# Patient Record
Sex: Male | Born: 1980 | Race: Black or African American | Hispanic: No | Marital: Single
Health system: Southern US, Academic
[De-identification: ages and names within clinical notes are randomized; demographics above are authoritative.]

## PROBLEM LIST (undated history)

## (undated) ENCOUNTER — Ambulatory Visit

## (undated) ENCOUNTER — Encounter

## (undated) DIAGNOSIS — R079 Chest pain, unspecified: Secondary | ICD-10-CM

## (undated) DIAGNOSIS — B20 Human immunodeficiency virus [HIV] disease: Secondary | ICD-10-CM

## (undated) DIAGNOSIS — T7840XA Allergy, unspecified, initial encounter: Secondary | ICD-10-CM

## (undated) DIAGNOSIS — Z8249 Family history of ischemic heart disease and other diseases of the circulatory system: Secondary | ICD-10-CM

## (undated) DIAGNOSIS — F419 Anxiety disorder, unspecified: Secondary | ICD-10-CM

## (undated) DIAGNOSIS — K5900 Constipation, unspecified: Secondary | ICD-10-CM

## (undated) DIAGNOSIS — G4730 Sleep apnea, unspecified: Secondary | ICD-10-CM

## (undated) DIAGNOSIS — Z91018 Allergy to other foods: Secondary | ICD-10-CM

## (undated) DIAGNOSIS — E739 Lactose intolerance, unspecified: Secondary | ICD-10-CM

## (undated) DIAGNOSIS — M549 Dorsalgia, unspecified: Secondary | ICD-10-CM

## (undated) DIAGNOSIS — E786 Lipoprotein deficiency: Secondary | ICD-10-CM

## (undated) DIAGNOSIS — R7303 Prediabetes: Secondary | ICD-10-CM

## (undated) DIAGNOSIS — D573 Sickle-cell trait: Secondary | ICD-10-CM

## (undated) DIAGNOSIS — I10 Essential (primary) hypertension: Secondary | ICD-10-CM

## (undated) DIAGNOSIS — F1010 Alcohol abuse, uncomplicated: Secondary | ICD-10-CM

## (undated) DIAGNOSIS — J302 Other seasonal allergic rhinitis: Secondary | ICD-10-CM

## (undated) DIAGNOSIS — E7800 Pure hypercholesterolemia, unspecified: Secondary | ICD-10-CM

## (undated) HISTORY — DX: Anxiety disorder, unspecified: F41.9

## (undated) HISTORY — DX: Prediabetes: R73.03

## (undated) HISTORY — DX: Constipation, unspecified: K59.00

## (undated) HISTORY — DX: Dorsalgia, unspecified: M54.9

## (undated) HISTORY — DX: Alcohol abuse, uncomplicated: F10.10

## (undated) HISTORY — DX: Pure hypercholesterolemia, unspecified: E78.00

## (undated) HISTORY — DX: Human immunodeficiency virus (HIV) disease: B20

## (undated) HISTORY — DX: Other seasonal allergic rhinitis: J30.2

## (undated) HISTORY — DX: Sleep apnea, unspecified: G47.30

## (undated) HISTORY — DX: Lactose intolerance, unspecified: E73.9

## (undated) HISTORY — DX: Chest pain, unspecified: R07.9

## (undated) HISTORY — DX: Lipoprotein deficiency: E78.6

## (undated) HISTORY — DX: Family history of ischemic heart disease and other diseases of the circulatory system: Z82.49

## (undated) HISTORY — DX: Sickle-cell trait: D57.3

## (undated) HISTORY — DX: Allergy, unspecified, initial encounter: T78.40XA

## (undated) HISTORY — DX: Allergy to other foods: Z91.018

---
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
## 2002-08-25 ENCOUNTER — Emergency Department (HOSPITAL_COMMUNITY): Admission: EM | Admit: 2002-08-25 | Discharge: 2002-08-25 | Payer: Self-pay | Admitting: Emergency Medicine

## 2002-08-25 ENCOUNTER — Encounter: Payer: Self-pay | Admitting: Emergency Medicine

## 2002-11-25 DIAGNOSIS — I10 Essential (primary) hypertension: Secondary | ICD-10-CM

## 2002-11-25 HISTORY — DX: Essential (primary) hypertension: I10

## 2004-01-02 ENCOUNTER — Emergency Department (HOSPITAL_COMMUNITY): Admission: EM | Admit: 2004-01-02 | Discharge: 2004-01-03 | Payer: Self-pay | Admitting: Emergency Medicine

## 2004-01-21 ENCOUNTER — Emergency Department (HOSPITAL_COMMUNITY): Admission: AD | Admit: 2004-01-21 | Discharge: 2004-01-21 | Payer: Self-pay | Admitting: Family Medicine

## 2004-04-24 ENCOUNTER — Emergency Department (HOSPITAL_COMMUNITY): Admission: EM | Admit: 2004-04-24 | Discharge: 2004-04-24 | Payer: Self-pay | Admitting: Family Medicine

## 2005-01-28 ENCOUNTER — Emergency Department (HOSPITAL_COMMUNITY): Admission: EM | Admit: 2005-01-28 | Discharge: 2005-01-28 | Payer: Self-pay | Admitting: Family Medicine

## 2005-11-19 ENCOUNTER — Emergency Department (HOSPITAL_COMMUNITY): Admission: EM | Admit: 2005-11-19 | Discharge: 2005-11-19 | Payer: Self-pay | Admitting: Family Medicine

## 2005-11-20 ENCOUNTER — Emergency Department (HOSPITAL_COMMUNITY): Admission: EM | Admit: 2005-11-20 | Discharge: 2005-11-20 | Payer: Self-pay | Admitting: Family Medicine

## 2006-03-07 ENCOUNTER — Emergency Department (HOSPITAL_COMMUNITY): Admission: EM | Admit: 2006-03-07 | Discharge: 2006-03-07 | Payer: Self-pay | Admitting: Emergency Medicine

## 2006-11-08 ENCOUNTER — Emergency Department (HOSPITAL_COMMUNITY): Admission: EM | Admit: 2006-11-08 | Discharge: 2006-11-08 | Payer: Self-pay | Admitting: Family Medicine

## 2006-12-31 ENCOUNTER — Emergency Department (HOSPITAL_COMMUNITY): Admission: EM | Admit: 2006-12-31 | Discharge: 2006-12-31 | Payer: Self-pay | Admitting: Family Medicine

## 2007-06-15 ENCOUNTER — Emergency Department (HOSPITAL_COMMUNITY): Admission: EM | Admit: 2007-06-15 | Discharge: 2007-06-15 | Payer: Self-pay | Admitting: Emergency Medicine

## 2007-08-19 ENCOUNTER — Emergency Department (HOSPITAL_COMMUNITY): Admission: AC | Admit: 2007-08-19 | Discharge: 2007-08-20 | Payer: Self-pay

## 2007-10-09 ENCOUNTER — Emergency Department (HOSPITAL_COMMUNITY): Admission: EM | Admit: 2007-10-09 | Discharge: 2007-10-09 | Payer: Self-pay | Admitting: Emergency Medicine

## 2007-12-19 ENCOUNTER — Emergency Department (HOSPITAL_COMMUNITY): Admission: EM | Admit: 2007-12-19 | Discharge: 2007-12-19 | Payer: Self-pay | Admitting: Emergency Medicine

## 2008-10-25 ENCOUNTER — Emergency Department (HOSPITAL_COMMUNITY): Admission: EM | Admit: 2008-10-25 | Discharge: 2008-10-25 | Payer: Self-pay | Admitting: Emergency Medicine

## 2009-02-24 ENCOUNTER — Emergency Department (HOSPITAL_COMMUNITY): Admission: EM | Admit: 2009-02-24 | Discharge: 2009-02-24 | Payer: Self-pay | Admitting: Family Medicine

## 2010-07-17 ENCOUNTER — Ambulatory Visit: Payer: Self-pay | Admitting: Family Medicine

## 2011-01-24 ENCOUNTER — Inpatient Hospital Stay (INDEPENDENT_AMBULATORY_CARE_PROVIDER_SITE_OTHER)
Admission: RE | Admit: 2011-01-24 | Discharge: 2011-01-24 | Disposition: A | Discharge status: Home / Self Care | Payer: BC Managed Care – PPO | Source: Ambulatory Visit | Attending: Family Medicine | Admitting: Family Medicine

## 2011-01-24 DIAGNOSIS — S335XXA Sprain of ligaments of lumbar spine, initial encounter: Secondary | ICD-10-CM
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
## 2011-09-03 LAB — POCT URINALYSIS DIP (DEVICE)
Glucose, UA: NEGATIVE
Ketones, ur: NEGATIVE
Operator id: 239701
Protein, ur: NEGATIVE
Specific Gravity, Urine: 1.015
Urobilinogen, UA: 1

## 2011-09-03 LAB — HIV ANTIBODY (ROUTINE TESTING W REFLEX): HIV: NONREACTIVE

## 2011-09-03 LAB — RPR: RPR Ser Ql: NONREACTIVE

## 2011-09-05 LAB — TYPE AND SCREEN

## 2011-09-05 LAB — CBC
HCT: 43.5
Hemoglobin: 14.5
MCHC: 33.3
MCV: 81.7
RBC: 5.33
RDW: 14.1 — ABNORMAL HIGH

## 2011-09-05 LAB — I-STAT 8, (EC8 V) (CONVERTED LAB)
Acid-base deficit: 4 — ABNORMAL HIGH
BUN: 10
Chloride: 106
HCT: 49
Hemoglobin: 16.7
Operator id: 277751
Potassium: 3.2 — ABNORMAL LOW
Sodium: 142
pCO2, Ven: 39.8 — ABNORMAL LOW

## 2011-09-05 LAB — POCT I-STAT CREATININE: Creatinine, Ser: 1.2

## 2012-11-25 DIAGNOSIS — Z21 Asymptomatic human immunodeficiency virus [HIV] infection status: Secondary | ICD-10-CM

## 2012-11-25 DIAGNOSIS — B20 Human immunodeficiency virus [HIV] disease: Secondary | ICD-10-CM

## 2012-11-25 HISTORY — DX: Asymptomatic human immunodeficiency virus (hiv) infection status: Z21

## 2012-11-25 HISTORY — DX: Human immunodeficiency virus (HIV) disease: B20

## 2012-12-09 ENCOUNTER — Encounter: Payer: BC Managed Care – PPO | Admitting: Medical

## 2013-02-08 ENCOUNTER — Encounter (HOSPITAL_COMMUNITY): Payer: Self-pay | Admitting: *Deleted

## 2013-02-08 ENCOUNTER — Emergency Department (INDEPENDENT_AMBULATORY_CARE_PROVIDER_SITE_OTHER)
Admission: EM | Admit: 2013-02-08 | Discharge: 2013-02-08 | Disposition: A | Discharge status: Home / Self Care | Payer: Commercial Managed Care - PPO | Source: Home / Self Care | Attending: Emergency Medicine | Admitting: Emergency Medicine

## 2013-02-08 DIAGNOSIS — N390 Urinary tract infection, site not specified: Secondary | ICD-10-CM

## 2013-02-08 HISTORY — DX: Essential (primary) hypertension: I10

## 2013-02-08 LAB — POCT I-STAT, CHEM 8
BUN: 12 mg/dL (ref 6–23)
Calcium, Ion: 1.2 mmol/L (ref 1.12–1.23)
Chloride: 102 mEq/L (ref 96–112)
Creatinine, Ser: 1.1 mg/dL (ref 0.50–1.35)

## 2013-02-08 LAB — POCT URINALYSIS DIP (DEVICE)
Ketones, ur: 40 mg/dL — AB
Leukocytes, UA: NEGATIVE
Specific Gravity, Urine: 1.02 (ref 1.005–1.030)
pH: 6.5 (ref 5.0–8.0)

## 2013-02-08 MED ORDER — CEPHALEXIN 500 MG PO CAPS: 500.0000 mg | ORAL_CAPSULE | Freq: Three times a day (TID) | ORAL | Status: DC

## 2013-02-08 NOTE — ED Notes (Signed)
Pt  Has  Symptoms  Of   Dark  Urine       X  4  Days  Possibly  Blood   -  Pt  Has  A  History of  htn and  May  Be  Non  Compliant   With  His  meds   He  Is  In no acute  Distress       He  denys  Any  Pain

## 2013-02-08 NOTE — ED Provider Notes (Signed)
Chief Complaint  Patient presents with  . 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 He states his urine is amber in color. He denies any clots. He's had no dysuria, frequency, urgency, decrease in his stream, or urinary hesitancy. He denies any fever, chills, nausea, vomiting, back pain, or abdominal pain. No prior history of urinary tract infection or stone.  Review of Systems:  Other than noted above, the patient denies any of the following symptoms: General:  No fevers, chills, sweats, aches, or fatigue. GI:  No abdominal pain, back pain, nausea, vomiting, diarrhea, or constipation. GU:  No dysuria, frequency, urgency, hematuria, urethral discharge, penile lesions, penile pain, testicular pain, swelling, or mass, inguinal lymphadenopathy or incontinence.  PMFSH:  Past medical history, family history, social history, meds, and allergies were reviewed.  Physical Exam:   Vital signs:  BP 158/106  Pulse 72  Temp(Src) 98.6 F (37 C) (Oral)  Resp 16  SpO2 100% Gen:  Alert, oriented, in no distress. Lungs:  Clear to auscultation, no wheezes, rales or rhonchi. Heart:  Regular rhythm, no gallop or murmer. Abdomen:  Flat and soft.  No tenderness to palpation, guarding, or rebound.  No hepato-splenomegaly or mass.  Bowel sounds were normally active.  No hernia. Genital exam:  Was completely normal, no urethral discharge or bleeding, no lesions on the penis, no testicular pain or swelling, no inguinal lymphadenopathy. Back:  No CVA tenderness.  Skin:  Clear, warm and dry.  Labs:   Results for orders placed during the hospital encounter of 02/08/13  POCT URINALYSIS DIP (DEVICE)      Result Value Range   Glucose, UA NEGATIVE  NEGATIVE mg/dL   Bilirubin Urine MODERATE (*) NEGATIVE   Ketones, ur 40 (*) NEGATIVE mg/dL   Specific Gravity, Urine 1.020  1.005 - 1.030   Hgb urine dipstick  LARGE (*) NEGATIVE   pH 6.5  5.0 - 8.0   Protein, ur 100 (*) NEGATIVE mg/dL   Urobilinogen, UA 2.0 (*) 0.0 - 1.0 mg/dL   Nitrite POSITIVE (*) NEGATIVE   Leukocytes, UA NEGATIVE  NEGATIVE  POCT I-STAT, CHEM 8      Result Value Range   Sodium 143  135 - 145 mEq/L   Potassium 3.6  3.5 - 5.1 mEq/L   Chloride 102  96 - 112 mEq/L   BUN 12  6 - 23 mg/dL   Creatinine, Ser 1.61  0.50 - 1.35 mg/dL   Glucose, Bld 92  70 - 99 mg/dL   Calcium, Ion 0.96  1.12 - 1.23 mmol/L   TCO2 31  0 - 100 mmol/L   Hemoglobin 18.0 (*) 13.0 - 17.0 g/dL   HCT 04.5 (*) 40.9 - 81.1 %    Other Labs Obtained at Urgent Care Center:  Urine culture was obtained.  Results are pending at this time and we will call about any positive results.  Assessment: The encounter diagnosis was UTI (lower urinary tract infection).   Plan:   1.  The following meds were prescribed:   Discharge Medication List as of 02/08/2013  6:02 PM    START taking these medications   Details  cephALEXin (KEFLEX) 500 MG capsule Take 1 capsule (500 mg total) by mouth 3 (three) times daily., Starting 02/08/2013, Until Discontinued, Normal       2.  The patient was instructed in symptomatic care and handouts  were given. I told the patient to see a primary care physician in 2 or 3 weeks for a followup urinalysis and culture. He will need to get 2 negative urinalyses without any blood before we can say this is completely cleared up. If you're of the 2 urines show any blood he will need to see a urologist.  The patient was told that the differential diagnosis includes heart disease, and that it was of the utmost importance to followup with the specialist to whom he was referred. He was also told to followup with her primary care physician with regard to his blood pressure. His blood pressure today was high, but he has not been taking his lisinopril if he is supposed to be. 3.  The patient was told to return if becoming worse in any way, if no better in 3 or 4  days, and given some red flag symptoms that would indicate earlier return.     Reuben Likes, MD 02/08/13 [phone]

## 2013-02-09 LAB — URINE CULTURE
Colony Count: NO GROWTH
Culture: NO GROWTH
Special Requests: NORMAL

## 2013-04-13 ENCOUNTER — Encounter: Payer: Self-pay | Admitting: Medical

## 2013-04-13 ENCOUNTER — Ambulatory Visit (INDEPENDENT_AMBULATORY_CARE_PROVIDER_SITE_OTHER): Payer: 59 | Admitting: Medical

## 2013-04-13 VITALS — BP 122/80 | HR 68 | Temp 98.3°F | Resp 16 | Wt 191.0 lb

## 2013-04-13 DIAGNOSIS — F17200 Nicotine dependence, unspecified, uncomplicated: Secondary | ICD-10-CM

## 2013-04-13 DIAGNOSIS — Z8249 Family history of ischemic heart disease and other diseases of the circulatory system: Secondary | ICD-10-CM

## 2013-04-13 DIAGNOSIS — I10 Essential (primary) hypertension: Secondary | ICD-10-CM

## 2013-04-13 LAB — BASIC METABOLIC PANEL
BUN: 17 mg/dL (ref 6–23)
CO2: 27 mEq/L (ref 19–32)
Glucose, Bld: 97 mg/dL (ref 70–99)
Potassium: 4 mEq/L (ref 3.5–5.3)
Sodium: 135 mEq/L (ref 135–145)

## 2013-04-13 MED ORDER — LISINOPRIL 10 MG PO TABS: 10.0000 mg | ORAL_TABLET | Freq: Every day | ORAL | Status: DC

## 2013-04-13 MED ORDER — HYDROCHLOROTHIAZIDE 25 MG PO TABS: 25.0000 mg | ORAL_TABLET | Freq: Every day | ORAL | Status: DC

## 2013-04-13 NOTE — Patient Instructions (Signed)
Smoking Hazards Smoking cigarettes is extremely bad for your health. Tobacco smoke has over 200 known poisons in it. There are over 60 chemicals in tobacco smoke that cause cancer. Some of the chemicals found in cigarette smoke include:   Cyanide.  Benzene.  Formaldehyde.  Methanol (wood alcohol).  Acetylene (fuel used in welding torches).  Ammonia. Cigarette smoke also contains the poisonous gases nitrogen oxide and carbon monoxide.  Cigarette smokers have an increased risk of many serious medical problems, including:  Lung cancer.  Lung disease (such as pneumonia, bronchitis, and emphysema).  Heart attack and chest pain due to the heart not getting enough oxygen (angina).  Heart disease and peripheral blood vessel disease.  Hypertension.  Stroke.  Oral cancer (cancer of the lip, mouth, or voice box).  Bladder cancer.  Pancreatic cancer.  Cervical cancer.  Pregnancy complications, including premature birth.  Low birthweight babies.  Early menopause.  Lower estrogen level for women.  Infertility.  Facial wrinkles.  Blindness.  Increased risk of broken bones (fractures).  Senile dementia.  Stillbirths and smaller newborn babies, birth defects, and genetic damage to sperm.  Stomach ulcers and internal bleeding. Children of smokers have an increased risk of the following, because of secondhand smoke exposure:   Sudden infant death syndrome (SIDS).  Respiratory infections.  Lung cancer.  Heart disease.  Ear infections. Smoking causes approximately:  90% of all lung cancer deaths in men.  80% of all lung cancer deaths in women.  90% of deaths from chronic obstructive lung disease. Compared with nonsmokers, smoking increases the risk of:  Coronary heart disease by 2 to 4 times.  Stroke by 2 to 4 times.  Men developing lung cancer by 23 times.  Women developing lung cancer by 13 times.  Dying from chronic obstructive lung diseases by 12  times. Someone who smokes 2 packs a day loses about 8 years of his or her life. Even smoking lightly shortens your life expectancy by several years. You can greatly reduce the risk of medical problems for you and your family by stopping now. Smoking is the most preventable cause of death and disease in our society. Within days of quitting smoking, your circulation returns to normal, you decrease the risk of having a heart attack, and your lung capacity improves. There may be some increased phlegm in the first few days after quitting, and it may take months for your lungs to clear up completely. Quitting for 10 years cuts your lung cancer risk to almost that of a nonsmoker. WHY IS SMOKING ADDICTIVE?  Nicotine is the chemical agent in tobacco that is capable of causing addiction or dependence.  When you smoke and inhale, nicotine is absorbed rapidly into the bloodstream through your lungs. Nicotine absorbed through the lungs is capable of creating a powerful addiction. Both inhaled and non-inhaled nicotine may be addictive.  Addiction studies of cigarettes and spit tobacco show that addiction to nicotine occurs mainly during the teen years, when young people begin using tobacco products. WHAT ARE THE BENEFITS OF QUITTING?  There are many health benefits to quitting smoking.   Likelihood of developing cancer and heart disease decreases. Health improvements are seen almost immediately.  Blood pressure, pulse rate, and breathing patterns start returning to normal soon after quitting.  People who quit may see an improvement in their overall quality of life. Some people choose to quit all at once. Other options include nicotine replacement products, such as patches, gum, and nasal sprays. Do not use these  products without first checking with your caregiver. QUITTING SMOKING It is not easy to quit smoking. Nicotine is addicting, and longtime habits are hard to change. To start, you can write down all your  reasons for quitting, tell your family and friends you want to quit, and ask for their help. Throw your cigarettes away, chew gum or cinnamon sticks, keep your hands busy, and drink extra water or juice. Go for walks and practice deep breathing to relax. Think of all the money you are saving: around $1,000 a year, for the average pack-a-day smoker. Nicotine patches and gum have been shown to improve success at efforts to stop smoking. Zyban (bupropion) is an anti-depressant drug that can be prescribed to reduce nicotine withdrawal symptoms and to suppress the urge to smoke. Smoking is an addiction with both physical and psychological effects. Joining a stop-smoking support group can help you cope with the emotional issues. For more information and advice on programs to stop smoking, call your doctor, your local hospital, or these organizations:  American Lung Association - 1-800-LUNGUSA  American Cancer Society - 1-800-ACS-2345 Document Released: 12/19/2004 Document Revised: 02/03/2012 Document Reviewed: 08/23/2009 [facility] Patient Information 2013 [place], [place].   Hypertension As your heart beats, it forces blood through your arteries. This force is your blood pressure. If the pressure is too high, it is called hypertension (HTN) or high blood pressure. HTN is dangerous because you may have it and not know it. High blood pressure may mean that your heart has to work harder to pump blood. Your arteries may be narrow or stiff. The extra work puts you at risk for heart disease, stroke, and other problems.  Blood pressure consists of two numbers, a higher number over a lower, 110/72, for example. It is stated as "110 over 72." The ideal is below 120 for the top number (systolic) and under 80 for the bottom (diastolic). Write down your blood pressure today. You should pay close attention to your blood pressure if you have certain conditions such as:  Heart failure.  Prior heart  attack.  Diabetes  Chronic kidney disease.  Prior stroke.  Multiple risk factors for heart disease. To see if you have HTN, your blood pressure should be measured while you are seated with your arm held at the level of the heart. It should be measured at least twice. A one-time elevated blood pressure reading (especially in the Emergency Department) does not mean that you need treatment. There may be conditions in which the blood pressure is different between your right and left arms. It is important to see your caregiver soon for a recheck. Most people have essential hypertension which means that there is not a specific cause. This type of high blood pressure may be lowered by changing lifestyle factors such as:  Stress.  Smoking.  Lack of exercise.  Excessive weight.  Drug/tobacco/alcohol use.  Eating less salt. Most people do not have symptoms from high blood pressure until it has caused damage to the body. Effective treatment can often prevent, delay or reduce that damage. TREATMENT  When a cause has been identified, treatment for high blood pressure is directed at the cause. There are a large number of medications to treat HTN. These fall into several categories, and your caregiver will help you select the medicines that are best for you. Medications may have side effects. You should review side effects with your caregiver. If your blood pressure stays high after you have made lifestyle changes or started on  medicines,   Your medication(s) may need to be changed.  Other problems may need to be addressed.  Be certain you understand your prescriptions, and know how and when to take your medicine.  Be sure to follow up with your caregiver within the time frame advised (usually within two weeks) to have your blood pressure rechecked and to review your medications.  If you are taking more than one medicine to lower your blood pressure, make sure you know how and at what times they  should be taken. Taking two medicines at the same time can result in blood pressure that is too low. SEEK IMMEDIATE MEDICAL CARE IF:  You develop a severe headache, blurred or changing vision, or confusion.  You have unusual weakness or numbness, or a faint feeling.  You have severe chest or abdominal pain, vomiting, or breathing problems. MAKE SURE YOU:   Understand these instructions.  Will watch your condition.  Will get help right away if you are not doing well or get worse. Document Released: 11/11/2005 Document Revised: 02/03/2012 Document Reviewed: 07/01/2008 [facility] Patient Information 2013 [place], [place].

## 2013-04-13 NOTE — Progress Notes (Signed)
Subjective:  Douglas [name] is a 32 y.o. male who presents to re-establish.   Use to see [place], but when he ran out of his insurance, ended up going to [facility] for a while.   He needs refills on his BP medication Lisinopril and HCTZ.   Been on this combination 245mo.  He had diagnosis back in 2011 but never started BP medication until 245mo when his mom freaked out about the potential for stroke.  He is a smoker.  Drinks 6 x 40oz beers on the weekends.  Checks BPs twice daily.  Getting 130/72 or less.  No other aggravating or relieving factors.   Donate plasma 2 x week.  Got a letter from them stating that he has established with a provider to monitor his BP and that he is ok to resume plasma donations.  His letter shows recent BPs prior to 245mo with DBP above 90 regularly.   He has hx/o 2 UTIs.  One episode was visible blood.  Most recent 42mo ago.   No hx/o STD.  Last testing for STD a month ago. Had full panel, and he reports normal results.     No other c/o.  The following portions of the patient's history were reviewed and updated as appropriate: allergies, current medications, past family history, past medical history, past social history, past surgical history and problem list.  ROS Otherwise as in subjective above  Objective: Physical Exam  Vital signs reviewed  General appearance: alert, no distress, WD/WN, AA male Neck: supple, no lymphadenopathy, no thyromegaly, no masses Heart: RRR, normal S1, S2, no murmurs Lungs: CTA bilaterally, no wheezes, rhonchi, or rales Abdomen: +bs, soft, non tender, non distended, no masses, no hepatomegaly, no splenomegaly Pulses: 2+ radial pulses, 2+ pedal pulses, normal cap refill Ext: no edema   Adult ECG Report  Indication: HTN diagnosed under 30yo, baseline EKG  Rate: 66 bpm  Rhythm: normal sinus rhythm  QRS Axis: 68 degrees  PR Interval:  QRS Duration: 76ms  QTc:  Conduction Disturbances: none  Other  Abnormalities: right atrial enlargement  Patient's cardiac risk factors are: family history of premature cardiovascular disease, hypertension, male gender and smoking/ tobacco exposure.  EKG comparison: none  Narrative Interpretation: right atrial enlargement, otherwise normal EKG    Assessment: Encounter Diagnoses  Name Primary?  . Essential hypertension, benign Yes  . Tobacco use disorder   . Family history of premature CAD     Plan: Looking back, he was diagnosed here with HTN 2011, has prior normal thyroid, cholesterol and chemistry labs.   He never started medication at that time.  BMET labs today, baseline EKG, and c/t current medications.  discussed importance of compliance, good BP control, smoking cessation, limit alcohol and salt, exercising daily.  Wrote letter on his behalf for plasma center.  Follow up: pending labs.

## 2013-04-21 ENCOUNTER — Encounter: Payer: Self-pay | Admitting: Family Medicine

## 2013-06-16 ENCOUNTER — Encounter: Payer: Self-pay | Admitting: Medical

## 2013-06-16 ENCOUNTER — Ambulatory Visit (INDEPENDENT_AMBULATORY_CARE_PROVIDER_SITE_OTHER): Payer: 59 | Admitting: Medical

## 2013-06-16 VITALS — BP 142/90 | HR 92 | Temp 98.5°F | Resp 16 | Wt 191.0 lb

## 2013-06-16 DIAGNOSIS — K629 Disease of anus and rectum, unspecified: Secondary | ICD-10-CM

## 2013-06-16 DIAGNOSIS — K602 Anal fissure, unspecified: Secondary | ICD-10-CM

## 2013-06-16 DIAGNOSIS — K6289 Other specified diseases of anus and rectum: Secondary | ICD-10-CM

## 2013-06-16 MED ORDER — DOCUSATE SODIUM 100 MG PO CAPS: 100.0000 mg | ORAL_CAPSULE | Freq: Two times a day (BID) | ORAL | Status: DC

## 2013-06-16 MED ORDER — HYDROCORTISONE 2.5 % RE CREA: TOPICAL_CREAM | Freq: Two times a day (BID) | RECTAL | Status: DC

## 2013-06-16 NOTE — Patient Instructions (Signed)
Anal Fissure, Adult  An anal fissure is a small tear or crack in the skin around the anus. Bleeding from a fissure usually stops on its own within a few minutes. However, bleeding will often reoccur with each bowel movement until the crack heals.   CAUSES    Passing large, hard stools.   Frequent diarrheal stools.   Constipation.   Inflammatory bowel disease (Crohn's disease or ulcerative colitis).   Infections.   Anal sex.  SYMPTOMS    Small amounts of blood seen on your stools, on toilet paper, or in the toilet after a bowel movement.   Rectal bleeding.   Painful bowel movements.   Itching or irritation around the anus.  DIAGNOSIS  Your caregiver will examine the anal area. An anal fissure can usually be seen with careful inspection. A rectal exam may be performed and a short tube (anoscope) may be used to examine the anal canal.  TREATMENT    You may be instructed to take fiber supplements. These supplements can soften your stool to help make bowel movements easier.   Sitz baths may be recommended to help heal the tear. Do not use soap in the sitz baths.   A medicated cream or ointment may be prescribed to lessen discomfort.  HOME CARE INSTRUCTIONS    Maintain a diet high in fruits, whole grains, and vegetables. Avoid constipating foods like bananas and dairy products.   Take sitz baths as directed by your caregiver.   Drink enough fluids to keep your urine clear or pale yellow.   Only take over-the-counter or prescription medicines for pain, discomfort, or fever as directed by your caregiver. Do not take aspirin as this may increase bleeding.   Do not use ointments containing numbing medications (anesthetics) or hydrocortisone. They could slow healing.  SEEK MEDICAL CARE IF:    Your fissure is not completely healed within 3 days.   You have further bleeding.   You have a fever.   You have diarrhea mixed with blood.   You have pain.   Your problem is getting worse rather than  better.  MAKE SURE YOU:    Understand these instructions.   Will watch your condition.   Will get help right away if you are not doing well or get worse.  Document Released: 11/11/2005 Document Revised: 02/03/2012 Document Reviewed: 04/28/2011  [employer] Patient Information 2014 [employer].

## 2013-06-16 NOTE — Progress Notes (Signed)
Subjective: Here for rectal c/o.  Few weeks ago had severe pain with BMs, to the point of making him cry.  He had seen lumps at the anus.  Used OTC cream, preparation H with some relief.  Denies hx/o hemorrhoids.   Is having some leakage at the rectum.   Has seen some bright red blood on stool and on toilet paper.  Been having this problem x 2 wk.  He denies a lot of straining in general, but had 1 hard to pass BM few weeks ago.  Participates in anal sex.  Has BM once daily typically.   Has only been constipated 2 x ever.    Past Medical History  Diagnosis Date  . Hypertension    ROS as in subjective  Objective: Gen: wd, wn, nad Abdomen: +bs, soft, nontender, no mass, no organomegaly Rectum: right lateral anus with ulcerated/fissured area, raw appearing, other smaller fissures noted, no obvious hemorrhoids, and there is some slight mucoid seepage.    Assessment: Encounter Diagnoses  Name Primary?  Marland Kitchen Anal fissure Yes  . Anal lesion    Plan: Advised of findings.  Begin SITZ baths, proctosol cream, colace for stool softener.  Avoid anal intercourse for now.   Give this some time to heal.  F/u in 2wk, sooner prn.  Handout given

## 2013-06-17 ENCOUNTER — Ambulatory Visit: Payer: Commercial Managed Care - PPO

## 2013-06-17 DIAGNOSIS — B20 Human immunodeficiency virus [HIV] disease: Secondary | ICD-10-CM

## 2013-06-17 DIAGNOSIS — I10 Essential (primary) hypertension: Secondary | ICD-10-CM

## 2013-06-17 LAB — CBC WITH DIFFERENTIAL/PLATELET
Basophils Absolute: 0.1 10*3/uL (ref 0.0–0.1)
Basophils Relative: 2 % — ABNORMAL HIGH (ref 0–1)
Eosinophils Absolute: 0.1 10*3/uL (ref 0.0–0.7)
Eosinophils Relative: 2 % (ref 0–5)
HCT: 43.6 % (ref 39.0–52.0)
MCHC: 35.3 g/dL (ref 30.0–36.0)
MCV: 76.2 fL — ABNORMAL LOW (ref 78.0–100.0)
Monocytes Absolute: 1.2 10*3/uL — ABNORMAL HIGH (ref 0.1–1.0)
RDW: 15.6 % — ABNORMAL HIGH (ref 11.5–15.5)

## 2013-06-17 LAB — COMPLETE METABOLIC PANEL WITH GFR
AST: 15 U/L (ref 0–37)
Alkaline Phosphatase: 67 U/L (ref 39–117)
BUN: 11 mg/dL (ref 6–23)
Calcium: 9.6 mg/dL (ref 8.4–10.5)
Creat: 1.05 mg/dL (ref 0.50–1.35)
Total Bilirubin: 0.5 mg/dL (ref 0.3–1.2)

## 2013-06-17 LAB — LIPID PANEL
Cholesterol: 128 mg/dL (ref 0–200)
HDL: 29 mg/dL — ABNORMAL LOW (ref >39)
Total CHOL/HDL Ratio: 4.4 Ratio
Triglycerides: 76 mg/dL (ref <150)
VLDL: 15 mg/dL (ref 0–40)

## 2013-06-17 LAB — HEPATITIS B SURFACE ANTIGEN: Hepatitis B Surface Ag: NEGATIVE

## 2013-06-17 LAB — RPR TITER: RPR Titer: 1:1 {titer}

## 2013-06-17 LAB — RPR: RPR Ser Ql: REACTIVE — AB

## 2013-06-17 LAB — HEPATITIS C ANTIBODY: HCV Ab: NEGATIVE

## 2013-06-18 LAB — HIV-1 RNA ULTRAQUANT REFLEX TO GENTYP+: HIV-1 RNA Quant, Log: 5.7 {Log} — ABNORMAL HIGH (ref <1.30)

## 2013-06-18 LAB — URINALYSIS
Bilirubin Urine: NEGATIVE
Glucose, UA: NEGATIVE mg/dL
Leukocytes, UA: NEGATIVE
Protein, ur: NEGATIVE mg/dL
Specific Gravity, Urine: 1.015 (ref 1.005–1.030)
pH: 6 (ref 5.0–8.0)

## 2013-06-18 LAB — T.PALLIDUM AB, TOTAL: T pallidum Antibodies (TP-PA): 1.63 S/CO — ABNORMAL HIGH (ref <0.90)

## 2013-06-18 LAB — HEPATITIS B CORE ANTIBODY, TOTAL: Hep B Core Total Ab: POSITIVE — AB

## 2013-06-18 LAB — HEPATITIS B SURFACE ANTIBODY,QUALITATIVE: Hep B S Ab: POSITIVE — AB

## 2013-06-22 ENCOUNTER — Encounter: Payer: Self-pay | Admitting: Internal Medicine

## 2013-06-25 DIAGNOSIS — B20 Human immunodeficiency virus [HIV] disease: Secondary | ICD-10-CM | POA: Insufficient documentation

## 2013-06-25 DIAGNOSIS — I10 Essential (primary) hypertension: Secondary | ICD-10-CM | POA: Insufficient documentation

## 2013-06-25 NOTE — Progress Notes (Signed)
Pt tested negative for HIV in April, 2014. He donates plasma on a regular basis.  AT last plasma donation attempt testing was noted as positive. He then went to [facility] Department to repeat test which was positve in July, 2014. He is a [employer] Employee who wishes to have his medical records not accessible to co-workers.  I will check with IT to add protective measures.  Douglas [name] states he is over the initial shock of this diagnosis and ready to start treatment.   Vaccines given at [facility].   Laurell Josephs, RN

## 2013-06-28 LAB — HIV-1 GENOTYPR PLUS

## 2013-06-30 ENCOUNTER — Ambulatory Visit: Payer: 59 | Admitting: Medical

## 2013-06-30 ENCOUNTER — Ambulatory Visit (INDEPENDENT_AMBULATORY_CARE_PROVIDER_SITE_OTHER): Payer: 59 | Admitting: Medical

## 2013-06-30 ENCOUNTER — Encounter: Payer: Self-pay | Admitting: Medical

## 2013-06-30 VITALS — BP 112/80 | HR 100 | Temp 98.2°F | Resp 18 | Wt 186.0 lb

## 2013-06-30 DIAGNOSIS — S36899A Unspecified injury of other intra-abdominal organs, initial encounter: Secondary | ICD-10-CM

## 2013-06-30 DIAGNOSIS — B20 Human immunodeficiency virus [HIV] disease: Secondary | ICD-10-CM

## 2013-06-30 DIAGNOSIS — K602 Anal fissure, unspecified: Secondary | ICD-10-CM

## 2013-06-30 DIAGNOSIS — A539 Syphilis, unspecified: Secondary | ICD-10-CM

## 2013-06-30 DIAGNOSIS — S31839A Unspecified open wound of anus, initial encounter: Secondary | ICD-10-CM

## 2013-06-30 NOTE — Progress Notes (Signed)
Subjective: Here for recheck on wound at anus.   I saw him 06/17/13 for few week hx/o severe pain with BMs, to the point of making him cry.  He had seen lumps at the anus.  Used OTC cream, preparation H with some relief.  Denies hx/o hemorrhoids.   Continues to have some leakage at the rectum.   Has seen some bright red blood on stool and on toilet paper.  He denies a lot of straining in general, but had 1 hard to pass BM few weeks ago.  Participates in anal sex.  Has BM once daily typically.   Has only been constipated 2 x ever.  Since last visit has used some SITZ baths, proctosol cream, colace, but not much improvement.   He is HIV positive and was just notified recently of +syphilis test, has been given round of Penicillin.    Past Medical History  Diagnosis Date  . Hypertension   HIV+, followed by infectious disease clinic Syphilis + as of 7/14, treated with Penicillin  ROS as in subjective  Objective: Gen: wd, wn, nad Rectum: right lateral anus with ulcerated/fissured area, approximately dime size area, raw appearing, inferior small fissure is healed, no obvious hemorrhoids, and there is some slight mucoid seepage.    Assessment: Encounter Diagnoses  Name Primary?  . Open wound of anus, initial encounter Yes  . Anal fissure   . HIV disease   . Syphilis    Plan: Dr. Susann Givens supervising physician also examined patient.  Fissure healed, but the larger wound is not any better.  Referral to general surgery for evaluation.   For now, c/t SITZ baths, proctosol cream, colace for stool softener.  Avoid anal intercourse. F/u with general surgery.   Of note, he is followed by Infectious disease, has been treated for syphilis recently.

## 2013-07-05 ENCOUNTER — Encounter: Payer: Self-pay | Admitting: Medical

## 2013-07-06 ENCOUNTER — Encounter: Payer: Self-pay | Admitting: Internal Medicine

## 2013-07-07 ENCOUNTER — Ambulatory Visit (INDEPENDENT_AMBULATORY_CARE_PROVIDER_SITE_OTHER): Payer: Commercial Managed Care - PPO | Admitting: General Surgery

## 2013-07-07 ENCOUNTER — Encounter (INDEPENDENT_AMBULATORY_CARE_PROVIDER_SITE_OTHER): Payer: Self-pay | Admitting: General Surgery

## 2013-07-07 VITALS — BP 112/68 | HR 88 | Resp 16 | Ht 72.0 in | Wt 188.0 lb

## 2013-07-07 DIAGNOSIS — K602 Anal fissure, unspecified: Secondary | ICD-10-CM

## 2013-07-07 NOTE — Progress Notes (Signed)
Subjective:     Patient ID: Douglas [name], male   DOB: August 05, 1981, 32 y.o.   MRN: [id number]  HPI The patient is a 32 year old male who is referred by Dr. Aleen Campi secondary to anal fissure. Patient states that this started approximately 4 weeks ago. He states he has very painful bowel movements but has slowly gotten better. The patient was started on stool softeners as well as Proctosol cream. He states that this really worked to help the pain.  This also notes some passage of mucus  The patient is also HIV positive who she sees infectious disease. Patient also participates in anal intercourse. He states that this episode began after the last anal intercourse. Review of Systems  Constitutional: Negative.   HENT: Negative.   Respiratory: Negative.   Cardiovascular: Negative.   Gastrointestinal: Positive for anal bleeding.  All other systems reviewed and are negative.       Objective:   Physical Exam  Constitutional: He is oriented to person, place, and time. He appears well-developed and well-nourished.  HENT:  Head: Normocephalic and atraumatic.  Eyes: Conjunctivae and EOM are normal. Pupils are equal, round, and reactive to light.  Neck: Normal range of motion. Neck supple.  Cardiovascular: Normal rate, regular rhythm and normal heart sounds.   Pulmonary/Chest: Effort normal and breath sounds normal.  Abdominal: Soft. Bowel sounds are normal.  Genitourinary: Rectum normal.     Musculoskeletal: Normal range of motion.  Neurological: He is alert and oriented to person, place, and time.       Assessment:     32 year old male with anal fissure     Plan:     1. Patient prescription for diltiazem cream. He should continue this for 2 weeks. I discussed with him to continue with sitz baths, and avoid anal intercourse at this time.  2. We'll have patient follow back up in 2 weeks

## 2013-07-08 ENCOUNTER — Telehealth: Payer: Self-pay | Admitting: *Deleted

## 2013-07-08 ENCOUNTER — Encounter: Payer: Self-pay | Admitting: Internal Medicine

## 2013-07-08 ENCOUNTER — Ambulatory Visit (INDEPENDENT_AMBULATORY_CARE_PROVIDER_SITE_OTHER): Payer: Commercial Managed Care - PPO | Admitting: Internal Medicine

## 2013-07-08 ENCOUNTER — Telehealth (INDEPENDENT_AMBULATORY_CARE_PROVIDER_SITE_OTHER): Payer: Self-pay

## 2013-07-08 ENCOUNTER — Other Ambulatory Visit: Payer: Self-pay | Admitting: *Deleted

## 2013-07-08 VITALS — BP 138/83 | HR 108 | Temp 97.8°F | Ht 72.0 in | Wt 187.0 lb

## 2013-07-08 DIAGNOSIS — B20 Human immunodeficiency virus [HIV] disease: Secondary | ICD-10-CM

## 2013-07-08 DIAGNOSIS — Z23 Encounter for immunization: Secondary | ICD-10-CM

## 2013-07-08 MED ORDER — ELVITEG-COBIC-EMTRICIT-TENOFDF 150-150-200-300 MG PO TABS: 1.0000 | ORAL_TABLET | Freq: Every day | ORAL | Status: DC

## 2013-07-08 NOTE — Telephone Encounter (Signed)
Received a call from [name] about his prescription for Stribild.  He said he could not pay the Co-pay and out-of-pocket.  It would be $1750.00.  I called [employer] to see what it would cost.  They would not tell me anything because the prior approval had not been done yet.  I called [facility] to see what it would cost him there.  The co-pay will be between $25 and $50 per month.  Vestal will be able to pick up his medication tomorrow at the Outpatient pharmacy.

## 2013-07-08 NOTE — Telephone Encounter (Signed)
Called and notified patient that his FMLA papers were complete. He will pick up today, copies made and put up front for scanning. Douglas [name]

## 2013-07-08 NOTE — Assessment & Plan Note (Signed)
I discussed different options, side effects and he has opted for Stribild.  He will start now and return in 4 weeks for labs and appt 2 weeks after labs.  I discussed side effects, monitoring, importance of compliance. All questions answered.   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 65 minutes spent with 35 minutes of patient contact including exam, counseling.

## 2013-07-08 NOTE — Progress Notes (Signed)
  Subjective:    Patient ID: Douglas [name], male    DOB: 04-08-1981, 32 y.o.   MRN: [id number]  HPI Here as a new patient with HIV.  Had intake.  NEgative in April of 2014 and then found positive with plasma donation later and confirmed with health department.  CD4 270, viral load 500,000.  MSM.  Has a history of syphilis, no herpes.  Is followed by surgery for anal fistula.  Has no concerns regarding HIV and is accepting of diagnosis.  Is ready to start meds and feels he will be able to take daily.  On HTN meds.  No weight loss, no diarrhea.     Review of Systems  Constitutional: Negative for fever, chills, fatigue and unexpected weight change.  HENT: Negative for sore throat and trouble swallowing.   Eyes: Negative for visual disturbance.  Respiratory: Negative for cough and shortness of breath.   Cardiovascular: Negative for chest pain and leg swelling.  Gastrointestinal: Negative for nausea, abdominal pain and diarrhea.  Genitourinary: Negative for discharge and genital sores.  Musculoskeletal: Negative for myalgias and arthralgias.  Skin: Negative for rash.  Neurological: Negative for dizziness, light-headedness and headaches.  Hematological: Negative for adenopathy.  Psychiatric/Behavioral: Negative for dysphoric mood.       Objective:   Physical Exam  Constitutional: He is oriented to person, place, and time. He appears well-developed and well-nourished. No distress.  HENT:  Mouth/Throat: Oropharynx is clear and moist. No oropharyngeal exudate.  Eyes: Right eye exhibits no discharge. Left eye exhibits no discharge. No scleral icterus.  Cardiovascular: Normal rate, regular rhythm and normal heart sounds.   No murmur heard. Pulmonary/Chest: Effort normal and breath sounds normal. No respiratory distress. He has no wheezes. He has no rales.  Abdominal: Soft. Bowel sounds are normal. He exhibits no distension. There is no tenderness. There is no rebound.  Genitourinary: Penis  normal.  Lymphadenopathy:    He has no cervical adenopathy.  Neurological: He is alert and oriented to person, place, and time.  Skin: Skin is warm and dry.  Psychiatric: He has a normal mood and affect.          Assessment & Plan:

## 2013-07-08 NOTE — Telephone Encounter (Signed)
Pt calling to inform us that Rx amount called into [facility] pharmacy per Dr. Derrell Lolling exceeds the amount they will cover.  This CMA called [facility] pharmacy and they confirmed.  I called the patient back and he is going to research further with his insurance company.  He will call us back if necessary.

## 2013-07-30 ENCOUNTER — Encounter (INDEPENDENT_AMBULATORY_CARE_PROVIDER_SITE_OTHER): Payer: Commercial Managed Care - PPO | Admitting: General Surgery

## 2013-08-05 ENCOUNTER — Other Ambulatory Visit: Payer: Commercial Managed Care - PPO

## 2013-08-05 DIAGNOSIS — B20 Human immunodeficiency virus [HIV] disease: Secondary | ICD-10-CM

## 2013-08-05 LAB — COMPLETE METABOLIC PANEL WITH GFR
ALT: 22 U/L (ref 0–53)
AST: 17 U/L (ref 0–37)
Alkaline Phosphatase: 54 U/L (ref 39–117)
Creat: 1.06 mg/dL (ref 0.50–1.35)
Sodium: 138 mEq/L (ref 135–145)
Total Bilirubin: 0.5 mg/dL (ref 0.3–1.2)
Total Protein: 7.2 g/dL (ref 6.0–8.3)

## 2013-08-06 LAB — CBC WITH DIFFERENTIAL/PLATELET
Basophils Absolute: 0.1 10*3/uL (ref 0.0–0.1)
Eosinophils Absolute: 0.2 10*3/uL (ref 0.0–0.7)
Eosinophils Relative: 4 % (ref 0–5)
Lymphs Abs: 1.5 10*3/uL (ref 0.7–4.0)
MCH: 27.1 pg (ref 26.0–34.0)
MCV: 79.4 fL (ref 78.0–100.0)
Neutrophils Relative %: 56 % (ref 43–77)
Platelets: 306 10*3/uL (ref 150–400)
RBC: 5.1 MIL/uL (ref 4.22–5.81)
RDW: 15.9 % — ABNORMAL HIGH (ref 11.5–15.5)
WBC: 5.8 10*3/uL (ref 4.0–10.5)

## 2013-08-06 LAB — T-HELPER CELL (CD4) - (RCID CLINIC ONLY)
CD4 % Helper T Cell: 33 % (ref 33–55)
CD4 T Cell Abs: 470 /uL (ref 400–2700)

## 2013-08-09 LAB — HIV-1 RNA QUANT-NO REFLEX-BLD: HIV-1 RNA Quant, Log: 2.48 {Log} — ABNORMAL HIGH (ref <1.30)

## 2013-08-19 ENCOUNTER — Ambulatory Visit (INDEPENDENT_AMBULATORY_CARE_PROVIDER_SITE_OTHER): Payer: Commercial Managed Care - PPO | Admitting: Internal Medicine

## 2013-08-19 ENCOUNTER — Encounter: Payer: Self-pay | Admitting: Internal Medicine

## 2013-08-19 VITALS — BP 134/81 | HR 74 | Temp 98.1°F | Ht 72.0 in | Wt 189.0 lb

## 2013-08-19 DIAGNOSIS — B20 Human immunodeficiency virus [HIV] disease: Secondary | ICD-10-CM

## 2013-08-19 DIAGNOSIS — L219 Seborrheic dermatitis, unspecified: Secondary | ICD-10-CM

## 2013-08-19 NOTE — Progress Notes (Signed)
  Subjective:    Patient ID: Douglas [name], male    DOB: [birth year], 32 y.o.   MRN: [id number]  HPI He is here for routine followup. He was seen as a new patient last visit newly diagnosed and started on Stribild. He has been taken Stribild for over a month and denies any missed doses. No problems with the medication including no diarrhea, weight loss. He does continue to have a facial rash which has notably increased and is more pruritic. Also some discoloration under his nail beds of his thumbs.   Review of Systems  Constitutional: Negative for fever and fatigue.  HENT: Negative for sore throat and trouble swallowing.   Eyes: Negative for visual disturbance.  Respiratory: Negative for cough and shortness of breath.   Cardiovascular: Negative for leg swelling.  Gastrointestinal: Negative for nausea, abdominal pain and diarrhea.  Musculoskeletal: Negative for myalgias and arthralgias.  Skin: Positive for rash.  Neurological: Negative for dizziness, light-headedness and headaches.  Hematological: Negative for adenopathy.  Psychiatric/Behavioral: Negative for dysphoric mood.       Objective:   Physical Exam  Constitutional: He is oriented to person, place, and time. He appears well-developed and well-nourished. No distress.  HENT:  Mouth/Throat: No oropharyngeal exudate.  Eyes: No scleral icterus.  Cardiovascular: Normal rate, regular rhythm and normal heart sounds.   No murmur heard. Pulmonary/Chest: Effort normal and breath sounds normal. No respiratory distress.  Lymphadenopathy:    He has no cervical adenopathy.  Neurological: He is alert and oriented to person, place, and time.  Skin: Rash noted.  Facial rash  Psychiatric: He has a normal mood and affect. His behavior is normal.          Assessment & Plan:

## 2013-08-19 NOTE — Assessment & Plan Note (Signed)
His rash seems consistent with seborrheic dermatitis. It has worsened with his immune her constitution but suspect it will improve once his levels have stabilized. He is going to try selenium sulfide shampoo

## 2013-08-19 NOTE — Assessment & Plan Note (Signed)
He is doing very well and has had good response with his medication. Good tolerance. He will return in 3 months for routine followup.

## 2013-08-20 ENCOUNTER — Encounter: Payer: 59 | Admitting: Medical

## 2013-09-06 ENCOUNTER — Ambulatory Visit (INDEPENDENT_AMBULATORY_CARE_PROVIDER_SITE_OTHER): Payer: 59 | Admitting: Medical

## 2013-09-06 ENCOUNTER — Encounter: Payer: Self-pay | Admitting: Medical

## 2013-09-06 VITALS — BP 128/90 | HR 68 | Temp 98.3°F | Resp 16 | Ht 73.5 in | Wt 193.0 lb

## 2013-09-06 DIAGNOSIS — Z21 Asymptomatic human immunodeficiency virus [HIV] infection status: Secondary | ICD-10-CM

## 2013-09-06 DIAGNOSIS — Z0000 Encounter for general adult medical examination without abnormal findings: Secondary | ICD-10-CM

## 2013-09-06 DIAGNOSIS — Z91013 Allergy to seafood: Secondary | ICD-10-CM

## 2013-09-06 DIAGNOSIS — E786 Lipoprotein deficiency: Secondary | ICD-10-CM

## 2013-09-06 DIAGNOSIS — I10 Essential (primary) hypertension: Secondary | ICD-10-CM

## 2013-09-06 DIAGNOSIS — B20 Human immunodeficiency virus [HIV] disease: Secondary | ICD-10-CM

## 2013-09-06 LAB — POCT URINALYSIS DIPSTICK
Bilirubin, UA: NEGATIVE
Glucose, UA: NEGATIVE
Leukocytes, UA: NEGATIVE
Nitrite, UA: NEGATIVE

## 2013-09-06 MED ORDER — HYDROCHLOROTHIAZIDE 25 MG PO TABS: 25.0000 mg | ORAL_TABLET | Freq: Every day | ORAL | Status: DC

## 2013-09-06 MED ORDER — EPINEPHRINE 0.3 MG/0.3ML IJ SOAJ: 0.3000 mg | Freq: Once | INTRAMUSCULAR | Status: DC

## 2013-09-06 MED ORDER — LISINOPRIL 10 MG PO TABS: 10.0000 mg | ORAL_TABLET | Freq: Every day | ORAL | Status: DC

## 2013-09-06 NOTE — Progress Notes (Signed)
Subjective:   HPI  Douglas [name] is a 32 y.o. male who presents for a complete physical.  Medical care team includes:  Infectious disease - HIV infection  Primary care - Kristian Covey PA-C with Dr. Susann Givens   Preventative care: Last ophthalmology visit:N/A Last dental visit:N/A Last colonoscopy:N/A Last prostate exam: N/A Last EKGN/A: Last labs:2014  Prior vaccinations: TD or Tdap:2014 Influenza:WILL GET AT WORK Pneumococcal:N/A Shingles/Zostavax:N/A  Advanced directive:N/A Health care power of attorney:N/A Living will:N/A  Concerns: None. Ran out of BP medication few weeks ago.  Reviewed their medical, surgical, family, social, medication, and allergy history and updated chart as appropriate.  Past Medical History  Diagnosis Date  . Hypertension   . HIV infection   . Seasonal allergic rhinitis     weather changes, spring/fall  . Low HDL (under 40)   . Family history of heart disease in male family member before age 76     History reviewed. No pertinent past surgical history.  History   Social History  . Marital Status: Single    Spouse Name: N/A    Number of Children: N/A  . Years of Education: N/A   Occupational History  . Not on file.   Social History Main Topics  . Smoking status: Former Smoker -- 0.50 packs/day    Start date: 09/03/2013  . Smokeless tobacco: Never Used     Comment: trying cold [place] - maybe patch  . Alcohol Use: 6.0 oz/week    5 Cans of beer, 5 Shots of liquor per week     Comment: 240 oz on weekends  . Drug Use: No  . Sexual Activity: Yes    Partners: Male    Birth Control/ Protection: Condom   Other Topics Concern  . Not on file   Social History Narrative   CNA, exercise - cardio.      Family History  Problem Relation Age of Onset  . Hypertension Mother   . Stroke Mother   . Heart disease Father 49    died of MI  . Diabetes Father   . Hypertension Sister   . Diabetes Sister   . Heart disease Paternal  Uncle     several uncles died in 33s with MI  . Hypertension Paternal Uncle   . Hypertension Maternal Grandmother   . Stroke Maternal Grandmother   . Hypertension Maternal Grandfather   . Cancer Paternal Grandmother     lung    Current outpatient prescriptions:elvitegravir-cobicistat-emtricitabine-tenofovir (STRIBILD) 150-150-200-300 MG TABS tablet, Take 1 tablet by mouth daily., Disp: 30 tablet, Rfl: 5;  hydrochlorothiazide (HYDRODIURIL) 25 MG tablet, Take 1 tablet (25 mg total) by mouth daily., Disp: 30 tablet, Rfl: 11;  lisinopril (PRINIVIL,ZESTRIL) 10 MG tablet, Take 1 tablet (10 mg total) by mouth daily., Disp: 30 tablet, Rfl: 11 EPINEPHrine (EPIPEN) 0.3 mg/0.3 mL SOAJ injection, Inject 0.3 mLs (0.3 mg total) into the muscle once., Disp: 1 Device, Rfl: 0;  hydrocortisone (PROCTOSOL HC) 2.5 % rectal cream, Place rectally 2 (two) times daily., Disp: 30 g, Rfl: 0  Allergies  Allergen Reactions  . Shellfish Allergy     Review of Systems Constitutional: -fever, -chills, -sweats, -unexpected weight change, -decreased appetite, -fatigue Allergy: -sneezing, -itching, -congestion Dermatology: -changing moles, --rash, -lumps ENT: -runny nose, -ear pain, -sore throat, -hoarseness, -sinus pain, -teeth pain, - ringing in ears, -hearing loss, -nosebleeds Cardiology: -chest pain, -palpitations, -swelling, -difficulty breathing when lying flat, -waking up short of breath Respiratory: -cough, -shortness of breath, -difficulty breathing with exercise  or exertion, -wheezing, -coughing up blood Gastroenterology: -abdominal pain, -nausea, -vomiting, -diarrhea, -constipation, -blood in stool, -changes in bowel movement, -difficulty swallowing or eating Hematology: -bleeding, -bruising  Musculoskeletal: -joint aches, -muscle aches, -joint swelling, +back pain, -neck pain, -cramping, -changes in gait Ophthalmology: denies vision changes, eye redness, itching, discharge Urology: -burning with urination,  -difficulty urinating, -blood in urine, -urinary frequency, -urgency, -incontinence Neurology: -headache, -weakness, -tingling, -numbness, -memory loss, -falls, -dizziness Psychology: -depressed mood, -agitation, -sleep problems     Objective:   Physical Exam  BP 128/90  Pulse 68  Temp(Src) 98.3 F (36.8 C) (Oral)  Resp 16  Ht 6' 1.5" (1.867 m)  Wt 193 lb (87.544 kg)  BMI 25.12 kg/m2  General appearance: alert, no distress, WD/WN, lean AA male Skin: few scattered benign appearing lesions, no worrisome lesions HEENT: normocephalic, conjunctiva/corneas normal, sclerae anicteric, PERRLA, EOMi, nares patent, no discharge or erythema, pharynx normal Oral cavity: MMM, tongue normal, teeth in good repair Neck: supple, no lymphadenopathy, no thyromegaly, no masses, normal ROM, no bruits Chest: non tender, normal shape and expansion Heart: RRR, normal S1, S2, no murmurs Lungs: CTA bilaterally, no wheezes, rhonchi, or rales Abdomen: +bs, soft, non tender, non distended, no masses, no hepatomegaly, no splenomegaly, no bruits Back: non tender, normal ROM, no scoliosis Musculoskeletal: upper extremities non tender, no obvious deformity, normal ROM throughout, lower extremities non tender, no obvious deformity, normal ROM throughout Extremities: no edema, no cyanosis, no clubbing Pulses: 2+ symmetric, upper and lower extremities, normal cap refill Neurological: alert, oriented x 3, CN2-12 intact, strength normal upper extremities and lower extremities, sensation normal throughout, DTRs 2+ throughout, no cerebellar signs, gait normal Psychiatric: normal affect, behavior normal, pleasant  GU: normal male external genitalia, circumcised, nontender, no masses, no hernia, no lymphadenopathy Rectal: deferred   Assessment and Plan :      Encounter Diagnoses  Name Primary?  . Routine general medical examination at a health care facility Yes  . Essential hypertension, benign   . Low HDL (under  40)   . HIV infection   . Shellfish allergy     Physical exam - discussed healthy lifestyle, diet, exercise, preventative care, vaccinations, and addressed their concerns.   See eye doctor, dentist for yearly f/u. HTN - c/t same medications, come in for routine f/u, don't let scripts expire.   Low HDL - discussed diet, consider medications, but no medication begun at this time.  Discussed heart disease risks and risk factor reduction.   HIV infection - managed by infectious disease, f/u as planned Shellfish allergy - discussed anaphylaxis, precautions, prescribed Epi pen and discussed proper use of medication. Follow-up with ID in December as planned.

## 2013-09-30 ENCOUNTER — Other Ambulatory Visit: Payer: Self-pay

## 2013-10-05 ENCOUNTER — Ambulatory Visit (INDEPENDENT_AMBULATORY_CARE_PROVIDER_SITE_OTHER): Payer: 59 | Admitting: Medical

## 2013-10-05 ENCOUNTER — Encounter: Payer: Self-pay | Admitting: Medical

## 2013-10-05 VITALS — BP 136/88 | HR 78 | Temp 97.9°F | Wt 191.0 lb

## 2013-10-05 DIAGNOSIS — R05 Cough: Secondary | ICD-10-CM

## 2013-10-05 DIAGNOSIS — R058 Other specified cough: Secondary | ICD-10-CM

## 2013-10-05 DIAGNOSIS — R059 Cough, unspecified: Secondary | ICD-10-CM

## 2013-10-05 MED ORDER — BENZONATATE 200 MG PO CAPS: 200.0000 mg | ORAL_CAPSULE | Freq: Two times a day (BID) | ORAL | Status: DC | PRN

## 2013-10-05 NOTE — Progress Notes (Signed)
Subjective:  Douglas [name] is a 32 y.o. male who presents for cough.  Had a cold/URI that started 9 days ago with runny nose, sneezing, sore throat, nasal congestion, then 4 days ago started coughing, but all the other URI symptoms have resolved.   Currently just has intermittent cough.  Denies fever, SOB, wheezing, productive cough, sinus pressure, headache, NVD, ear pain, no sick contacts.  Denies fall allergies.  Using robitussin, ginger chews.  Denies sick contacts.  No other aggravating or relieving factors.  No other c/o.  ROS as in subjective.   Objective: Filed Vitals:   10/05/13 1408  BP: 136/88  Pulse: 78  Temp: 97.9 F (36.6 C)    General appearance: Alert, WD/WN, no distress                             Skin: warm, no rash                           Head: no sinus tenderness                            Eyes: conjunctiva normal, corneas clear, PERRLA                            Ears: pearly TMs, external ear canals normal                          Nose: septum midline, turbinates swollen, with erythema and clear discharge             Mouth/throat: MMM, tongue normal, mild pharyngeal erythema                           Neck: supple, no adenopathy, no thyromegaly, nontender                          Heart: RRR, normal S1, S2, no murmurs                         Lungs: CTA bilaterally, no wheezes, rales, or rhonchi     Assessment: Encounter Diagnosis  Name Primary?  . Post-viral cough syndrome Yes     Plan: Discussed diagnosis which appears to be post viral cough.   No current exam findings or symptoms suggesting lung infection or other cause of cough.  Script for [employer], rest, hydrate well.  If not improving or worse in the next week, then call or return.

## 2013-11-22 ENCOUNTER — Ambulatory Visit: Payer: Commercial Managed Care - PPO | Admitting: Internal Medicine

## 2013-11-30 ENCOUNTER — Ambulatory Visit: Payer: Commercial Managed Care - PPO | Admitting: Internal Medicine

## 2013-12-28 ENCOUNTER — Ambulatory Visit: Payer: Commercial Managed Care - PPO | Admitting: Internal Medicine

## 2014-01-03 ENCOUNTER — Other Ambulatory Visit: Payer: Self-pay | Admitting: *Deleted

## 2014-01-03 ENCOUNTER — Other Ambulatory Visit: Payer: Self-pay | Admitting: Internal Medicine

## 2014-01-03 DIAGNOSIS — B20 Human immunodeficiency virus [HIV] disease: Secondary | ICD-10-CM

## 2014-01-03 MED ORDER — STRIBILD 150-150-200-300 MG PO TABS: ORAL_TABLET | ORAL | Status: DC

## 2014-01-06 ENCOUNTER — Telehealth: Payer: Self-pay | Admitting: *Deleted

## 2014-01-06 NOTE — Telephone Encounter (Signed)
Faxed [place]'s application for Stribild to [employer] today.

## 2014-01-18 ENCOUNTER — Ambulatory Visit: Payer: Commercial Managed Care - PPO | Admitting: Internal Medicine

## 2014-04-11 ENCOUNTER — Telehealth: Payer: Self-pay | Admitting: *Deleted

## 2014-04-11 NOTE — Telephone Encounter (Signed)
Received a fax from [employer].  [place]'s enrollment ends 04-06-14.  I called and left a message for Ladene ArtistDerrick to call me back.  I need to know if he got insurance or not.

## 2014-04-14 ENCOUNTER — Other Ambulatory Visit: Payer: BC Managed Care – PPO

## 2014-04-26 ENCOUNTER — Other Ambulatory Visit: Payer: BC Managed Care – PPO

## 2014-04-27 ENCOUNTER — Ambulatory Visit: Payer: BC Managed Care – PPO | Admitting: Internal Medicine

## 2014-04-30 ENCOUNTER — Other Ambulatory Visit: Payer: Self-pay | Admitting: Internal Medicine

## 2014-04-30 DIAGNOSIS — B20 Human immunodeficiency virus [HIV] disease: Secondary | ICD-10-CM

## 2014-05-02 MED ORDER — STRIBILD 150-150-200-300 MG PO TABS: ORAL_TABLET | ORAL | Status: DC

## 2014-05-03 ENCOUNTER — Other Ambulatory Visit: Payer: BC Managed Care – PPO

## 2014-05-03 DIAGNOSIS — B20 Human immunodeficiency virus [HIV] disease: Secondary | ICD-10-CM

## 2014-05-04 LAB — T-HELPER CELL (CD4) - (RCID CLINIC ONLY)
CD4 % Helper T Cell: 38 % (ref 33–55)
CD4 T Cell Abs: 620 /uL (ref 400–2700)

## 2014-05-06 LAB — HIV-1 RNA QUANT-NO REFLEX-BLD
HIV 1 RNA QUANT: 39 {copies}/mL — AB (ref <20)
HIV-1 RNA QUANT, LOG: 1.59 {Log} — AB (ref <1.30)

## 2014-05-11 ENCOUNTER — Ambulatory Visit: Payer: BC Managed Care – PPO | Admitting: Internal Medicine

## 2014-05-17 ENCOUNTER — Ambulatory Visit: Payer: BC Managed Care – PPO | Admitting: Internal Medicine

## 2014-06-01 ENCOUNTER — Ambulatory Visit: Payer: BC Managed Care – PPO | Admitting: Internal Medicine

## 2014-06-04 ENCOUNTER — Other Ambulatory Visit: Payer: Self-pay | Admitting: Internal Medicine

## 2014-06-06 ENCOUNTER — Other Ambulatory Visit: Payer: Self-pay | Admitting: *Deleted

## 2014-06-06 DIAGNOSIS — B20 Human immunodeficiency virus [HIV] disease: Secondary | ICD-10-CM

## 2014-06-06 MED ORDER — STRIBILD 150-150-200-300 MG PO TABS: ORAL_TABLET | ORAL | Status: DC

## 2014-06-23 ENCOUNTER — Telehealth: Payer: Self-pay | Admitting: *Deleted

## 2014-06-23 DIAGNOSIS — B20 Human immunodeficiency virus [HIV] disease: Secondary | ICD-10-CM

## 2014-06-23 MED ORDER — STRIBILD 150-150-200-300 MG PO TABS: ORAL_TABLET | ORAL | Status: DC

## 2014-06-23 NOTE — Telephone Encounter (Signed)
Pt needing to use Mail Order for specialty rxes.  [facility]

## 2014-06-23 NOTE — Telephone Encounter (Signed)
[facility] [place].  Left pt a message w/ telephone number to contact to set up delivery and account.

## 2014-07-07 ENCOUNTER — Telehealth: Payer: Self-pay | Admitting: *Deleted

## 2014-07-07 NOTE — Telephone Encounter (Signed)
Medication [facility] 06/23/14.  Pt has not received his Stribild rx.  Pt is out of medication.  Call to [employer] insurance.  Insurance computer system shows rx being processed today.  [facility] had computer system conversion 7/30-06/30/14.  Had not processed prescription.  Notified [facility] that the pt is out of medication and needs rx sent URGENTLY.  [facility] to "overnight" rx to the pt.  Notified pt that rx should arrive tomorrow.  [facility].  Left message that the pt should call [facility] if he needs co-pay card for Stribild to be reimbursed for rx expense.

## 2014-07-11 ENCOUNTER — Other Ambulatory Visit: Payer: Self-pay | Admitting: *Deleted

## 2014-07-11 DIAGNOSIS — B20 Human immunodeficiency virus [HIV] disease: Secondary | ICD-10-CM

## 2014-07-11 MED ORDER — STRIBILD 150-150-200-300 MG PO TABS: ORAL_TABLET | ORAL | Status: DC

## 2014-07-27 ENCOUNTER — Encounter: Payer: Self-pay | Admitting: Internal Medicine

## 2014-07-27 ENCOUNTER — Ambulatory Visit (INDEPENDENT_AMBULATORY_CARE_PROVIDER_SITE_OTHER): Payer: BC Managed Care – PPO | Admitting: Internal Medicine

## 2014-07-27 VITALS — BP 152/95 | HR 91 | Temp 98.2°F | Wt 184.0 lb

## 2014-07-27 DIAGNOSIS — I10 Essential (primary) hypertension: Secondary | ICD-10-CM

## 2014-07-27 DIAGNOSIS — B20 Human immunodeficiency virus [HIV] disease: Secondary | ICD-10-CM | POA: Diagnosis not present

## 2014-07-27 DIAGNOSIS — Z139 Encounter for screening, unspecified: Secondary | ICD-10-CM | POA: Insufficient documentation

## 2014-07-27 DIAGNOSIS — Z113 Encounter for screening for infections with a predominantly sexual mode of transmission: Secondary | ICD-10-CM

## 2014-07-27 DIAGNOSIS — Z79899 Other long term (current) drug therapy: Secondary | ICD-10-CM

## 2014-07-27 NOTE — Assessment & Plan Note (Signed)
Encouraged to quit smoking first and go back to his PCP to consider therapy.

## 2014-07-27 NOTE — Progress Notes (Signed)
  Subjective:    Patient ID: Douglas [name], male    DOB: June 22, 1981, 33 y.o.   MRN: [id number]  HPI He is here for routine followup. He was seen as a new patient last visit newly diagnosed and started on Stribild. He has been taken Stribild for over a month and denies any missed doses. No problems with the medication including no diarrhea, weight loss. He does continue to have a facial rash which has notably increased and is more pruritic. Also some discoloration under his nail beds of his thumbs.   Review of Systems  Constitutional: Negative for fever and fatigue.  HENT: Negative for sore throat and trouble swallowing.   Eyes: Negative for visual disturbance.  Respiratory: Negative for cough and shortness of breath.   Cardiovascular: Negative for leg swelling.  Gastrointestinal: Negative for nausea, abdominal pain and diarrhea.  Skin: Negative for rash.  Neurological: Negative for dizziness, light-headedness and headaches.       Objective:   Physical Exam  Constitutional: He appears well-developed and well-nourished. No distress.  HENT:  Mouth/Throat: No oropharyngeal exudate.  Eyes: No scleral icterus.  Cardiovascular: Normal rate, regular rhythm and normal heart sounds.   No murmur heard. Pulmonary/Chest: Effort normal and breath sounds normal. No respiratory distress.  Lymphadenopathy:    He has no cervical adenopathy.  Skin: No rash noted.          Assessment & Plan:

## 2014-07-27 NOTE — Assessment & Plan Note (Signed)
Doing good.  Did miss about 2 weeks so will have him come back next month and recheck labs.  rTC 8 weeks after labs.

## 2014-09-06 ENCOUNTER — Other Ambulatory Visit: Payer: BC Managed Care – PPO

## 2014-09-06 DIAGNOSIS — Z113 Encounter for screening for infections with a predominantly sexual mode of transmission: Secondary | ICD-10-CM

## 2014-09-06 DIAGNOSIS — Z79899 Other long term (current) drug therapy: Secondary | ICD-10-CM

## 2014-09-06 DIAGNOSIS — B20 Human immunodeficiency virus [HIV] disease: Secondary | ICD-10-CM

## 2014-09-06 LAB — CBC WITH DIFFERENTIAL/PLATELET
Basophils Absolute: 0 10*3/uL (ref 0.0–0.1)
Basophils Relative: 0 % (ref 0–1)
EOS ABS: 0.2 10*3/uL (ref 0.0–0.7)
Eosinophils Relative: 4 % (ref 0–5)
HCT: 44.6 % (ref 39.0–52.0)
Hemoglobin: 15.3 g/dL (ref 13.0–17.0)
LYMPHS ABS: 2.1 10*3/uL (ref 0.7–4.0)
LYMPHS PCT: 41 % (ref 12–46)
MCH: 28.3 pg (ref 26.0–34.0)
MCHC: 34.3 g/dL (ref 30.0–36.0)
MCV: 82.6 fL (ref 78.0–100.0)
Monocytes Absolute: 0.8 10*3/uL (ref 0.1–1.0)
Monocytes Relative: 16 % — ABNORMAL HIGH (ref 3–12)
NEUTROS PCT: 39 % — AB (ref 43–77)
Neutro Abs: 2 10*3/uL (ref 1.7–7.7)
PLATELETS: 224 10*3/uL (ref 150–400)
RBC: 5.4 MIL/uL (ref 4.22–5.81)
RDW: 14.6 % (ref 11.5–15.5)
WBC: 5 10*3/uL (ref 4.0–10.5)

## 2014-09-07 ENCOUNTER — Other Ambulatory Visit: Payer: Self-pay | Admitting: Internal Medicine

## 2014-09-07 ENCOUNTER — Other Ambulatory Visit: Payer: BC Managed Care – PPO

## 2014-09-07 ENCOUNTER — Other Ambulatory Visit: Payer: Self-pay | Admitting: *Deleted

## 2014-09-07 ENCOUNTER — Encounter: Payer: Self-pay | Admitting: *Deleted

## 2014-09-07 DIAGNOSIS — B20 Human immunodeficiency virus [HIV] disease: Secondary | ICD-10-CM

## 2014-09-07 LAB — COMPLETE METABOLIC PANEL WITH GFR
ALT: 10 U/L (ref 0–53)
AST: 12 U/L (ref 0–37)
Albumin: 4.3 g/dL (ref 3.5–5.2)
Alkaline Phosphatase: 68 U/L (ref 39–117)
BUN: 11 mg/dL (ref 6–23)
CALCIUM: 9.4 mg/dL (ref 8.4–10.5)
CO2: 24 meq/L (ref 19–32)
CREATININE: 0.99 mg/dL (ref 0.50–1.35)
Chloride: 102 mEq/L (ref 96–112)
GFR, Est Non African American: >89 mL/min
Glucose, Bld: 83 mg/dL (ref 70–99)
Potassium: 4.2 mEq/L (ref 3.5–5.3)
Sodium: 138 mEq/L (ref 135–145)
Total Bilirubin: 0.4 mg/dL (ref 0.2–1.2)
Total Protein: 7.1 g/dL (ref 6.0–8.3)

## 2014-09-07 LAB — LIPID PANEL
CHOL/HDL RATIO: 2.4 ratio
CHOLESTEROL: 121 mg/dL (ref 0–200)
HDL: 50 mg/dL (ref >39)
LDL Cholesterol: 60 mg/dL (ref 0–99)
Triglycerides: 56 mg/dL (ref <150)
VLDL: 11 mg/dL (ref 0–40)

## 2014-09-07 LAB — HIV-1 RNA QUANT-NO REFLEX-BLD
HIV 1 RNA Quant: <20 copies/mL (ref <20)
HIV-1 RNA Quant, Log: <1.3 {Log} (ref <1.30)

## 2014-09-07 LAB — T-HELPER CELL (CD4) - (RCID CLINIC ONLY)
CD4 % Helper T Cell: 31 % — ABNORMAL LOW (ref 33–55)
CD4 T Cell Abs: 640 /uL (ref 400–2700)

## 2014-09-07 LAB — URINE CYTOLOGY ANCILLARY ONLY
Chlamydia: NEGATIVE
Neisseria Gonorrhea: NEGATIVE

## 2014-09-07 LAB — RPR

## 2014-09-07 MED ORDER — STRIBILD 150-150-200-300 MG PO TABS: ORAL_TABLET | ORAL | Status: DC

## 2014-09-07 NOTE — Telephone Encounter (Signed)
Error

## 2014-09-07 NOTE — Telephone Encounter (Signed)
Called in the pt's refill to [facility] in [place].

## 2014-09-10 LAB — HLA B*5701: HLA-B*5701 w/rflx HLA-B High: NEGATIVE

## 2014-09-21 ENCOUNTER — Ambulatory Visit: Payer: BC Managed Care – PPO | Admitting: Internal Medicine

## 2014-09-29 ENCOUNTER — Ambulatory Visit: Payer: BC Managed Care – PPO | Admitting: Internal Medicine

## 2014-12-15 ENCOUNTER — Encounter: Payer: 59 | Admitting: Medical

## 2014-12-27 ENCOUNTER — Encounter: Payer: Self-pay | Admitting: Medical

## 2015-01-30 ENCOUNTER — Encounter: Payer: Self-pay | Admitting: Internal Medicine

## 2015-01-31 ENCOUNTER — Other Ambulatory Visit: Payer: Self-pay | Admitting: *Deleted

## 2015-01-31 ENCOUNTER — Other Ambulatory Visit: Payer: BLUE CROSS/BLUE SHIELD

## 2015-01-31 DIAGNOSIS — B20 Human immunodeficiency virus [HIV] disease: Secondary | ICD-10-CM

## 2015-02-01 LAB — T-HELPER CELL (CD4) - (RCID CLINIC ONLY)
CD4 T CELL ABS: 470 /uL (ref 400–2700)
CD4 T CELL HELPER: 31 % — AB (ref 33–55)

## 2015-02-02 LAB — HIV-1 RNA QUANT-NO REFLEX-BLD: HIV 1 RNA Quant: <20 copies/mL (ref <20)

## 2015-02-08 ENCOUNTER — Encounter: Payer: Self-pay | Admitting: Internal Medicine

## 2015-02-28 ENCOUNTER — Ambulatory Visit (INDEPENDENT_AMBULATORY_CARE_PROVIDER_SITE_OTHER): Payer: BLUE CROSS/BLUE SHIELD | Admitting: Medical

## 2015-02-28 ENCOUNTER — Encounter: Payer: Self-pay | Admitting: Medical

## 2015-02-28 VITALS — BP 160/102 | HR 80 | Temp 98.3°F | Resp 16 | Wt 222.4 lb

## 2015-02-28 DIAGNOSIS — I10 Essential (primary) hypertension: Secondary | ICD-10-CM

## 2015-02-28 DIAGNOSIS — F17200 Nicotine dependence, unspecified, uncomplicated: Secondary | ICD-10-CM

## 2015-02-28 DIAGNOSIS — Z720 Tobacco use: Secondary | ICD-10-CM

## 2015-02-28 DIAGNOSIS — B20 Human immunodeficiency virus [HIV] disease: Secondary | ICD-10-CM | POA: Diagnosis not present

## 2015-02-28 MED ORDER — LISINOPRIL-HYDROCHLOROTHIAZIDE 20-12.5 MG PO TABS: 1.0000 | ORAL_TABLET | Freq: Every day | ORAL | Status: DC

## 2015-02-28 MED ORDER — BUPROPION HCL ER (XL) 150 MG PO TB24: 150.0000 mg | ORAL_TABLET | Freq: Every day | ORAL | Status: DC

## 2015-02-28 NOTE — Progress Notes (Signed)
Subjective: Here for f/u on HTN.  Last visit here 2014.  He had run out of BP medication and ended up seeing a PA at his job, was given 2months worth of Lisionpril 10mg  to get him by until he saw us in recheck.    Overall doing fine without c/o.  Started to walk again for exercise.   Diet - try to eat low carb, not much on sweets or soda.  Not good about salt intake.  No other new c/o.  Compliant with HIV therapy, sees infectious disease.  No other aggravating or relieving factors. No other complaint.   Objective: Filed Vitals:   02/28/15 1330  BP: 160/102  Pulse: 80  Temp: 98.3 F (36.8 C)  Resp: 16   General appearance: alert, no distress, WD/WN Neck: supple, no lymphadenopathy, no thyromegaly, no masses, no bruits Heart: RRR, normal S1, S2, no murmurs Lungs: CTA bilaterally, no wheezes, rhonchi, or rales Pulses: 2+ symmetric, upper and lower extremities, normal cap refill Ext - no edema  Assessment: Encounter Diagnoses  Name Primary?  . Essential hypertension Yes  . Tobacco use disorder   . HIV disease     Plan: HTN - change to Lisinopril HCT, f/u 80mo Tobacco use - begin Wellbutrin and Nicotine patch, discussed risks/benefits of medication HIV disease - f/u by infectious disease

## 2015-04-18 ENCOUNTER — Ambulatory Visit: Payer: BLUE CROSS/BLUE SHIELD | Admitting: Medical

## 2015-04-18 ENCOUNTER — Telehealth: Payer: Self-pay | Admitting: Family Medicine

## 2015-04-18 NOTE — Telephone Encounter (Signed)
Send no show letter.  Call him if you can find another number, as to what happened this morning - no show 8:15am.

## 2015-04-18 NOTE — Telephone Encounter (Signed)
I tried to call this patient and the number in the system is not a working number.

## 2015-04-21 NOTE — Telephone Encounter (Signed)
This pt has had several no shows. He has already received one no show letter. Pt is up for dismissal. Please advise.

## 2015-04-25 NOTE — Telephone Encounter (Signed)
I'm fine with dismissal

## 2015-04-27 ENCOUNTER — Encounter: Payer: Self-pay | Admitting: Family Medicine

## 2015-05-04 ENCOUNTER — Encounter: Payer: Self-pay | Admitting: Internal Medicine

## 2015-05-04 ENCOUNTER — Other Ambulatory Visit: Payer: Self-pay | Admitting: Internal Medicine

## 2015-05-04 DIAGNOSIS — B20 Human immunodeficiency virus [HIV] disease: Secondary | ICD-10-CM

## 2015-05-08 ENCOUNTER — Other Ambulatory Visit: Payer: BLUE CROSS/BLUE SHIELD

## 2015-05-08 DIAGNOSIS — B20 Human immunodeficiency virus [HIV] disease: Secondary | ICD-10-CM

## 2015-05-08 LAB — COMPREHENSIVE METABOLIC PANEL
ALBUMIN: 4.6 g/dL (ref 3.5–5.2)
ALK PHOS: 65 U/L (ref 39–117)
ALT: 12 U/L (ref 0–53)
AST: 13 U/L (ref 0–37)
BUN: 14 mg/dL (ref 6–23)
CALCIUM: 10 mg/dL (ref 8.4–10.5)
CHLORIDE: 104 meq/L (ref 96–112)
CO2: 25 mEq/L (ref 19–32)
Creat: 1.18 mg/dL (ref 0.50–1.35)
Glucose, Bld: 120 mg/dL — ABNORMAL HIGH (ref 70–99)
POTASSIUM: 3.6 meq/L (ref 3.5–5.3)
SODIUM: 139 meq/L (ref 135–145)
Total Bilirubin: 0.4 mg/dL (ref 0.2–1.2)
Total Protein: 7.8 g/dL (ref 6.0–8.3)

## 2015-05-09 LAB — CBC WITH DIFFERENTIAL/PLATELET
Basophils Absolute: 0 10*3/uL (ref 0.0–0.1)
Basophils Relative: 0 % (ref 0–1)
EOS ABS: 0.2 10*3/uL (ref 0.0–0.7)
Eosinophils Relative: 4 % (ref 0–5)
HEMATOCRIT: 44.3 % (ref 39.0–52.0)
HEMOGLOBIN: 15.1 g/dL (ref 13.0–17.0)
LYMPHS ABS: 1.3 10*3/uL (ref 0.7–4.0)
LYMPHS PCT: 22 % (ref 12–46)
MCH: 28.7 pg (ref 26.0–34.0)
MCHC: 34.1 g/dL (ref 30.0–36.0)
MCV: 84.1 fL (ref 78.0–100.0)
MONOS PCT: 11 % (ref 3–12)
MPV: 9.7 fL (ref 8.6–12.4)
Monocytes Absolute: 0.6 10*3/uL (ref 0.1–1.0)
Neutro Abs: 3.6 10*3/uL (ref 1.7–7.7)
Neutrophils Relative %: 63 % (ref 43–77)
Platelets: 248 10*3/uL (ref 150–400)
RBC: 5.27 MIL/uL (ref 4.22–5.81)
RDW: 14.4 % (ref 11.5–15.5)
WBC: 5.7 10*3/uL (ref 4.0–10.5)

## 2015-05-09 LAB — HIV-1 RNA QUANT-NO REFLEX-BLD
HIV 1 RNA QUANT: 2714 {copies}/mL — AB (ref <20)
HIV-1 RNA QUANT, LOG: 3.43 {Log} — AB (ref <1.30)

## 2015-05-10 LAB — T-HELPER CELL (CD4) - (RCID CLINIC ONLY)
CD4 T CELL HELPER: 38 % (ref 33–55)
CD4 T Cell Abs: 480 /uL (ref 400–2700)

## 2015-05-15 ENCOUNTER — Encounter: Payer: Self-pay | Admitting: Internal Medicine

## 2015-05-18 ENCOUNTER — Encounter: Payer: Self-pay | Admitting: Internal Medicine

## 2015-06-26 ENCOUNTER — Telehealth: Payer: Self-pay | Admitting: *Deleted

## 2015-06-26 NOTE — Telephone Encounter (Signed)
RN contacted the patient at the number listed. A voicemail states the name of Douglas [name]. VM was not left at this time. Purpose of communication is offer the assistance of a Community Based Health Care Nurse and to get the patient in care with Dr Luciana Axe

## 2015-08-15 ENCOUNTER — Encounter: Payer: Self-pay | Admitting: Internal Medicine

## 2015-09-06 ENCOUNTER — Encounter: Payer: Self-pay | Admitting: Internal Medicine

## 2015-09-13 ENCOUNTER — Other Ambulatory Visit (INDEPENDENT_AMBULATORY_CARE_PROVIDER_SITE_OTHER): Payer: Self-pay

## 2015-09-13 ENCOUNTER — Ambulatory Visit: Payer: BLUE CROSS/BLUE SHIELD

## 2015-09-13 DIAGNOSIS — Z113 Encounter for screening for infections with a predominantly sexual mode of transmission: Secondary | ICD-10-CM

## 2015-09-13 DIAGNOSIS — B20 Human immunodeficiency virus [HIV] disease: Secondary | ICD-10-CM

## 2015-09-13 DIAGNOSIS — Z79899 Other long term (current) drug therapy: Secondary | ICD-10-CM

## 2015-09-13 LAB — CBC WITH DIFFERENTIAL/PLATELET
BASOS ABS: 0 10*3/uL (ref 0.0–0.1)
BASOS PCT: 0 % (ref 0–1)
EOS ABS: 0.3 10*3/uL (ref 0.0–0.7)
EOS PCT: 5 % (ref 0–5)
HCT: 42.8 % (ref 39.0–52.0)
Hemoglobin: 14.2 g/dL (ref 13.0–17.0)
LYMPHS ABS: 1.6 10*3/uL (ref 0.7–4.0)
Lymphocytes Relative: 30 % (ref 12–46)
MCH: 28.4 pg (ref 26.0–34.0)
MCHC: 33.2 g/dL (ref 30.0–36.0)
MCV: 85.6 fL (ref 78.0–100.0)
MONOS PCT: 11 % (ref 3–12)
MPV: 10.4 fL (ref 8.6–12.4)
Monocytes Absolute: 0.6 10*3/uL (ref 0.1–1.0)
NEUTROS PCT: 54 % (ref 43–77)
Neutro Abs: 2.9 10*3/uL (ref 1.7–7.7)
PLATELETS: 230 10*3/uL (ref 150–400)
RBC: 5 MIL/uL (ref 4.22–5.81)
RDW: 14.3 % (ref 11.5–15.5)
WBC: 5.3 10*3/uL (ref 4.0–10.5)

## 2015-09-13 LAB — COMPLETE METABOLIC PANEL WITH GFR
ALT: 25 U/L (ref 9–46)
AST: 20 U/L (ref 10–40)
Albumin: 4.3 g/dL (ref 3.6–5.1)
Alkaline Phosphatase: 59 U/L (ref 40–115)
BUN: 18 mg/dL (ref 7–25)
CHLORIDE: 105 mmol/L (ref 98–110)
CO2: 27 mmol/L (ref 20–31)
CREATININE: 1.04 mg/dL (ref 0.60–1.35)
Calcium: 9 mg/dL (ref 8.6–10.3)
GFR, Est African American: >89 mL/min (ref >=60)
GFR, Est Non African American: >89 mL/min (ref >=60)
GLUCOSE: 87 mg/dL (ref 65–99)
POTASSIUM: 4.1 mmol/L (ref 3.5–5.3)
SODIUM: 139 mmol/L (ref 135–146)
Total Bilirubin: 0.6 mg/dL (ref 0.2–1.2)
Total Protein: 7.3 g/dL (ref 6.1–8.1)

## 2015-09-13 LAB — LIPID PANEL
Cholesterol: 165 mg/dL (ref 125–200)
HDL: 59 mg/dL (ref >=40)
LDL CALC: 91 mg/dL (ref <130)
Total CHOL/HDL Ratio: 2.8 Ratio (ref <=5.0)
Triglycerides: 74 mg/dL (ref <150)
VLDL: 15 mg/dL (ref <30)

## 2015-09-14 LAB — RPR

## 2015-09-15 LAB — URINE CYTOLOGY ANCILLARY ONLY
CHLAMYDIA, DNA PROBE: NEGATIVE
NEISSERIA GONORRHEA: NEGATIVE

## 2015-09-15 LAB — T-HELPER CELL (CD4) - (RCID CLINIC ONLY)
CD4 % Helper T Cell: 41 % (ref 33–55)
CD4 T CELL ABS: 610 /uL (ref 400–2700)

## 2015-09-15 LAB — HIV-1 RNA QUANT-NO REFLEX-BLD: HIV 1 RNA Quant: <20 copies/mL (ref <20)

## 2015-11-22 ENCOUNTER — Other Ambulatory Visit: Payer: Self-pay | Admitting: *Deleted

## 2015-11-22 DIAGNOSIS — B20 Human immunodeficiency virus [HIV] disease: Secondary | ICD-10-CM

## 2015-11-22 MED ORDER — ELVITEG-COBIC-EMTRICIT-TENOFDF 150-150-200-300 MG PO TABS: ORAL_TABLET | ORAL | Status: DC

## 2015-11-28 ENCOUNTER — Ambulatory Visit: Payer: Self-pay

## 2015-12-15 ENCOUNTER — Other Ambulatory Visit: Payer: Self-pay | Admitting: Internal Medicine

## 2015-12-20 ENCOUNTER — Encounter: Payer: Self-pay | Admitting: Internal Medicine

## 2015-12-20 ENCOUNTER — Ambulatory Visit (INDEPENDENT_AMBULATORY_CARE_PROVIDER_SITE_OTHER): Payer: Self-pay | Admitting: Internal Medicine

## 2015-12-20 VITALS — BP 126/81 | HR 79 | Temp 98.0°F | Ht 73.0 in | Wt 240.0 lb

## 2015-12-20 DIAGNOSIS — Z113 Encounter for screening for infections with a predominantly sexual mode of transmission: Secondary | ICD-10-CM

## 2015-12-20 DIAGNOSIS — F17210 Nicotine dependence, cigarettes, uncomplicated: Secondary | ICD-10-CM | POA: Insufficient documentation

## 2015-12-20 DIAGNOSIS — Z87891 Personal history of nicotine dependence: Secondary | ICD-10-CM

## 2015-12-20 DIAGNOSIS — Z23 Encounter for immunization: Secondary | ICD-10-CM

## 2015-12-20 DIAGNOSIS — B20 Human immunodeficiency virus [HIV] disease: Secondary | ICD-10-CM

## 2015-12-20 DIAGNOSIS — Z79899 Other long term (current) drug therapy: Secondary | ICD-10-CM

## 2015-12-20 DIAGNOSIS — I10 Essential (primary) hypertension: Secondary | ICD-10-CM

## 2015-12-20 LAB — COMPREHENSIVE METABOLIC PANEL
ALK PHOS: 56 U/L (ref 40–115)
ALT: 23 U/L (ref 9–46)
AST: 17 U/L (ref 10–40)
Albumin: 4.6 g/dL (ref 3.6–5.1)
BILIRUBIN TOTAL: 0.4 mg/dL (ref 0.2–1.2)
BUN: 16 mg/dL (ref 7–25)
CALCIUM: 10.1 mg/dL (ref 8.6–10.3)
CO2: 26 mmol/L (ref 20–31)
CREATININE: 1.12 mg/dL (ref 0.60–1.35)
Chloride: 101 mmol/L (ref 98–110)
GLUCOSE: 88 mg/dL (ref 65–99)
Potassium: 3.9 mmol/L (ref 3.5–5.3)
SODIUM: 138 mmol/L (ref 135–146)
Total Protein: 7.9 g/dL (ref 6.1–8.1)

## 2015-12-20 LAB — CBC
HEMATOCRIT: 43.9 % (ref 39.0–52.0)
HEMOGLOBIN: 14.8 g/dL (ref 13.0–17.0)
MCH: 28.3 pg (ref 26.0–34.0)
MCHC: 33.7 g/dL (ref 30.0–36.0)
MCV: 83.9 fL (ref 78.0–100.0)
MPV: 10.1 fL (ref 8.6–12.4)
Platelets: 248 10*3/uL (ref 150–400)
RBC: 5.23 MIL/uL (ref 4.22–5.81)
RDW: 13.9 % (ref 11.5–15.5)
WBC: 6.9 10*3/uL (ref 4.0–10.5)

## 2015-12-20 LAB — LIPID PANEL
Cholesterol: 202 mg/dL — ABNORMAL HIGH (ref 125–200)
HDL: 45 mg/dL (ref >=40)
LDL CALC: 124 mg/dL (ref <130)
TRIGLYCERIDES: 166 mg/dL — AB (ref <150)
Total CHOL/HDL Ratio: 4.5 Ratio (ref <=5.0)
VLDL: 33 mg/dL — AB (ref <30)

## 2015-12-20 MED ORDER — ELVITEG-COBIC-EMTRICIT-TENOFAF 150-150-200-10 MG PO TABS: 1.0000 | ORAL_TABLET | Freq: Every day | ORAL | Status: DC

## 2015-12-20 NOTE — Assessment & Plan Note (Addendum)
He did have some reactivation of his viral load last summer when he was off of medication but he suppressed and was undetectable when last checked in October. I will repeat his lab work today and change Stribild to [employer]. I also asked him to take it with food. He stated that that would not be a problem and he would take it with breakfast each morning. I also encouraged him to keep regular visits. I filled out FMLA papers so that he would be able to get off of work for visits. I suggested a visit every 6 months and that he can have blood work drawn during the visit. He was also given a supply of condoms.

## 2015-12-20 NOTE — Progress Notes (Signed)
Patient Active Problem List   Diagnosis Date Noted  . Former smoker 12/20/2015  . Screening examination for venereal disease 07/27/2014  . Encounter for long-term (current) use of other medications 07/27/2014  . Seborrheic dermatitis 08/19/2013  . HTN (hypertension) 06/25/2013  . Human immunodeficiency virus (HIV) disease (HCC) 06/25/2013    Patient's Medications  New Prescriptions   ELVITEGRAVIR-COBICISTAT-EMTRICITABINE-TENOFOVIR (GENVOYA) 150-150-200-10 MG TABS TABLET    Take 1 tablet by mouth daily with breakfast.  Previous Medications   EPINEPHRINE (EPIPEN) 0.3 MG/0.3 ML SOAJ INJECTION    Inject 0.3 mLs (0.3 mg total) into the muscle once.   LISINOPRIL-HYDROCHLOROTHIAZIDE (ZESTORETIC) 20-12.5 MG PER TABLET    Take 1 tablet by mouth daily.  Modified Medications   No medications on file  Discontinued Medications   BUPROPION (WELLBUTRIN XL) 150 MG 24 HR TABLET    Take 1 tablet (150 mg total) by mouth daily.   ELVITEGRAVIR-COBICISTAT-EMTRICITABINE-TENOFOVIR (STRIBILD) 150-150-200-300 MG TABS TABLET    TAKE 1 TABLET BY MOUTH DAILY.    Subjective: Douglas [name] is in for his first visit since September 2015. He has been followed by my partner, Dr. Merceda Elks, for his HIV infection. He has had trouble with his car and also difficulty getting off of work which has made it difficult for him to keep regular visits. He has been on Stribild. He had problems with his mail order pharmacy and was out of it for 2-3 weeks last summer. Other than that he has not missed doses. He tolerates it well. He takes it each morning on an empty stomach. He was able to quit smoking cold [place] about 2 months ago. He states that he was motivated by the fact that several male relatives have had premature cardiovascular disease. He is in a stable monogamous relationship. His male partner is HIV positive, on therapy and his viral load is undetectable. They have been using condoms.  Review of Systems: Review of  Systems  Constitutional: Negative for fever, chills, weight loss, malaise/fatigue and diaphoresis.  HENT: Negative for sore throat.   Respiratory: Negative for cough, sputum production and shortness of breath.   Cardiovascular: Negative for chest pain.  Gastrointestinal: Negative for nausea, vomiting and diarrhea.  Genitourinary: Negative for dysuria.  Musculoskeletal: Negative for myalgias and joint pain.  Skin: Negative for rash.  Neurological: Negative for focal weakness.  Psychiatric/Behavioral: Negative for depression and substance abuse. The patient is not nervous/anxious.     Past Medical History  Diagnosis Date  . Hypertension   . HIV infection (HCC)   . Seasonal allergic rhinitis     weather changes, spring/fall  . Low HDL (under 40)   . Family history of heart disease in male family member before age 44     Social History  Substance Use Topics  . Smoking status: Former Smoker -- 0.50 packs/day    Start date: 09/03/2013    Quit date: 10/20/2015  . Smokeless tobacco: Never Used     Comment: congratulated!!  . Alcohol Use: 6.0 oz/week    5 Cans of beer, 5 Shots of liquor per week     Comment: 240 oz on weekends    Family History  Problem Relation Age of Onset  . Hypertension Mother   . Stroke Mother   . Heart disease Father 68    died of MI  . Diabetes Father   . Hypertension Sister   . Diabetes Sister   . Heart disease Paternal Uncle  several uncles died in 28s with MI  . Hypertension Paternal Uncle   . Hypertension Maternal Grandmother   . Stroke Maternal Grandmother   . Hypertension Maternal Grandfather   . Cancer Paternal Grandmother     lung    Allergies  Allergen Reactions  . Shellfish Allergy     Objective:  Filed Vitals:   12/20/15 1537  BP: 126/81  Pulse: 79  Temp: 98 F (36.7 C)  TempSrc: Oral  Height:  (1.854 m)  Weight: 240 lb (108.863 kg)   Body mass index is 31.67 kg/(m^2).  Physical Exam  Constitutional: He is  oriented to person, place, and time.  He is smiling and in good spirits.  HENT:  Mouth/Throat: No oropharyngeal exudate.  Eyes: Conjunctivae are normal.  Cardiovascular: Normal rate and regular rhythm.   No murmur heard. Pulmonary/Chest: Breath sounds normal.  Abdominal: Soft. There is no tenderness.  Musculoskeletal: Normal range of motion.  Neurological: He is alert and oriented to person, place, and time.  Skin: No rash noted.  Psychiatric: Mood and affect normal.    Lab Results Lab Results  Component Value Date   WBC 5.3 09/13/2015   HGB 14.2 09/13/2015   HCT 42.8 09/13/2015   MCV 85.6 09/13/2015   PLT 230 09/13/2015    Lab Results  Component Value Date   CREATININE 1.04 09/13/2015   BUN 18 09/13/2015   NA 139 09/13/2015   K 4.1 09/13/2015   CL 105 09/13/2015   CO2 27 09/13/2015    Lab Results  Component Value Date   ALT 25 09/13/2015   AST 20 09/13/2015   ALKPHOS 59 09/13/2015   BILITOT 0.6 09/13/2015    Lab Results  Component Value Date   CHOL 165 09/13/2015   HDL 59 09/13/2015   LDLCALC 91 09/13/2015   TRIG 74 09/13/2015   CHOLHDL 2.8 09/13/2015    Lab Results HIV 1 RNA QUANT (copies/mL)  Date Value  09/13/2015 <20  05/08/2015 2714*  01/31/2015 <20   CD4 T CELL ABS (/uL)  Date Value  09/13/2015 610  05/08/2015 480  01/31/2015 470      Problem List Items Addressed This Visit      Unprioritized   Former smoker   HTN (hypertension) - Primary    His blood pressure is under good control. His renal function is normal.      Human immunodeficiency virus (HIV) disease (HCC) (Chronic)    He did have some reactivation of his viral load last summer when he was off of medication but he suppressed and was undetectable when last checked in October. I will repeat his lab work today and change Stribild to [employer]. I also asked him to take it with food. He stated that that would not be a problem and he would take it with breakfast each morning. I also  encouraged him to keep regular visits. I filled out FMLA papers so that he would be able to get off of work for visits. I suggested a visit every 6 months and that he can have blood work drawn during the visit. He was also given a supply of condoms.      Relevant Medications   elvitegravir-cobicistat-emtricitabine-tenofovir (GENVOYA) 150-150-200-10 MG TABS tablet   Other Relevant Orders   T-helper cell (CD4)- ([facility] clinic only)   HIV 1 RNA quant-no reflex-bld   CBC   Comprehensive metabolic panel   RPR   Lipid panel        [name],  MD [facility] [phone] pager   [phone] cell 12/20/2015, 4:27 PM

## 2015-12-20 NOTE — Assessment & Plan Note (Signed)
His blood pressure is under good control. His renal function is normal.

## 2015-12-21 ENCOUNTER — Telehealth: Payer: Self-pay | Admitting: Emergency Medicine

## 2015-12-21 ENCOUNTER — Telehealth: Payer: Self-pay | Admitting: *Deleted

## 2015-12-21 ENCOUNTER — Encounter: Payer: Self-pay | Admitting: Internal Medicine

## 2015-12-21 LAB — RPR

## 2015-12-21 NOTE — Telephone Encounter (Signed)
[employer] assistance card at front desk.  patietn will come by today to pick it up. Andree Coss, RN

## 2015-12-21 NOTE — Telephone Encounter (Signed)
SPOKE WITH [facility] IN REGARDS TO PT'S ADAP STATUS; PT APPROVED [phone]; WILL BE ABLE TO PICK UP MEDS FROM [facility] [place] [id number]

## 2015-12-22 LAB — HIV-1 RNA QUANT-NO REFLEX-BLD: HIV 1 RNA Quant: <20 copies/mL (ref <20)

## 2015-12-22 LAB — T-HELPER CELL (CD4) - (RCID CLINIC ONLY)
CD4 % Helper T Cell: 40 % (ref 33–55)
CD4 T CELL ABS: 660 /uL (ref 400–2700)

## 2016-01-22 ENCOUNTER — Encounter: Payer: Self-pay | Admitting: Internal Medicine

## 2016-01-22 ENCOUNTER — Other Ambulatory Visit: Payer: Self-pay | Admitting: Medical

## 2016-01-22 NOTE — Telephone Encounter (Signed)
Patient has lost insurance, cannot go to his PCP at the moment. He is asking Dr. Orvan Falconer to refill his epipen and hypertension medication.  Please advise.  Unsure how patient will pay for his medications without insurance coverage. Andree Coss, RN

## 2016-01-22 NOTE — Addendum Note (Signed)
Addended by: Kieth Brightly on: 01/22/2016 10:47 AM   Modules accepted: Orders

## 2016-01-26 ENCOUNTER — Other Ambulatory Visit: Payer: Self-pay | Admitting: *Deleted

## 2016-01-26 DIAGNOSIS — B20 Human immunodeficiency virus [HIV] disease: Secondary | ICD-10-CM

## 2016-01-26 DIAGNOSIS — I10 Essential (primary) hypertension: Secondary | ICD-10-CM

## 2016-01-26 MED ORDER — HYDROCHLOROTHIAZIDE 12.5 MG PO CAPS: 12.5000 mg | ORAL_CAPSULE | Freq: Every day | ORAL | Status: DC

## 2016-02-20 ENCOUNTER — Other Ambulatory Visit: Payer: Self-pay | Admitting: Internal Medicine

## 2016-05-24 ENCOUNTER — Encounter: Payer: Self-pay | Admitting: Internal Medicine

## 2016-06-19 ENCOUNTER — Other Ambulatory Visit: Payer: Self-pay

## 2016-06-19 ENCOUNTER — Other Ambulatory Visit: Payer: Self-pay | Admitting: Internal Medicine

## 2016-06-19 DIAGNOSIS — Z21 Asymptomatic human immunodeficiency virus [HIV] infection status: Secondary | ICD-10-CM

## 2016-06-19 LAB — CBC WITH DIFFERENTIAL/PLATELET
BASOS PCT: 0 %
Basophils Absolute: 0 cells/uL (ref 0–200)
EOS PCT: 4 %
Eosinophils Absolute: 208 cells/uL (ref 15–500)
HCT: 45.7 % (ref 38.5–50.0)
Hemoglobin: 15.4 g/dL (ref 13.2–17.1)
LYMPHS PCT: 25 %
Lymphs Abs: 1300 cells/uL (ref 850–3900)
MCH: 28.6 pg (ref 27.0–33.0)
MCHC: 33.7 g/dL (ref 32.0–36.0)
MCV: 84.8 fL (ref 80.0–100.0)
MONOS PCT: 14 %
MPV: 10 fL (ref 7.5–12.5)
Monocytes Absolute: 728 cells/uL (ref 200–950)
Neutro Abs: 2964 cells/uL (ref 1500–7800)
Neutrophils Relative %: 57 %
PLATELETS: 210 10*3/uL (ref 140–400)
RBC: 5.39 MIL/uL (ref 4.20–5.80)
RDW: 14.7 % (ref 11.0–15.0)
WBC: 5.2 10*3/uL (ref 3.8–10.8)

## 2016-06-20 LAB — COMPREHENSIVE METABOLIC PANEL
ALBUMIN: 4.7 g/dL (ref 3.6–5.1)
ALT: 24 U/L (ref 9–46)
AST: 21 U/L (ref 10–40)
Alkaline Phosphatase: 55 U/L (ref 40–115)
BILIRUBIN TOTAL: 0.5 mg/dL (ref 0.2–1.2)
BUN: 16 mg/dL (ref 7–25)
CHLORIDE: 106 mmol/L (ref 98–110)
CO2: 21 mmol/L (ref 20–31)
CREATININE: 1.29 mg/dL (ref 0.60–1.35)
Calcium: 9.4 mg/dL (ref 8.6–10.3)
Glucose, Bld: 122 mg/dL — ABNORMAL HIGH (ref 65–99)
Potassium: 4 mmol/L (ref 3.5–5.3)
SODIUM: 139 mmol/L (ref 135–146)
TOTAL PROTEIN: 7.7 g/dL (ref 6.1–8.1)

## 2016-06-21 LAB — T-HELPER CELL (CD4) - (RCID CLINIC ONLY)
CD4 % Helper T Cell: 36 % (ref 33–55)
CD4 T Cell Abs: 490 /uL (ref 400–2700)

## 2016-06-24 LAB — HIV-1 RNA QUANT-NO REFLEX-BLD: HIV-1 RNA Quant, Log: <1.3 Log copies/mL (ref <1.30)

## 2016-07-03 ENCOUNTER — Encounter: Payer: Self-pay | Admitting: Internal Medicine

## 2016-07-03 NOTE — Progress Notes (Deleted)
Patient Active Problem List   Diagnosis Date Noted  . Former smoker 12/20/2015  . Screening examination for venereal disease 07/27/2014  . Encounter for long-term (current) use of other medications 07/27/2014  . Seborrheic dermatitis 08/19/2013  . HTN (hypertension) 06/25/2013  . Human immunodeficiency virus (HIV) disease (HCC) 06/25/2013    Patient's Medications  New Prescriptions   No medications on file  Previous Medications   ELVITEGRAVIR-COBICISTAT-EMTRICITABINE-TENOFOVIR (GENVOYA) 150-150-200-10 MG TABS TABLET    Take 1 tablet by mouth daily with breakfast.   EPINEPHRINE (EPIPEN) 0.3 MG/0.3 ML SOAJ INJECTION    Inject 0.3 mLs (0.3 mg total) into the muscle once.   HYDROCHLOROTHIAZIDE (MICROZIDE) 12.5 MG CAPSULE    TAKE ONE CAPSULE BY MOUTH DAILY   LISINOPRIL-HYDROCHLOROTHIAZIDE (ZESTORETIC) 20-12.5 MG PER TABLET    Take 1 tablet by mouth daily.  Modified Medications   No medications on file  Discontinued Medications   No medications on file    Subjective: Douglas [name] is in for his routine HIV followup visit. After his visit in January he changed to Douglas [name].   Review of Systems: Review of Systems  Constitutional: Negative for chills, diaphoresis, fever, malaise/fatigue and weight loss.  HENT: Negative for sore throat.   Respiratory: Negative for cough, sputum production and shortness of breath.   Cardiovascular: Negative for chest pain.  Gastrointestinal: Negative for diarrhea, nausea and vomiting.  Genitourinary: Negative for dysuria and frequency.  Musculoskeletal: Negative for joint pain and myalgias.  Skin: Negative for rash.  Neurological: Negative for dizziness and headaches.  Psychiatric/Behavioral: Negative for depression and substance abuse. The patient is not nervous/anxious.     Past Medical History:  Diagnosis Date  . Family history of heart disease in male family member before age 62   . HIV infection (HCC)   . Hypertension   . Low HDL (under  40)   . Seasonal allergic rhinitis    weather changes, spring/fall    Social History  Substance Use Topics  . Smoking status: Former Smoker    Packs/day: 0.50    Start date: 09/03/2013    Quit date: 10/20/2015  . Smokeless tobacco: Never Used     Comment: congratulated!!  . Alcohol use 6.0 oz/week    5 Cans of beer, 5 Shots of liquor per week     Comment: 240 oz on weekends    Family History  Problem Relation Age of Onset  . Hypertension Mother   . Stroke Mother   . Heart disease Father 32    died of MI  . Diabetes Father   . Hypertension Sister   . Diabetes Sister   . Heart disease Paternal Uncle     several uncles died in 55s with MI  . Hypertension Paternal Uncle   . Hypertension Maternal Grandmother   . Stroke Maternal Grandmother   . Hypertension Maternal Grandfather   . Cancer Paternal Grandmother     lung    Allergies  Allergen Reactions  . Shellfish Allergy     Objective:  There were no vitals filed for this visit. There is no height or weight on file to calculate BMI.  Physical Exam  Constitutional: He is oriented to person, place, and time.  HENT:  Mouth/Throat: No oropharyngeal exudate.  Eyes: Conjunctivae are normal.  Cardiovascular: Normal rate and regular rhythm.   No murmur heard. Pulmonary/Chest: Breath sounds normal.  Abdominal: Soft. He exhibits no mass. There is no tenderness.  Musculoskeletal: Normal range  of motion.  Neurological: He is alert and oriented to person, place, and time.  Skin: No rash noted.  Psychiatric: Mood and affect normal.    Lab Results Lab Results  Component Value Date   WBC 5.2 06/19/2016   HGB 15.4 06/19/2016   HCT 45.7 06/19/2016   MCV 84.8 06/19/2016   PLT 210 06/19/2016    Lab Results  Component Value Date   CREATININE 1.29 06/19/2016   BUN 16 06/19/2016   NA 139 06/19/2016   K 4.0 06/19/2016   CL 106 06/19/2016   CO2 21 06/19/2016    Lab Results  Component Value Date   ALT 24 06/19/2016     AST 21 06/19/2016   ALKPHOS 55 06/19/2016   BILITOT 0.5 06/19/2016    Lab Results  Component Value Date   CHOL 202 (H) 12/20/2015   HDL 45 12/20/2015   LDLCALC 124 12/20/2015   TRIG 166 (H) 12/20/2015   CHOLHDL 4.5 12/20/2015   HIV 1 RNA Quant (copies/mL)  Date Value  06/19/2016 <20  12/20/2015 <20  09/13/2015 <20   CD4 T Cell Abs (/uL)  Date Value  06/19/2016 490  12/20/2015 660  09/13/2015 610     Problem List Items Addressed This Visit    None    Visit Diagnoses   None.       Douglas AstersJohn Fields Oros, MD [name] [phone] pager   [phone] cell 07/03/2016, 12:14 PM

## 2016-07-03 NOTE — Assessment & Plan Note (Signed)
His infection remains under excellent control. He will followup after lab work in 6 months.

## 2016-10-31 ENCOUNTER — Encounter: Payer: Self-pay | Admitting: Internal Medicine

## 2016-12-22 ENCOUNTER — Encounter: Payer: Self-pay | Admitting: Internal Medicine

## 2017-01-02 ENCOUNTER — Other Ambulatory Visit: Payer: BLUE CROSS/BLUE SHIELD

## 2017-01-02 DIAGNOSIS — B20 Human immunodeficiency virus [HIV] disease: Secondary | ICD-10-CM

## 2017-01-02 LAB — LIPID PANEL
Cholesterol: 180 mg/dL (ref <200)
HDL: 55 mg/dL (ref >40)
LDL CALC: 111 mg/dL — AB (ref <100)
Total CHOL/HDL Ratio: 3.3 Ratio (ref <5.0)
Triglycerides: 72 mg/dL (ref <150)
VLDL: 14 mg/dL (ref <30)

## 2017-01-02 LAB — COMPREHENSIVE METABOLIC PANEL
ALK PHOS: 60 U/L (ref 40–115)
ALT: 25 U/L (ref 9–46)
AST: 17 U/L (ref 10–40)
Albumin: 4.6 g/dL (ref 3.6–5.1)
BILIRUBIN TOTAL: 0.5 mg/dL (ref 0.2–1.2)
BUN: 15 mg/dL (ref 7–25)
CALCIUM: 9.4 mg/dL (ref 8.6–10.3)
CO2: 25 mmol/L (ref 20–31)
Chloride: 106 mmol/L (ref 98–110)
Creat: 1.12 mg/dL (ref 0.60–1.35)
GLUCOSE: 116 mg/dL — AB (ref 65–99)
Potassium: 4.2 mmol/L (ref 3.5–5.3)
SODIUM: 143 mmol/L (ref 135–146)
Total Protein: 7.7 g/dL (ref 6.1–8.1)

## 2017-01-02 LAB — CBC
HCT: 46.1 % (ref 38.5–50.0)
HEMOGLOBIN: 15.3 g/dL (ref 13.2–17.1)
MCH: 28.5 pg (ref 27.0–33.0)
MCHC: 33.2 g/dL (ref 32.0–36.0)
MCV: 85.8 fL (ref 80.0–100.0)
MPV: 10.1 fL (ref 7.5–12.5)
PLATELETS: 235 10*3/uL (ref 140–400)
RBC: 5.37 MIL/uL (ref 4.20–5.80)
RDW: 14.2 % (ref 11.0–15.0)
WBC: 5.1 10*3/uL (ref 3.8–10.8)

## 2017-01-03 LAB — T-HELPER CELL (CD4) - (RCID CLINIC ONLY)
CD4 T CELL HELPER: 36 % (ref 33–55)
CD4 T Cell Abs: 680 /uL (ref 400–2700)

## 2017-01-03 LAB — RPR

## 2017-01-06 LAB — HIV-1 RNA QUANT-NO REFLEX-BLD
HIV 1 RNA QUANT: NOT DETECTED {copies}/mL
HIV-1 RNA QUANT, LOG: NOT DETECTED {Log_copies}/mL

## 2017-01-16 ENCOUNTER — Encounter: Payer: Self-pay | Admitting: Internal Medicine

## 2017-01-16 ENCOUNTER — Ambulatory Visit (INDEPENDENT_AMBULATORY_CARE_PROVIDER_SITE_OTHER): Payer: BLUE CROSS/BLUE SHIELD | Admitting: Internal Medicine

## 2017-01-16 VITALS — BP 157/108 | HR 74 | Temp 98.2°F | Ht 72.0 in | Wt 258.5 lb

## 2017-01-16 DIAGNOSIS — Z23 Encounter for immunization: Secondary | ICD-10-CM

## 2017-01-16 DIAGNOSIS — F17210 Nicotine dependence, cigarettes, uncomplicated: Secondary | ICD-10-CM

## 2017-01-16 DIAGNOSIS — Z0000 Encounter for general adult medical examination without abnormal findings: Secondary | ICD-10-CM | POA: Diagnosis not present

## 2017-01-16 DIAGNOSIS — I10 Essential (primary) hypertension: Secondary | ICD-10-CM

## 2017-01-16 DIAGNOSIS — B20 Human immunodeficiency virus [HIV] disease: Secondary | ICD-10-CM

## 2017-01-16 MED ORDER — ELVITEG-COBIC-EMTRICIT-TENOFAF 150-150-200-10 MG PO TABS: 1.0000 | ORAL_TABLET | Freq: Every day | ORAL | 11 refills | Status: DC

## 2017-01-16 NOTE — Progress Notes (Signed)
Patient Active Problem List   Diagnosis Date Noted  . Human immunodeficiency virus (HIV) disease (HCC) 06/25/2013    Priority: High  . Cigarette smoker 12/20/2015  . Screening examination for venereal disease 07/27/2014  . Encounter for long-term (current) use of other medications 07/27/2014  . Seborrheic dermatitis 08/19/2013  . HTN (hypertension) 06/25/2013    Patient's Medications  New Prescriptions   No medications on file  Previous Medications   EPINEPHRINE (EPIPEN) 0.3 MG/0.3 ML SOAJ INJECTION    Inject 0.3 mLs (0.3 mg total) into the muscle once.   HYDROCHLOROTHIAZIDE (MICROZIDE) 12.5 MG CAPSULE    TAKE ONE CAPSULE BY MOUTH DAILY   LISINOPRIL-HYDROCHLOROTHIAZIDE (ZESTORETIC) 20-12.5 MG PER TABLET    Take 1 tablet by mouth daily.  Modified Medications   Modified Medication Previous Medication   ELVITEGRAVIR-COBICISTAT-EMTRICITABINE-TENOFOVIR (GENVOYA) 150-150-200-10 MG TABS TABLET elvitegravir-cobicistat-emtricitabine-tenofovir (GENVOYA) 150-150-200-10 MG TABS tablet      Take 1 tablet by mouth daily with breakfast.    Take 1 tablet by mouth daily with breakfast.  Discontinued Medications   No medications on file    Subjective: Douglas [name] is in for his routine HIV follow-up visit. He has had no problems obtaining or tolerating his Genvoya. He estimates that he misses it 2-3 times a month when he gets to work and has forgotten to take it. 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 This is caused him to start back smoking cigarettes. He is smoking about a half pack daily. He drinks about 24 ounces of beer daily and he is not getting any regular exercise. He has a strong family history of diabetes in his mother, father and sister.  Review of Systems: Review of Systems  Constitutional: Negative for chills, diaphoresis, fever, malaise/fatigue and weight loss.  HENT: Negative for sore throat.   Respiratory: Negative for  cough, sputum production and shortness of breath.   Cardiovascular: Negative for chest pain.  Gastrointestinal: Negative for abdominal pain, diarrhea, heartburn, nausea and vomiting.  Genitourinary: Negative for dysuria and frequency.  Musculoskeletal: Negative for joint pain and myalgias.  Skin: Negative for rash.  Neurological: Negative for dizziness and headaches.  Psychiatric/Behavioral: Negative for depression and substance abuse. The patient is not nervous/anxious.     Past Medical History:  Diagnosis Date  . Family history of heart disease in male family member before age 36   . HIV infection (HCC)   . Hypertension   . Low HDL (under 40)   . Seasonal allergic rhinitis    weather changes, spring/fall    Social History  Substance Use Topics  . Smoking status: Current Every Day Smoker    Packs/day: 0.50    Start date: 09/03/2013    Last attempt to quit: 10/20/2015  . Smokeless tobacco: Never Used     Comment: stress at his job  . Alcohol use 6.0 oz/week    5 Cans of beer, 5 Shots of liquor per week     Comment: shots on the weekends, 24oz of beer daily    Family History  Problem Relation Age of Onset  . Hypertension Mother   . Stroke Mother   . Heart disease Father 58    died of MI  . Diabetes Father   . Hypertension Sister   . Diabetes Sister   . Heart disease Paternal Uncle     several uncles died in 45s with MI  . Hypertension  Paternal Uncle   . Hypertension Maternal Grandmother   . Stroke Maternal Grandmother   . Hypertension Maternal Grandfather   . Cancer Paternal Grandmother     lung    Allergies  Allergen Reactions  . Shellfish Allergy     Objective:  Vitals:   01/16/17 0908  BP: (!) 157/108  Pulse: 74  Temp: 98.2 F (36.8 C)  TempSrc: Oral  Weight: 258 lb 8 oz (117.3 kg)  Height: 6' (1.829 m)   Body mass index is 35.06 kg/m.  Physical Exam  Constitutional: He is oriented to person, place, and time.  He is in good spirits. He is  gained 18 pounds since his last visit and his BMI is up to 35.  HENT:  Mouth/Throat: No oropharyngeal exudate.  Eyes: Conjunctivae are normal.  Cardiovascular: Normal rate and regular rhythm.   No murmur heard. Pulmonary/Chest: Breath sounds normal.  Abdominal: Soft. He exhibits no mass. There is no tenderness.  Musculoskeletal: Normal range of motion.  Neurological: He is alert and oriented to person, place, and time.  Skin: No rash noted.  Psychiatric: Mood and affect normal.    Lab Results Lab Results  Component Value Date   WBC 5.1 01/02/2017   HGB 15.3 01/02/2017   HCT 46.1 01/02/2017   MCV 85.8 01/02/2017   PLT 235 01/02/2017    Lab Results  Component Value Date   CREATININE 1.12 01/02/2017   BUN 15 01/02/2017   NA 143 01/02/2017   K 4.2 01/02/2017   CL 106 01/02/2017   CO2 25 01/02/2017    Lab Results  Component Value Date   ALT 25 01/02/2017   AST 17 01/02/2017   ALKPHOS 60 01/02/2017   BILITOT 0.5 01/02/2017    Lab Results  Component Value Date   CHOL 180 01/02/2017   HDL 55 01/02/2017   LDLCALC 111 (H) 01/02/2017   TRIG 72 01/02/2017   CHOLHDL 3.3 01/02/2017   HIV 1 RNA Quant (copies/mL)  Date Value  01/02/2017 <20 NOT DETECTED  06/19/2016 <20  12/20/2015 <20   CD4 T Cell Abs (/uL)  Date Value  01/02/2017 680  06/19/2016 490  12/20/2015 660     Problem List Items Addressed This Visit      High   Human immunodeficiency virus (HIV) disease (HCC) (Chronic)    His infection is under excellent long-term control. He will continue Genvoya in follow-up after lab work in 6 months.      Relevant Medications   elvitegravir-cobicistat-emtricitabine-tenofovir (GENVOYA) 150-150-200-10 MG TABS tablet   Other Relevant Orders   T-helper cell (CD4)- ([facility] clinic only)   HIV 1 RNA quant-no reflex-bld     Unprioritized   Cigarette smoker    I talked him again about a cigarette cessation plan. He quit cold [place] last time. I asked him to set a quit  date and it was the help of his friends and family in helping him quit within the next month.      HTN (hypertension)    His blood pressure is elevated, he is in the morbidly obese range and has hyperglycemia. I talked to him about the importance of lifestyle modification.           Douglas AstersJohn Candler Ginsberg, MD [facility] [phone] pager   [phone] cell 01/16/2017, 9:29 AM

## 2017-01-16 NOTE — Progress Notes (Signed)
Gave Douglas [name] a key chain pill box so he doesn't forget his meds when he is a work. Strongly advised him not to miss doses to avoid developing resistance.

## 2017-01-16 NOTE — Assessment & Plan Note (Signed)
I talked him again about a cigarette cessation plan. He quit cold [place] last time. I asked him to set a quit date and it was the help of his friends and family in helping him quit within the next month.

## 2017-01-16 NOTE — Assessment & Plan Note (Signed)
His blood pressure is elevated, he is in the morbidly obese range and has hyperglycemia. I talked to him about the importance of lifestyle modification.

## 2017-01-16 NOTE — Assessment & Plan Note (Signed)
His infection is under excellent long-term control. He will continue Genvoya in follow-up after lab work in 6 months.

## 2017-01-17 ENCOUNTER — Other Ambulatory Visit: Payer: Self-pay | Admitting: *Deleted

## 2017-01-17 DIAGNOSIS — B20 Human immunodeficiency virus [HIV] disease: Secondary | ICD-10-CM

## 2017-01-17 MED ORDER — ELVITEG-COBIC-EMTRICIT-TENOFAF 150-150-200-10 MG PO TABS: 1.0000 | ORAL_TABLET | Freq: Every day | ORAL | 11 refills | Status: DC

## 2017-01-17 NOTE — Telephone Encounter (Signed)
[facility] now refilling employee Special[facility] Medications - Attn: Kendal HymenBonnie.  Print script, have it signed and fax to [phone], attn: Kendal HymenBonnie

## 2017-01-20 ENCOUNTER — Encounter: Payer: Self-pay | Admitting: Internal Medicine

## 2017-01-30 ENCOUNTER — Encounter: Payer: Self-pay | Admitting: Internal Medicine

## 2017-03-31 ENCOUNTER — Ambulatory Visit: Payer: BLUE CROSS/BLUE SHIELD | Admitting: Family Medicine

## 2017-08-27 NOTE — Progress Notes (Signed)
This encounter was created in error - please disregard.

## 2017-09-03 ENCOUNTER — Encounter: Payer: Self-pay | Admitting: Internal Medicine

## 2017-09-03 ENCOUNTER — Other Ambulatory Visit: Payer: BLUE CROSS/BLUE SHIELD

## 2017-09-12 ENCOUNTER — Other Ambulatory Visit: Payer: BLUE CROSS/BLUE SHIELD

## 2017-09-12 DIAGNOSIS — B20 Human immunodeficiency virus [HIV] disease: Secondary | ICD-10-CM

## 2017-09-12 LAB — T-HELPER CELL (CD4) - (RCID CLINIC ONLY)
CD4 % Helper T Cell: 33 % (ref 33–55)
CD4 T Cell Abs: 530 /uL (ref 400–2700)

## 2017-09-15 ENCOUNTER — Ambulatory Visit: Payer: BLUE CROSS/BLUE SHIELD | Admitting: Internal Medicine

## 2017-09-15 LAB — HIV-1 RNA QUANT-NO REFLEX-BLD
HIV 1 RNA QUANT: NOT DETECTED {copies}/mL
HIV-1 RNA QUANT, LOG: NOT DETECTED {Log_copies}/mL

## 2017-09-17 ENCOUNTER — Ambulatory Visit: Payer: BLUE CROSS/BLUE SHIELD | Admitting: Internal Medicine

## 2017-09-29 ENCOUNTER — Ambulatory Visit: Payer: BLUE CROSS/BLUE SHIELD | Admitting: Internal Medicine

## 2017-11-28 ENCOUNTER — Ambulatory Visit: Payer: BLUE CROSS/BLUE SHIELD | Admitting: Family Medicine

## 2017-11-28 ENCOUNTER — Encounter: Payer: Self-pay | Admitting: Family Medicine

## 2017-11-28 ENCOUNTER — Other Ambulatory Visit: Payer: Self-pay

## 2017-11-28 VITALS — BP 142/82 | HR 71 | Temp 98.3°F | Ht 73.0 in | Wt 260.0 lb

## 2017-11-28 DIAGNOSIS — Z7689 Persons encountering health services in other specified circumstances: Secondary | ICD-10-CM

## 2017-11-28 DIAGNOSIS — F17210 Nicotine dependence, cigarettes, uncomplicated: Secondary | ICD-10-CM | POA: Diagnosis not present

## 2017-11-28 DIAGNOSIS — I10 Essential (primary) hypertension: Secondary | ICD-10-CM

## 2017-11-28 DIAGNOSIS — L219 Seborrheic dermatitis, unspecified: Secondary | ICD-10-CM | POA: Diagnosis not present

## 2017-11-28 MED ORDER — HYDROCHLOROTHIAZIDE 25 MG PO TABS: 25.0000 mg | ORAL_TABLET | Freq: Two times a day (BID) | ORAL | 3 refills | Status: DC

## 2017-11-28 NOTE — Progress Notes (Signed)
    Subjective:  Douglas [name] is a 37 y.o. male who [facility] with a chief complaint of new patient visit.   HPI:  Sickle Cell trait Stable. No hospitalizations. No complications  HIV Stable. Acquired in 2014 from unprotected sex. Uses barriar protection now.  Stable on Genvoya. CD4 count on 10/2017 was 530 and Viral load undetected. Follows with Dr. Wess Bottsampell with ID.   Hypertension Had HTN since 2004. Normally runs 130/85. Not had any work up for alterantive causes. Patient took medication today, losartan 100mg  and HCTZ 25 mg. Patient has been on the is regimen for 8 months. Labs in 12/2016, Elevated LDL, otherwise in normal limits. CMP shows elevated glucose 116, unsure if fasting. Cr 1.12, remainder wnl.   Smoking Quit 2 weeks ago. 15 year smoking history. Smoked a  0.5 pack a day. Going "cold Mal[place]urkey". Not interested in quit line or alternative meds. Used gum before, but did not help.   Dandruff Patient has been having worsening dandruff this winter. He has more flakes than usual. He has tried head and shoulder's shampoo in the past, but does not seem to be working.   ROS: Per HPI  PMH: Smoking history, PMH,  reviewed   Objective:  Physical Exam: BP (!) 142/82   Pulse 71   Temp 98.3 F (36.8 C) (Oral)   Ht 6\' 1"  (1.854 m)   Wt 260 lb (117.9 kg)   SpO2 98%   BMI 34.30 kg/m   Gen: NAD, resting comfortably CV: RRR with no murmurs appreciated Pulm: NWOB, CTAB with no crackles, wheezes, or rhonchi GI: Normal bowel sounds present. Soft, Nontender, Nondistended. MSK: no edema, cyanosis, or clubbing noted Skin: scalp has multiple regions of flaking skin without erythema or other lesions Neuro: grossly normal, moves all extremities Psych: Normal affect and thought content  No results found for this or any previous visit (from the past 72 hour(s)).   Assessment/Plan:  HTN (hypertension) Patient has elevated BP on exam today. Will increase hctz, have pt  keep bp log, and return in 1 mo.   Seborrheic dermatitis Chronic. Pt has tried H&S w/o relief. Pt not interested in Rx. Recommended selenium sulfide.   Cigarette smoker Pt is trying to quit cold [place]key. Advised pt that this can be improved with gums, medications, and counseling. He is not interested in any alternatives at this time. Will follow up at next visit. I connected the his htn to his smoking and risk for heart disease.    Lab Orders  No laboratory test(s) ordered today    Meds ordered this encounter  Medications  . hydrochlorothiazide (HYDRODIURIL) 25 MG tablet    Sig: Take 1 tablet (25 mg total) by mouth 2 (two) times daily.    Dispense:  90 tablet    Refill:  3    Thomes DinningBrad Thompson, MD, MS FAMILY MEDICINE RESIDENT - PGY1 12/02/2017 7:40 PM

## 2017-11-28 NOTE — Patient Instructions (Signed)
It was a pleasure to see you today! Thank you for choosing [facility] for your primary care. Douglas [name] was seen for new patient visit and hypertension. Increase your HCTZ to two times a day. Check your blood pressure 3 times a week at home and keep a long. Come back to clinic in 1 month for a blood pressure check. Call the clinic if you experience any dizziness, chest pain, trouble breathing. Call 911 or go to ED if these symptoms become severe or do not resolve.   Meds ordered this encounter  Medications  . hydrochlorothiazide (HYDRODIURIL) 25 MG tablet    Sig: Take 1 tablet (25 mg total) by mouth 2 (two) times daily.    Dispense:  90 tablet    Refill:  3     Best,  Thomes DinningBrad Christin Mccreedy, MD, MS FAMILY MEDICINE RESIDENT - PGY1 11/28/2017 11:22 AM

## 2017-12-02 ENCOUNTER — Encounter: Payer: Self-pay | Admitting: Family Medicine

## 2017-12-02 NOTE — Assessment & Plan Note (Signed)
Pt is trying to quit cold [place]key. Advised pt that this can be improved with gums, medications, and counseling. He is not interested in any alternatives at this time. Will follow up at next visit. I connected the his htn to his smoking and risk for heart disease.

## 2017-12-02 NOTE — Assessment & Plan Note (Signed)
Chronic. Pt has tried H&S w/o relief. Pt not interested in Rx. Recommended selenium sulfide.

## 2017-12-02 NOTE — Assessment & Plan Note (Signed)
Patient has elevated BP on exam today. Will increase hctz, have pt keep bp log, and return in 1 mo.

## 2017-12-09 ENCOUNTER — Other Ambulatory Visit: Payer: Self-pay | Admitting: *Deleted

## 2017-12-09 DIAGNOSIS — B20 Human immunodeficiency virus [HIV] disease: Secondary | ICD-10-CM

## 2017-12-09 DIAGNOSIS — Z113 Encounter for screening for infections with a predominantly sexual mode of transmission: Secondary | ICD-10-CM

## 2017-12-09 DIAGNOSIS — Z79899 Other long term (current) drug therapy: Secondary | ICD-10-CM

## 2017-12-11 ENCOUNTER — Encounter: Payer: Self-pay | Admitting: *Deleted

## 2017-12-11 ENCOUNTER — Other Ambulatory Visit: Payer: BLUE CROSS/BLUE SHIELD

## 2017-12-11 ENCOUNTER — Ambulatory Visit: Payer: BLUE CROSS/BLUE SHIELD | Admitting: Internal Medicine

## 2017-12-11 DIAGNOSIS — B20 Human immunodeficiency virus [HIV] disease: Secondary | ICD-10-CM

## 2017-12-11 DIAGNOSIS — Z113 Encounter for screening for infections with a predominantly sexual mode of transmission: Secondary | ICD-10-CM

## 2017-12-11 DIAGNOSIS — Z79899 Other long term (current) drug therapy: Secondary | ICD-10-CM

## 2017-12-12 LAB — COMPREHENSIVE METABOLIC PANEL
AG RATIO: 1.7 (calc) (ref 1.0–2.5)
ALT: 33 U/L (ref 9–46)
AST: 18 U/L (ref 10–40)
Albumin: 4.7 g/dL (ref 3.6–5.1)
Alkaline phosphatase (APISO): 48 U/L (ref 40–115)
BUN: 17 mg/dL (ref 7–25)
CO2: 29 mmol/L (ref 20–32)
Calcium: 9.7 mg/dL (ref 8.6–10.3)
Chloride: 100 mmol/L (ref 98–110)
Creat: 1.16 mg/dL (ref 0.60–1.35)
GLUCOSE: 98 mg/dL (ref 65–99)
Globulin: 2.8 g/dL (calc) (ref 1.9–3.7)
Potassium: 3.5 mmol/L (ref 3.5–5.3)
SODIUM: 137 mmol/L (ref 135–146)
TOTAL PROTEIN: 7.5 g/dL (ref 6.1–8.1)
Total Bilirubin: 0.7 mg/dL (ref 0.2–1.2)

## 2017-12-12 LAB — LIPID PANEL
CHOL/HDL RATIO: 4.8 (calc) (ref <5.0)
Cholesterol: 195 mg/dL (ref <200)
HDL: 41 mg/dL (ref >40)
LDL Cholesterol (Calc): 131 mg/dL (calc) — ABNORMAL HIGH
NON-HDL CHOLESTEROL (CALC): 154 mg/dL — AB (ref <130)
Triglycerides: 124 mg/dL (ref <150)

## 2017-12-12 LAB — T-HELPER CELL (CD4) - (RCID CLINIC ONLY)
CD4 T CELL ABS: 550 /uL (ref 400–2700)
CD4 T CELL HELPER: 35 % (ref 33–55)

## 2017-12-12 LAB — CBC WITH DIFFERENTIAL/PLATELET
BASOS ABS: 29 {cells}/uL (ref 0–200)
BASOS PCT: 0.7 %
Eosinophils Absolute: 271 cells/uL (ref 15–500)
Eosinophils Relative: 6.6 %
HEMATOCRIT: 42.3 % (ref 38.5–50.0)
HEMOGLOBIN: 14.2 g/dL (ref 13.2–17.1)
LYMPHS ABS: 1382 {cells}/uL (ref 850–3900)
MCH: 27.9 pg (ref 27.0–33.0)
MCHC: 33.6 g/dL (ref 32.0–36.0)
MCV: 83.1 fL (ref 80.0–100.0)
MPV: 10.8 fL (ref 7.5–12.5)
Monocytes Relative: 17.4 %
NEUTROS ABS: 1706 {cells}/uL (ref 1500–7800)
Neutrophils Relative %: 41.6 %
Platelets: 237 10*3/uL (ref 140–400)
RBC: 5.09 10*6/uL (ref 4.20–5.80)
RDW: 13.3 % (ref 11.0–15.0)
Total Lymphocyte: 33.7 %
WBC: 4.1 10*3/uL (ref 3.8–10.8)
WBCMIX: 713 {cells}/uL (ref 200–950)

## 2017-12-12 LAB — RPR: RPR: NONREACTIVE

## 2017-12-13 LAB — HIV-1 RNA QUANT-NO REFLEX-BLD
HIV 1 RNA Quant: <20 copies/mL — AB
HIV-1 RNA Quant, Log: <1.3 Log copies/mL — AB

## 2017-12-19 ENCOUNTER — Telehealth: Payer: Self-pay | Admitting: Behavioral Health

## 2017-12-19 NOTE — Telephone Encounter (Signed)
Called patient to give him an update on rx refill.  Writer will send him a Wellsite geologistmychart message

## 2017-12-19 NOTE — Telephone Encounter (Signed)
Patient called stating he only had 8 days worth of  medications left and states he is now using [facility] pharmacy now and needs to get his medications filled there.  Patient told Clinical research associatewriter that he spoke to [employer] insurance company and pharmacy and a prior authorization needed to be completed.  We were able to find the prior authorization form and fax it to  [facility].  Will call patient back to give him this information.

## 2017-12-22 ENCOUNTER — Encounter: Payer: Self-pay | Admitting: *Deleted

## 2017-12-22 ENCOUNTER — Other Ambulatory Visit: Payer: Self-pay | Admitting: *Deleted

## 2017-12-22 DIAGNOSIS — B20 Human immunodeficiency virus [HIV] disease: Secondary | ICD-10-CM

## 2017-12-22 MED ORDER — ELVITEG-COBIC-EMTRICIT-TENOFAF 150-150-200-10 MG PO TABS: 1.0000 | ORAL_TABLET | Freq: Every day | ORAL | 12 refills | Status: DC

## 2018-01-01 ENCOUNTER — Other Ambulatory Visit: Payer: Self-pay | Admitting: Family Medicine

## 2018-01-01 ENCOUNTER — Ambulatory Visit: Payer: BLUE CROSS/BLUE SHIELD | Admitting: Internal Medicine

## 2018-01-01 ENCOUNTER — Encounter: Payer: Self-pay | Admitting: Family Medicine

## 2018-01-01 ENCOUNTER — Encounter: Payer: Self-pay | Admitting: Internal Medicine

## 2018-01-01 DIAGNOSIS — F17210 Nicotine dependence, cigarettes, uncomplicated: Secondary | ICD-10-CM

## 2018-01-01 DIAGNOSIS — B20 Human immunodeficiency virus [HIV] disease: Secondary | ICD-10-CM

## 2018-01-01 DIAGNOSIS — E6609 Other obesity due to excess calories: Secondary | ICD-10-CM | POA: Diagnosis not present

## 2018-01-01 DIAGNOSIS — I10 Essential (primary) hypertension: Secondary | ICD-10-CM

## 2018-01-01 DIAGNOSIS — E669 Obesity, unspecified: Secondary | ICD-10-CM | POA: Insufficient documentation

## 2018-01-01 MED ORDER — LOSARTAN POTASSIUM 100 MG PO TABS: 100.0000 mg | ORAL_TABLET | Freq: Every day | ORAL | 11 refills | Status: DC

## 2018-01-01 NOTE — Assessment & Plan Note (Signed)
His infection is under excellent, long-term control.  I gave him the option of changing to [facility] which he can take with or without food but he prefers to continue Genvoya.  He will follow-up after lab work in 1 year.

## 2018-01-01 NOTE — Assessment & Plan Note (Signed)
His blood pressure is at goal on his new antihypertensive regimen.

## 2018-01-01 NOTE — Progress Notes (Signed)
Patient Active Problem List   Diagnosis Date Noted  . Human immunodeficiency virus (HIV) disease (HCC) 06/25/2013    Priority: High  . Obesity 01/01/2018  . Cigarette smoker 12/20/2015  . Screening examination for venereal disease 07/27/2014  . Encounter for long-term (current) use of other medications 07/27/2014  . Seborrheic dermatitis 08/19/2013  . HTN (hypertension) 06/25/2013    Patient's Medications  New Prescriptions   No medications on file  Previous Medications   ELVITEGRAVIR-COBICISTAT-EMTRICITABINE-TENOFOVIR (GENVOYA) 150-150-200-10 MG TABS TABLET    Take 1 tablet by mouth daily with breakfast.   EPINEPHRINE (EPIPEN) 0.3 MG/0.3 ML SOAJ INJECTION    Inject 0.3 mLs (0.3 mg total) into the muscle once.   HYDROCHLOROTHIAZIDE (HYDRODIURIL) 25 MG TABLET    Take 1 tablet (25 mg total) by mouth 2 (two) times daily.   LOSARTAN (COZAAR) 100 MG TABLET    Take 100 mg by mouth daily.  Modified Medications   No medications on file  Discontinued Medications   No medications on file    Subjective: Douglas [name] is in for his routine HIV follow-up visit.  He has had no problems obtaining, taking or tolerating his Genvoya.  He takes it when he gets up each morning.  He usually takes it with breakfast but occasionally only with a cup of coffee.  On weekends he frequently does not wake up until 9 or 10:00 so he takes it a little bit later.  He does not recall missing doses.  He is still smoking a few cigarettes daily.  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  Yesterday she had problem with fecal incontinence.  He is planning on taking her back to see her doctor.  He gets some help caring for her on weekends from his sister.  He recently changed lisinopril to hydrochlorothiazide and losartan because of chronic cough.  His cough resolved promptly.  He is not getting regular exercise.  Review of Systems: Review of Systems  Constitutional: Negative for chills,  diaphoresis, fever, malaise/fatigue and weight loss.  HENT: Negative for sore throat.   Respiratory: Negative for cough, sputum production and shortness of breath.   Cardiovascular: Negative for chest pain.  Gastrointestinal: Negative for abdominal pain, diarrhea, heartburn, nausea and vomiting.  Genitourinary: Negative for dysuria and frequency.  Musculoskeletal: Negative for joint pain and myalgias.  Skin: Negative for rash.  Neurological: Negative for dizziness and headaches.  Psychiatric/Behavioral: Negative for depression and substance abuse. The patient is not nervous/anxious.     Past Medical History:  Diagnosis Date  . Family history of heart disease in male family member before age 109   . HIV infection (HCC)   . Hypertension   . Low HDL (under 40)   . Seasonal allergic rhinitis    weather changes, spring/fall  . Sickle cell trait (HCC)     Social History   Tobacco Use  . Smoking status: Former Smoker    Packs/day: 0.50    Start date: 09/03/2013    Last attempt to quit: 11/14/2017    Years since quitting: 0.1  . Smokeless tobacco: Never Used  . Tobacco comment: stress at his job  Substance Use Topics  . Alcohol use: Yes    Alcohol/week: 3.0 oz    Types: 2 Cans of beer, 3 Shots of liquor per week    Comment: shots on the weekends, 24oz of beer daily  . Drug use: No  Family History  Problem Relation Age of Onset  . Hypertension Mother   . Stroke Mother   . Heart disease Father 50       died of MI  . Diabetes Father   . Hypertension Sister   . Diabetes Sister   . Heart disease Paternal Uncle        several uncles died in 86s with MI  . Hypertension Paternal Uncle   . Hypertension Maternal Grandmother   . Stroke Maternal Grandmother   . Hypertension Maternal Grandfather   . Cancer Paternal Grandmother        lung    Allergies  Allergen Reactions  . Shellfish Allergy Anaphylaxis    Health Maintenance  Topic Date Due  . TETANUS/TDAP  06/10/2023    . INFLUENZA VACCINE  Completed  . HIV Screening  Completed    Objective:  Vitals:   01/01/18 0837  BP: 124/77  Pulse: 72  Temp: 98.5 F (36.9 C)  TempSrc: Oral  Weight: 257 lb (116.6 kg)  Height: 6' (1.829 m)   Body mass index is 34.86 kg/m.  Physical Exam  Constitutional: He is oriented to person, place, and time.  He is in good spirits.  His weight is unchanged.  HENT:  Mouth/Throat: No oropharyngeal exudate.  Eyes: Conjunctivae are normal.  Cardiovascular: Normal rate and regular rhythm.  No murmur heard. Pulmonary/Chest: Effort normal and breath sounds normal.  Abdominal: Soft. He exhibits no mass. There is no tenderness.  Musculoskeletal: Normal range of motion.  Neurological: He is alert and oriented to person, place, and time.  Skin: No rash noted.  Psychiatric: Mood and affect normal.    Lab Results Lab Results  Component Value Date   WBC 4.1 12/11/2017   HGB 14.2 12/11/2017   HCT 42.3 12/11/2017   MCV 83.1 12/11/2017   PLT 237 12/11/2017    Lab Results  Component Value Date   CREATININE 1.16 12/11/2017   BUN 17 12/11/2017   NA 137 12/11/2017   K 3.5 12/11/2017   CL 100 12/11/2017   CO2 29 12/11/2017    Lab Results  Component Value Date   ALT 33 12/11/2017   AST 18 12/11/2017   ALKPHOS 60 01/02/2017   BILITOT 0.7 12/11/2017    Lab Results  Component Value Date   CHOL 195 12/11/2017   HDL 41 12/11/2017   LDLCALC 111 (H) 01/02/2017   TRIG 124 12/11/2017   CHOLHDL 4.8 12/11/2017   Lab Results  Component Value Date   LABRPR NON-REACTIVE 12/11/2017   RPRTITER 1:1 06/17/2013   HIV 1 RNA Quant (copies/mL)  Date Value  12/11/2017 <20 DETECTED (A)  09/12/2017 <20 NOT DETECTED  01/02/2017 <20 NOT DETECTED   CD4 T Cell Abs (/uL)  Date Value  12/11/2017 550  09/12/2017 530  01/02/2017 680     Problem List Items Addressed This Visit      High   Human immunodeficiency virus (HIV) disease (HCC) (Chronic)    His infection is under  excellent, long-term control.  I gave him the option of changing to [facility] which he can take with or without food but he prefers to continue Genvoya.  He will follow-up after lab work in 1 year.      Relevant Orders   CBC   T-helper cell (CD4)- ([facility] clinic only)   Comprehensive metabolic panel   Lipid panel   RPR   HIV 1 RNA quant-no reflex-bld     Unprioritized   Cigarette smoker  I encouraged him to look for healthy outlets for his stress and consider quitting cigarettes completely.      HTN (hypertension)    His blood pressure is at goal on his new antihypertensive regimen.      Obesity    I encouraged him to try to incorporate regular exercise into his weekly regimen.           Douglas AstersJohn Towanda Hornstein, MD [facility] [phone] pager   [phone] 01/01/2018, 8:57 AM

## 2018-01-01 NOTE — Assessment & Plan Note (Signed)
I encouraged him to look for healthy outlets for his stress and consider quitting cigarettes completely.

## 2018-01-01 NOTE — Progress Notes (Signed)
Refilled pt losartan. Will have clinic call pt back.

## 2018-01-01 NOTE — Assessment & Plan Note (Signed)
I encouraged him to try to incorporate regular exercise into his weekly regimen.

## 2018-01-09 ENCOUNTER — Other Ambulatory Visit: Payer: Self-pay | Admitting: Family Medicine

## 2018-01-09 ENCOUNTER — Ambulatory Visit: Payer: BLUE CROSS/BLUE SHIELD | Admitting: Family Medicine

## 2018-01-09 ENCOUNTER — Encounter: Payer: Self-pay | Admitting: Family Medicine

## 2018-01-09 DIAGNOSIS — I10 Essential (primary) hypertension: Secondary | ICD-10-CM

## 2018-01-09 MED ORDER — EPINEPHRINE 0.3 MG/0.3ML IJ SOAJ: 0.3000 mg | Freq: Once | INTRAMUSCULAR | 0 refills | Status: AC

## 2018-01-09 MED ORDER — HYDROCHLOROTHIAZIDE 25 MG PO TABS: 25.0000 mg | ORAL_TABLET | Freq: Two times a day (BID) | ORAL | 3 refills | Status: DC

## 2018-01-09 MED ORDER — LOSARTAN POTASSIUM 100 MG PO TABS: 100.0000 mg | ORAL_TABLET | Freq: Every day | ORAL | 11 refills | Status: DC

## 2018-01-09 NOTE — Telephone Encounter (Signed)
Blood pressure meds were sent to the wrong pharmacy.  [facility] [address]

## 2018-01-09 NOTE — Progress Notes (Signed)
Sent rx to pt requested pharmacy.

## 2018-01-09 NOTE — Telephone Encounter (Signed)
Please call patient and inform him that [facility] thank you

## 2018-01-28 ENCOUNTER — Telehealth: Payer: Self-pay

## 2018-01-28 NOTE — Telephone Encounter (Signed)
Placed FMLA paperwork in nurse pool form slot.

## 2018-01-28 NOTE — Telephone Encounter (Signed)
Completed FMLA forms left in RN box. Pt called and informed can pick up at front desk. Copies of FMLA forms placed in to be scanned. Shawna OrleansMeredith B Thomsen, RN

## 2018-02-05 ENCOUNTER — Ambulatory Visit: Payer: BLUE CROSS/BLUE SHIELD | Admitting: Family Medicine

## 2018-05-19 ENCOUNTER — Encounter: Payer: Self-pay | Admitting: Family Medicine

## 2018-07-31 ENCOUNTER — Encounter: Payer: Self-pay | Admitting: Family Medicine

## 2018-08-04 DIAGNOSIS — M5010 Cervical disc disorder with radiculopathy, unspecified cervical region: Secondary | ICD-10-CM | POA: Diagnosis not present

## 2018-08-05 ENCOUNTER — Other Ambulatory Visit: Payer: Self-pay | Admitting: Family Medicine

## 2018-08-05 DIAGNOSIS — I10 Essential (primary) hypertension: Secondary | ICD-10-CM

## 2018-08-19 ENCOUNTER — Encounter: Payer: BLUE CROSS/BLUE SHIELD | Admitting: Family Medicine

## 2018-09-15 ENCOUNTER — Ambulatory Visit (INDEPENDENT_AMBULATORY_CARE_PROVIDER_SITE_OTHER): Payer: 59 | Admitting: Family Medicine

## 2018-09-15 ENCOUNTER — Other Ambulatory Visit: Payer: Self-pay

## 2018-09-15 ENCOUNTER — Encounter: Payer: Self-pay | Admitting: Family Medicine

## 2018-09-15 VITALS — BP 124/78 | HR 67 | Temp 98.7°F | Ht 72.0 in | Wt 261.0 lb

## 2018-09-15 DIAGNOSIS — R079 Chest pain, unspecified: Secondary | ICD-10-CM

## 2018-09-15 DIAGNOSIS — R0789 Other chest pain: Secondary | ICD-10-CM

## 2018-09-15 DIAGNOSIS — R69 Illness, unspecified: Secondary | ICD-10-CM | POA: Diagnosis not present

## 2018-09-15 DIAGNOSIS — F17210 Nicotine dependence, cigarettes, uncomplicated: Secondary | ICD-10-CM | POA: Diagnosis not present

## 2018-09-15 DIAGNOSIS — I10 Essential (primary) hypertension: Secondary | ICD-10-CM

## 2018-09-15 DIAGNOSIS — Z23 Encounter for immunization: Secondary | ICD-10-CM | POA: Diagnosis not present

## 2018-09-15 MED ORDER — VARENICLINE TARTRATE 0.5 MG X 11 & 1 MG X 42 PO MISC: ORAL | 0 refills | Status: DC

## 2018-09-15 NOTE — Progress Notes (Signed)
    Subjective:  Douglas [name] is a 37 y.o. male who presents to the [facility] today for his annual physical.    HPI:  SUBJECTIVE:  Douglas [name] is a 37 y.o. male presenting for his annual checkup. He does endorse some chest pain.  Is been occurring for the past 3 months.  It is not exertional.  Does happen late at night.  It is not associated with dyspnea, cough.  It has happened 3 times over the past month.  Is not worsening.  He does not know what seems to bring it on.  It is substernal.  It does not radiate anywhere.  Does not have any history of inciting trauma.  Not associate with any diaphoresis.  Patient does have a family history of father and 3 uncles all having heart attacks at or before the age of 65.  Current Outpatient Medications  Medication Sig Dispense Refill  . elvitegravir-cobicistat-emtricitabine-tenofovir (GENVOYA) 150-150-200-10 MG TABS tablet Take 1 tablet by mouth daily with breakfast. 30 tablet 12  . hydrochlorothiazide (HYDRODIURIL) 25 MG tablet TAKE 1 TABLET BY MOUTH TWICE A DAY 90 tablet 0  . losartan (COZAAR) 100 MG tablet Take 1 tablet (100 mg total) by mouth daily. 30 tablet 11  . varenicline (CHANTIX STARTING MONTH PAK) 0.5 MG X 11 & 1 MG X 42 tablet Take one 0.5 mg tablet by mouth once daily for 3 days, then increase to one 0.5 mg tablet twice daily for 4 days, then increase to one 1 mg tablet twice daily. 53 tablet 0   No current facility-administered medications for this visit.    Allergies: Shellfish allergy   ROS:  Feeling well. No abdominal pain, change in bowel habits, black or bloody stools. No urinary tract or prostatic symptoms. No neurological complaints.  OBJECTIVE:  The patient appears well, alert, oriented x 3, in no distress.  BP 124/78   Pulse 67   Temp 98.7 F (37.1 C) (Oral)   Ht 6' (1.829 m)   Wt 261 lb (118.4 kg)   SpO2 97%   BMI 35.40 kg/m  ENT normal.  Neck supple. No adenopathy or thyromegaly. PERLA. Lungs are clear, good air  entry, no wheezes, rhonchi or rales. S1 and S2 normal, no murmurs, regular rate and rhythm. Abdomen is soft without tenderness, guarding, mass or organomegaly. GU exam: Deferred. Extremities show no edema, normal peripheral pulses. Neurological is normal without focal findings.  ROS: Per HPI  PMH: Smoking history reviewed.    Assessment/Plan:  Atypical chest pain Atypical chest pain.  Seems less likely cardiac, however strong family history and risk factors of race after American, history of smoking, hypertension.  Assess further with exercise stress test.  HTN (hypertension) Well-controlled.  Continue current medication.  Assess renal function.  Cigarette smoker Patient wants to try to quit.  Will trial Chantix.    Lab Orders     CBC with Differential     Comprehensive metabolic panel  Meds ordered this encounter  Medications  . varenicline (CHANTIX STARTING MONTH PAK) 0.5 MG X 11 & 1 MG X 42 tablet    Sig: Take one 0.5 mg tablet by mouth once daily for 3 days, then increase to one 0.5 mg tablet twice daily for 4 days, then increase to one 1 mg tablet twice daily.    Dispense:  53 tablet    Refill:  0      Thomes Dinning, MD, MS FAMILY MEDICINE RESIDENT - PGY2 09/18/2018 11:30 AM

## 2018-09-15 NOTE — Patient Instructions (Signed)
It was a pleasure to see you today! Thank you for choosing [facility] for your primary care. RHONDA VANGIESON was seen for annual physical.   For your chest pain, - we are scheduling you for a stress test. We will call you when it is scheduled.  - we are also getting routine labs to assess kidney function and blood counts  For your smoking,  - we are starting you on Chantix.   Best,  Thomes Dinning, MD, MS FAMILY MEDICINE RESIDENT - PGY2 09/15/2018 1:54 PM

## 2018-09-16 LAB — CBC WITH DIFFERENTIAL/PLATELET
Basophils Absolute: 0 10*3/uL (ref 0.0–0.2)
Basos: 1 %
EOS (ABSOLUTE): 0.3 10*3/uL (ref 0.0–0.4)
EOS: 6 %
Hematocrit: 43.5 % (ref 37.5–51.0)
Hemoglobin: 14.7 g/dL (ref 13.0–17.7)
IMMATURE GRANULOCYTES: 0 %
Immature Grans (Abs): 0 10*3/uL (ref 0.0–0.1)
Lymphocytes Absolute: 1.5 10*3/uL (ref 0.7–3.1)
Lymphs: 32 %
MCH: 28.8 pg (ref 26.6–33.0)
MCHC: 33.8 g/dL (ref 31.5–35.7)
MCV: 85 fL (ref 79–97)
MONOS ABS: 0.6 10*3/uL (ref 0.1–0.9)
Monocytes: 13 %
NEUTROS PCT: 48 %
Neutrophils Absolute: 2.3 10*3/uL (ref 1.4–7.0)
PLATELETS: 260 10*3/uL (ref 150–450)
RBC: 5.11 x10E6/uL (ref 4.14–5.80)
RDW: 14.1 % (ref 12.3–15.4)
WBC: 4.7 10*3/uL (ref 3.4–10.8)

## 2018-09-16 LAB — COMPREHENSIVE METABOLIC PANEL
ALT: 27 IU/L (ref 0–44)
AST: 20 IU/L (ref 0–40)
Albumin/Globulin Ratio: 1.8 (ref 1.2–2.2)
Albumin: 4.6 g/dL (ref 3.5–5.5)
Alkaline Phosphatase: 52 IU/L (ref 39–117)
BUN/Creatinine Ratio: 14 (ref 9–20)
BUN: 16 mg/dL (ref 6–20)
Bilirubin Total: 0.4 mg/dL (ref 0.0–1.2)
CALCIUM: 9.9 mg/dL (ref 8.7–10.2)
CO2: 27 mmol/L (ref 20–29)
CREATININE: 1.13 mg/dL (ref 0.76–1.27)
Chloride: 99 mmol/L (ref 96–106)
GFR calc Af Amer: 95 mL/min/{1.73_m2} (ref >59)
GFR, EST NON AFRICAN AMERICAN: 83 mL/min/{1.73_m2} (ref >59)
Globulin, Total: 2.6 g/dL (ref 1.5–4.5)
Glucose: 91 mg/dL (ref 65–99)
Potassium: 3.7 mmol/L (ref 3.5–5.2)
Sodium: 141 mmol/L (ref 134–144)
Total Protein: 7.2 g/dL (ref 6.0–8.5)

## 2018-09-17 ENCOUNTER — Encounter: Payer: Self-pay | Admitting: Family Medicine

## 2018-09-18 DIAGNOSIS — R0789 Other chest pain: Secondary | ICD-10-CM | POA: Insufficient documentation

## 2018-09-18 NOTE — Assessment & Plan Note (Addendum)
Atypical chest pain.  Seems less likely cardiac, however strong family history and risk factors of race after American, history of smoking, hypertension.  Assess further with exercise stress test.

## 2018-09-18 NOTE — Assessment & Plan Note (Signed)
Patient wants to try to quit.  Will trial Chantix.

## 2018-09-18 NOTE — Assessment & Plan Note (Signed)
Well-controlled.  Continue current medication.  Assess renal function.

## 2018-09-21 NOTE — Addendum Note (Signed)
Addended by: Henri Medal on: 09/21/2018 09:34 AM   Modules accepted: Orders

## 2018-09-25 ENCOUNTER — Encounter (HOSPITAL_COMMUNITY): Payer: 59

## 2018-09-29 ENCOUNTER — Encounter: Payer: Self-pay | Admitting: Family Medicine

## 2018-09-29 ENCOUNTER — Ambulatory Visit (INDEPENDENT_AMBULATORY_CARE_PROVIDER_SITE_OTHER): Payer: 59

## 2018-09-29 DIAGNOSIS — R0789 Other chest pain: Secondary | ICD-10-CM | POA: Diagnosis not present

## 2018-09-29 LAB — EXERCISE TOLERANCE TEST
CHL CUP MPHR: 183 {beats}/min
CHL CUP RESTING HR STRESS: 80 {beats}/min
CHL RATE OF PERCEIVED EXERTION: 17
CSEPEDS: 0 s
Estimated workload: 10.1 METS
Exercise duration (min): 9 min
Peak HR: 171 {beats}/min
Percent HR: 93 %

## 2018-09-30 ENCOUNTER — Other Ambulatory Visit: Payer: Self-pay | Admitting: Family Medicine

## 2018-09-30 DIAGNOSIS — I10 Essential (primary) hypertension: Secondary | ICD-10-CM

## 2018-10-07 ENCOUNTER — Other Ambulatory Visit: Payer: Self-pay | Admitting: Family Medicine

## 2018-10-07 DIAGNOSIS — I10 Essential (primary) hypertension: Secondary | ICD-10-CM

## 2018-10-08 MED ORDER — HYDROCHLOROTHIAZIDE 25 MG PO TABS: 25.0000 mg | ORAL_TABLET | Freq: Two times a day (BID) | ORAL | 0 refills | Status: DC

## 2018-10-10 ENCOUNTER — Other Ambulatory Visit: Payer: Self-pay | Admitting: Family Medicine

## 2018-10-10 DIAGNOSIS — I10 Essential (primary) hypertension: Secondary | ICD-10-CM

## 2018-10-20 ENCOUNTER — Ambulatory Visit: Payer: 59 | Admitting: Family Medicine

## 2018-12-03 ENCOUNTER — Other Ambulatory Visit: Payer: Self-pay

## 2018-12-03 DIAGNOSIS — B20 Human immunodeficiency virus [HIV] disease: Secondary | ICD-10-CM

## 2018-12-03 MED ORDER — ELVITEG-COBIC-EMTRICIT-TENOFAF 150-150-200-10 MG PO TABS: 1.0000 | ORAL_TABLET | Freq: Every day | ORAL | 12 refills | Status: DC

## 2018-12-08 ENCOUNTER — Other Ambulatory Visit: Payer: Self-pay

## 2018-12-08 DIAGNOSIS — B20 Human immunodeficiency virus [HIV] disease: Secondary | ICD-10-CM

## 2018-12-08 MED ORDER — ELVITEG-COBIC-EMTRICIT-TENOFAF 150-150-200-10 MG PO TABS: 1.0000 | ORAL_TABLET | Freq: Every day | ORAL | 12 refills | Status: DC

## 2018-12-08 NOTE — Progress Notes (Signed)
Patient called for refill request. Request sent electronically with  x5 refills. Patient to follow up with Dr. Orvan Falconer in 1 year.

## 2018-12-16 ENCOUNTER — Other Ambulatory Visit: Payer: 59

## 2018-12-16 DIAGNOSIS — R69 Illness, unspecified: Secondary | ICD-10-CM | POA: Diagnosis not present

## 2018-12-16 DIAGNOSIS — B20 Human immunodeficiency virus [HIV] disease: Secondary | ICD-10-CM

## 2018-12-17 LAB — T-HELPER CELL (CD4) - (RCID CLINIC ONLY)
CD4 % Helper T Cell: 38 % (ref 33–55)
CD4 T Cell Abs: 590 /uL (ref 400–2700)

## 2018-12-18 LAB — HIV-1 RNA QUANT-NO REFLEX-BLD
HIV 1 RNA Quant: <20 copies/mL
HIV-1 RNA Quant, Log: <1.3 Log copies/mL

## 2018-12-18 LAB — COMPREHENSIVE METABOLIC PANEL
AG Ratio: 1.5 (calc) (ref 1.0–2.5)
ALT: 29 U/L (ref 9–46)
AST: 17 U/L (ref 10–40)
Albumin: 4.3 g/dL (ref 3.6–5.1)
Alkaline phosphatase (APISO): 48 U/L (ref 40–115)
BUN: 15 mg/dL (ref 7–25)
CO2: 30 mmol/L (ref 20–32)
Calcium: 9.6 mg/dL (ref 8.6–10.3)
Chloride: 104 mmol/L (ref 98–110)
Creat: 1.02 mg/dL (ref 0.60–1.35)
Globulin: 2.9 g/dL (calc) (ref 1.9–3.7)
Glucose, Bld: 105 mg/dL — ABNORMAL HIGH (ref 65–99)
Potassium: 4.1 mmol/L (ref 3.5–5.3)
Sodium: 142 mmol/L (ref 135–146)
Total Bilirubin: 0.5 mg/dL (ref 0.2–1.2)
Total Protein: 7.2 g/dL (ref 6.1–8.1)

## 2018-12-18 LAB — CBC
HCT: 42.4 % (ref 38.5–50.0)
Hemoglobin: 14.2 g/dL (ref 13.2–17.1)
MCH: 28.1 pg (ref 27.0–33.0)
MCHC: 33.5 g/dL (ref 32.0–36.0)
MCV: 84 fL (ref 80.0–100.0)
MPV: 11.3 fL (ref 7.5–12.5)
Platelets: 253 10*3/uL (ref 140–400)
RBC: 5.05 10*6/uL (ref 4.20–5.80)
RDW: 13 % (ref 11.0–15.0)
WBC: 5.3 10*3/uL (ref 3.8–10.8)

## 2018-12-18 LAB — LIPID PANEL
Cholesterol: 199 mg/dL (ref <200)
HDL: 49 mg/dL (ref >40)
LDL Cholesterol (Calc): 128 mg/dL (calc) — ABNORMAL HIGH
Non-HDL Cholesterol (Calc): 150 mg/dL (calc) — ABNORMAL HIGH (ref <130)
TRIGLYCERIDES: 112 mg/dL (ref <150)
Total CHOL/HDL Ratio: 4.1 (calc) (ref <5.0)

## 2018-12-18 LAB — RPR: RPR: NONREACTIVE

## 2018-12-30 ENCOUNTER — Ambulatory Visit (INDEPENDENT_AMBULATORY_CARE_PROVIDER_SITE_OTHER): Payer: 59 | Admitting: Internal Medicine

## 2018-12-30 ENCOUNTER — Encounter: Payer: Self-pay | Admitting: Internal Medicine

## 2018-12-30 DIAGNOSIS — B20 Human immunodeficiency virus [HIV] disease: Secondary | ICD-10-CM | POA: Diagnosis not present

## 2018-12-30 DIAGNOSIS — E6609 Other obesity due to excess calories: Secondary | ICD-10-CM

## 2018-12-30 DIAGNOSIS — F17210 Nicotine dependence, cigarettes, uncomplicated: Secondary | ICD-10-CM | POA: Diagnosis not present

## 2018-12-30 DIAGNOSIS — R69 Illness, unspecified: Secondary | ICD-10-CM | POA: Diagnosis not present

## 2018-12-30 MED ORDER — BICTEGRAVIR-EMTRICITAB-TENOFOV 50-200-25 MG PO TABS: 1.0000 | ORAL_TABLET | Freq: Every day | ORAL | 11 refills | Status: DC

## 2018-12-30 NOTE — Assessment & Plan Note (Signed)
His infection remains under excellent, long-term control.  I will change Genvoya to [place] which he can take with or without food.  His long-term partner is HIV positive and suppressed on Genvoya.  He will follow-up after lab work in 1 year.

## 2018-12-30 NOTE — Assessment & Plan Note (Signed)
I congratulated him on quitting. 

## 2018-12-30 NOTE — Assessment & Plan Note (Signed)
I told him that we will try to work together over the next few years to increase his physical activity and help control his weight, especially because of his history of diabetes in his family.

## 2018-12-30 NOTE — Progress Notes (Signed)
Patient Active Problem List   Diagnosis Date Noted  . Human immunodeficiency virus (HIV) disease (HCC) 06/25/2013    Priority: High  . Atypical chest pain 09/18/2018  . Obesity 01/01/2018  . Cigarette smoker 12/20/2015  . Screening examination for venereal disease 07/27/2014  . Encounter for long-term (current) use of other medications 07/27/2014  . Seborrheic dermatitis 08/19/2013  . HTN (hypertension) 06/25/2013    Patient's Medications  New Prescriptions   BICTEGRAVIR-EMTRICITABINE-TENOFOVIR AF (BIKTARVY) 50-200-25 MG TABS TABLET    Take 1 tablet by mouth daily.  Previous Medications   HYDROCHLOROTHIAZIDE (HYDRODIURIL) 25 MG TABLET    TAKE 1 TABLET(25 MG) BY MOUTH TWICE DAILY   LOSARTAN (COZAAR) 100 MG TABLET    Take 1 tablet (100 mg total) by mouth daily.  Modified Medications   No medications on file  Discontinued Medications   ELVITEGRAVIR-COBICISTAT-EMTRICITABINE-TENOFOVIR (GENVOYA) 150-150-200-10 MG TABS TABLET    Take 1 tablet by mouth daily with breakfast.   VARENICLINE (CHANTIX STARTING MONTH PAK) 0.5 MG X 11 & 1 MG X 42 TABLET    Take one 0.5 mg tablet by mouth once daily for 3 days, then increase to one 0.5 mg tablet twice daily for 4 days, then increase to one 1 mg tablet twice daily.    Subjective: Douglas [name] is in for his routine HIV follow-up visit.  He has had no problems obtaining, taking or tolerating his Genvoya.  He usually takes it in the morning but occasionally forgets and takes it later in the afternoon.  He does not recall missing a dose.  He usually takes it on empty stomach.  He was able to quit smoking cigarettes 2 months ago.  He quit cold [place].  He is feeling better and happy to be saving so much money.  His mother is doing better.  He enrolled her in the [facility] program.  He is a little concerned that he is gained more weight since quitting cigarettes.  He does have a history of diabetes in his family on both sides.  His father died of a heart  attack years ago.  He has received his influenza vaccine already.  Review of Systems: Review of Systems  Constitutional: Negative for chills, diaphoresis, fever, malaise/fatigue and weight loss.  HENT: Negative for sore throat.   Respiratory: Negative for cough, sputum production and shortness of breath.   Cardiovascular: Negative for chest pain.  Gastrointestinal: Negative for abdominal pain, diarrhea, heartburn, nausea and vomiting.  Genitourinary: Negative for dysuria and frequency.  Musculoskeletal: Negative for joint pain and myalgias.  Skin: Negative for rash.  Neurological: Negative for dizziness and headaches.  Psychiatric/Behavioral: Negative for depression and substance abuse. The patient is not nervous/anxious.     Past Medical History:  Diagnosis Date  . Family history of heart disease in male family member before age 72   . HIV infection (HCC)   . Hypertension   . Low HDL (under 40)   . Seasonal allergic rhinitis    weather changes, spring/fall  . Sickle cell trait (HCC)     Social History   Tobacco Use  . Smoking status: Former Smoker    Packs/day: 0.50    Start date: 09/03/2013    Last attempt to quit: 11/14/2017    Years since quitting: 1.1  . Smokeless tobacco: Never Used  . Tobacco comment: stress at his job  Substance Use Topics  . Alcohol use: Yes    Alcohol/week: 5.0 standard drinks  Types: 2 Cans of beer, 3 Shots of liquor per week    Comment: shots on the weekends, 24oz of beer daily  . Drug use: No    Family History  Problem Relation Age of Onset  . Hypertension Mother   . Stroke Mother   . Heart disease Father 5051       died of MI  . Diabetes Father   . Hypertension Sister   . Diabetes Sister   . Heart disease Paternal Uncle        several uncles died in 7450s with MI  . Hypertension Paternal Uncle   . Hypertension Maternal Grandmother   . Stroke Maternal Grandmother   . Hypertension Maternal Grandfather   . Cancer Paternal  Grandmother        lung    Allergies  Allergen Reactions  . Shellfish Allergy Anaphylaxis    Health Maintenance  Topic Date Due  . TETANUS/TDAP  06/10/2023  . INFLUENZA VACCINE  Completed  . HIV Screening  Completed    Objective:  Vitals:   12/30/18 0900  BP: 136/88  Pulse: 77  Temp: 97.9 F (36.6 C)  Weight: 261 lb (118.4 kg)   Body mass index is 35.4 kg/m.  Physical Exam Constitutional:      Comments: He is in good spirits today.  HENT:     Mouth/Throat:     Pharynx: No oropharyngeal exudate.  Eyes:     Conjunctiva/sclera: Conjunctivae normal.  Cardiovascular:     Rate and Rhythm: Normal rate and regular rhythm.     Heart sounds: No murmur.  Pulmonary:     Effort: Pulmonary effort is normal.     Breath sounds: Normal breath sounds.  Abdominal:     Palpations: Abdomen is soft. There is no mass.     Tenderness: There is no abdominal tenderness.  Musculoskeletal: Normal range of motion.  Skin:    Findings: No rash.  Neurological:     Mental Status: He is alert and oriented to person, place, and time.  Psychiatric:        Mood and Affect: Mood normal.     Lab Results Lab Results  Component Value Date   WBC 5.3 12/16/2018   HGB 14.2 12/16/2018   HCT 42.4 12/16/2018   MCV 84.0 12/16/2018   PLT 253 12/16/2018    Lab Results  Component Value Date   CREATININE 1.02 12/16/2018   BUN 15 12/16/2018   NA 142 12/16/2018   K 4.1 12/16/2018   CL 104 12/16/2018   CO2 30 12/16/2018    Lab Results  Component Value Date   ALT 29 12/16/2018   AST 17 12/16/2018   ALKPHOS 52 09/15/2018   BILITOT 0.5 12/16/2018    Lab Results  Component Value Date   CHOL 199 12/16/2018   HDL 49 12/16/2018   LDLCALC 128 (H) 12/16/2018   TRIG 112 12/16/2018   CHOLHDL 4.1 12/16/2018   Lab Results  Component Value Date   LABRPR NON-REACTIVE 12/16/2018   RPRTITER 1:1 06/17/2013   HIV 1 RNA Quant (copies/mL)  Date Value  12/16/2018 <20 NOT DETECTED  12/11/2017 <20  DETECTED (A)  09/12/2017 <20 NOT DETECTED   CD4 T Cell Abs (/uL)  Date Value  12/16/2018 590  12/11/2017 550  09/12/2017 530     Problem List Items Addressed This Visit      High   Human immunodeficiency virus (HIV) disease (HCC) (Chronic)    His infection remains under excellent, long-term  control.  I will change Genvoya to [place] which he can take with or without food.  His long-term partner is HIV positive and suppressed on Genvoya.  He will follow-up after lab work in 1 year.      Relevant Medications   bictegravir-emtricitabine-tenofovir AF (BIKTARVY) 50-200-25 MG TABS tablet   Other Relevant Orders   CBC   T-helper cell (CD4)- ([facility] clinic only)   Comprehensive metabolic panel   Lipid panel   RPR   HIV-1 RNA quant-no reflex-bld     Unprioritized   Obesity    I told him that we will try to work together over the next few years to increase his physical activity and help control his weight, especially because of his history of diabetes in his family.      Cigarette smoker    I congratulated him on quitting.           Cliffton AstersJohn Gaven Eugene, MD [facility] [phone] pager   [phone] 12/30/2018, 9:22 AM

## 2019-01-06 ENCOUNTER — Telehealth: Payer: Self-pay

## 2019-01-06 NOTE — Telephone Encounter (Signed)
Received faxed PA for patient's Biktarvy from [employer]. PA was submitted to [phone].Will inform pharmacy of PA decision.  Lorenso CourierJose L Keenan Trefry, [place]

## 2019-01-07 NOTE — Telephone Encounter (Signed)
Coverage determination for Biktarvy 50mg -200mg  tablet: Insurance needs additional information. Requested information faxed to [phone].  Valarie ConesShaquenia Quetzaly Ebner, LPN

## 2019-01-08 NOTE — Telephone Encounter (Signed)
PA approved through 01/07/20 for 12 refills of 30 units. Called [facility] speciality pharmacy and spoke with [facility], Pharmacist. Pharmacist states they will order medication today and have filled for patient Monday.  Lorenso Courier, [place]

## 2019-01-09 ENCOUNTER — Other Ambulatory Visit: Payer: Self-pay | Admitting: Family Medicine

## 2019-01-28 ENCOUNTER — Telehealth: Payer: Self-pay | Admitting: Family Medicine

## 2019-01-28 NOTE — Telephone Encounter (Signed)
Pt has new insurance and would like to come back to see [place] again.  He is aware he must keep his appointments.

## 2019-02-08 ENCOUNTER — Other Ambulatory Visit: Payer: Self-pay | Admitting: Behavioral Health

## 2019-02-08 DIAGNOSIS — B20 Human immunodeficiency virus [HIV] disease: Secondary | ICD-10-CM

## 2019-02-08 MED ORDER — BICTEGRAVIR-EMTRICITAB-TENOFOV 50-200-25 MG PO TABS: 1.0000 | ORAL_TABLET | Freq: Every day | ORAL | 10 refills | Status: DC

## 2019-02-11 ENCOUNTER — Other Ambulatory Visit: Payer: Self-pay

## 2019-02-11 DIAGNOSIS — I10 Essential (primary) hypertension: Secondary | ICD-10-CM

## 2019-02-12 ENCOUNTER — Telehealth: Payer: Self-pay | Admitting: Family Medicine

## 2019-02-12 MED ORDER — LOSARTAN POTASSIUM 100 MG PO TABS: 100.0000 mg | ORAL_TABLET | Freq: Every day | ORAL | 3 refills | Status: DC

## 2019-02-12 MED ORDER — HYDROCHLOROTHIAZIDE 25 MG PO TABS: ORAL_TABLET | ORAL | 3 refills | Status: DC

## 2019-02-12 NOTE — Telephone Encounter (Signed)
Entered in error

## 2019-03-09 ENCOUNTER — Encounter: Payer: Self-pay | Admitting: Family Medicine

## 2019-03-26 HISTORY — PX: NO PAST SURGERIES: SHX2092

## 2019-04-14 ENCOUNTER — Ambulatory Visit (INDEPENDENT_AMBULATORY_CARE_PROVIDER_SITE_OTHER): Payer: 59 | Admitting: Medical

## 2019-04-14 ENCOUNTER — Other Ambulatory Visit: Payer: Self-pay

## 2019-04-14 ENCOUNTER — Encounter: Payer: Self-pay | Admitting: Medical

## 2019-04-14 VITALS — BP 130/80 | HR 79 | Temp 98.7°F | Resp 16 | Ht 73.0 in | Wt 265.8 lb

## 2019-04-14 DIAGNOSIS — Z8249 Family history of ischemic heart disease and other diseases of the circulatory system: Secondary | ICD-10-CM

## 2019-04-14 DIAGNOSIS — Z0000 Encounter for general adult medical examination without abnormal findings: Secondary | ICD-10-CM | POA: Diagnosis not present

## 2019-04-14 DIAGNOSIS — B20 Human immunodeficiency virus [HIV] disease: Secondary | ICD-10-CM

## 2019-04-14 DIAGNOSIS — E66811 Obesity, class 1: Secondary | ICD-10-CM

## 2019-04-14 DIAGNOSIS — E6609 Other obesity due to excess calories: Secondary | ICD-10-CM | POA: Diagnosis not present

## 2019-04-14 DIAGNOSIS — I10 Essential (primary) hypertension: Secondary | ICD-10-CM

## 2019-04-14 DIAGNOSIS — Z139 Encounter for screening, unspecified: Secondary | ICD-10-CM

## 2019-04-14 DIAGNOSIS — Z131 Encounter for screening for diabetes mellitus: Secondary | ICD-10-CM

## 2019-04-14 LAB — POCT URINALYSIS DIP (PROADVANTAGE DEVICE)
Bilirubin, UA: NEGATIVE
Blood, UA: NEGATIVE
Glucose, UA: NEGATIVE mg/dL
Ketones, POC UA: NEGATIVE mg/dL
Leukocytes, UA: NEGATIVE
Nitrite, UA: NEGATIVE
Protein Ur, POC: NEGATIVE mg/dL
Specific Gravity, Urine: 1.015
Urobilinogen, Ur: NEGATIVE
pH, UA: 7.5 (ref 5.0–8.0)

## 2019-04-14 NOTE — Patient Instructions (Signed)
RESOURCES in [place], [place]  If you are experiencing a mental health crisis or an emergency, please call 911 or go to the nearest emergency department.  [facility]   [phone] [facility]  [phone] [facility]   [phone]  Suicide Hotline 1-800-Suicide 619-704-0529)  National Suicide Prevention Lifeline 701-294-0742  862-547-7255)  Domestic Violence, Rape/Crisis - [facility] [place] 081-448-1856  [employer] Violence Hotline 1-800-799-SAFE 825-326-9784)  To report Child or Elder Abuse, please call: [facility] Police Department  [phone] [facility] Department  [phone]  [facility] Crisis Line [phone]  Teen Crisis line [phone] or [phone]       Counseling Services (NON- psychiatrist offices)  [facility] [address], [place], [place] [address] [phone]   [facility] [phone] [address], [place], [place] [address]   [facility] [phone]  [email or web address] [address], [address] [place], [place], [place] [address]   Lenise Arena. Charlyne Mom, therapist [phone] [address] [place], [place] [address]   [facility] [phone] [address], [place], [place] [address]   Glade Lloyd, therapist [phone] [address], [place], [place] [address]   [facility] [phone] [address] [place], [place], [place] [address]
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
## 2019-04-15 ENCOUNTER — Other Ambulatory Visit: Payer: Self-pay | Admitting: Medical

## 2019-04-15 LAB — HEMOGLOBIN A1C
Est. average glucose Bld gHb Est-mCnc: 117 mg/dL
Hgb A1c MFr Bld: 5.7 % — ABNORMAL HIGH (ref 4.8–5.6)

## 2019-04-15 LAB — HEPATITIS B SURFACE ANTIBODY, QUANTITATIVE: Hepatitis B Surf Ab Quant: 102.8 m[IU]/mL (ref >9.9)

## 2019-04-15 MED ORDER — ROSUVASTATIN CALCIUM 10 MG PO TABS: 10.0000 mg | ORAL_TABLET | Freq: Every day | ORAL | 0 refills | Status: DC

## 2019-04-22 ENCOUNTER — Ambulatory Visit (INDEPENDENT_AMBULATORY_CARE_PROVIDER_SITE_OTHER): Payer: 59

## 2019-04-22 ENCOUNTER — Other Ambulatory Visit: Payer: Self-pay

## 2019-04-22 ENCOUNTER — Ambulatory Visit (HOSPITAL_COMMUNITY)
Admission: EM | Admit: 2019-04-22 | Discharge: 2019-04-22 | Disposition: A | Discharge status: Home / Self Care | Payer: 59 | Attending: Family Medicine | Admitting: Family Medicine

## 2019-04-22 ENCOUNTER — Encounter (HOSPITAL_COMMUNITY): Payer: Self-pay

## 2019-04-22 DIAGNOSIS — S93602A Unspecified sprain of left foot, initial encounter: Secondary | ICD-10-CM | POA: Diagnosis not present

## 2019-04-22 DIAGNOSIS — M25572 Pain in left ankle and joints of left foot: Secondary | ICD-10-CM | POA: Diagnosis not present

## 2019-04-22 NOTE — Discharge Instructions (Signed)
You may use over the counter ibuprofen or acetaminophen as needed.  ° °

## 2019-04-22 NOTE — ED Provider Notes (Signed)
[facility]   [id number] 04/22/19 Arrival Time: 1837  ASSESSMENT & PLAN:  1. Sprain of left foot, initial encounter    I have personally viewed the imaging studies ordered this visit. No fractures appreciated. Discussed.  Orders Placed This Encounter  Procedures  . DG Foot Complete Left   WBAT Rest the injured area as much as practical. Ice. OTC ibuprofen with food as needed.  Follow-up Information    Tysinger, Kermit Balo, PA-C.   Specialty:  Family Medicine Why:  As needed. Contact information: [address] [place] [address] [phone]        [facility].   Why:  Schedule follow up if not improving over the next several days to one week. Contact information: [address] [place] [address] [phone]         Reviewed expectations re: course of current medical issues. Questions answered. Outlined signs and symptoms indicating need for more acute intervention. Patient verbalized understanding. After Visit Summary given.  SUBJECTIVE: History from: patient. Douglas [name] is a 38 y.o. male who reports persistent moderate pain of his left foot; described as aching without radiation. Onset: abrupt, today. Injury/trama: yes, reports mowing lawn in flip-flops; tripped and "rolled" foot/ankle; able to bear weight but with discomfort; mild swelling noted. Symptoms have progressed to a point and plateaued since beginning. Aggravating factors: bearing weight. Alleviating factors: none reported. Associated symptoms: none reported. Extremity sensation changes or weakness: none. Self treatment: has not tried OTCs for relief of pain. History of similar: no.  Past Surgical History:  Procedure Laterality Date  . NO PAST SURGERIES  03/2019     ROS: As per HPI.   OBJECTIVE:  Vitals:   04/22/19 1855 04/22/19 1858  BP: 129/89   Pulse: 89   Resp: 18   Temp: 99.3 F (37.4 C)   TempSrc:  Oral   SpO2: 98%   Weight:  117.9 kg    General appearance: alert; no distress HEENT: [place]; AT Neck: supple with FROM Resp: unlabored respirations Extremities: . LLE: warm and well perfused; poorly localized moderate tenderness over left proximal/lateral dorsal foot; without gross deformities; with mild swelling; with no bruising; ROM: normal with reported discomfort CV: brisk extremity capillary refill of LLE; 2+ DP and PT pulse of LLE. Skin: warm and dry; no visible rashes Neurologic: gait normal; normal reflexes of RLE and LLE; normal sensation of RLE and LLE; normal strength of RLE and LLE Psychological: alert and cooperative; normal mood and affect  Imaging: Dg Foot Complete Left  Result Date: 04/22/2019 CLINICAL DATA:  Pain status post injury EXAM: LEFT FOOT - COMPLETE 3+ VIEW COMPARISON:  None. FINDINGS: There is no evidence of fracture or dislocation. There is no evidence of arthropathy or other focal bone abnormality. Soft tissues are unremarkable. IMPRESSION: Negative. Electronically Signed   By: Katherine Mantle M.D.   On: 04/22/2019 19:25    Allergies  Allergen Reactions  . Shellfish Allergy Anaphylaxis    Past Medical History:  Diagnosis Date  . Family history of heart disease in male family member before age 48   . HIV infection (HCC) 2014  . Hypertension 2004  . Low HDL (under 40)   . Seasonal allergic rhinitis    weather changes, spring/fall  . Sickle cell trait (HCC)    Social History   Socioeconomic History  . Marital status: Significant Other    Spouse name: Not on file  . Number of children:  Not on file  . Years of education: Not on file  . Highest education level: Not on file  Occupational History  . Not on file  Social Needs  . Financial resource strain: Not on file  . Food insecurity:    Worry: Not on file    Inability: Not on file  . Transportation needs:    Medical: Not on file    Non-medical: Not on file  Tobacco Use  . Smoking status:  Former Smoker    Packs/day: 0.50    Start date: 09/03/2013    Last attempt to quit: 11/14/2017    Years since quitting: 1.4  . Smokeless tobacco: Never Used  . Tobacco comment: stress at his job  Substance and Sexual Activity  . Alcohol use: Yes    Alcohol/week: 15.0 standard drinks    Types: 15 Glasses of wine per week  . Drug use: No  . Sexual activity: Yes    Partners: Male    Birth control/protection: Condom  Lifestyle  . Physical activity:    Days per week: Not on file    Minutes per session: Not on file  . Stress: Not on file  Relationships  . Social connections:    Talks on phone: Not on file    Gets together: Not on file    Attends religious service: Not on file    Active member of club or organization: Not on file    Attends meetings of clubs or organizations: Not on file    Relationship status: Not on file  Other Topics Concern  . Not on file  Social History Narrative   Psychologist, counsellingCoding and resource specialist, assist nursing with re-certifications, [employer].   Exercise - video programs.  Weight bearing exercise as well  03/2019   Family History  Problem Relation Age of Onset  . Hypertension Mother   . Stroke Mother   . Heart disease Father 6050       died of MI  . Diabetes Father   . Hypertension Sister   . Diabetes Sister        type 2  . Heart disease Paternal Uncle        several uncles died in 2150s with MI  . Hypertension Paternal Uncle   . Hypertension Maternal Grandmother   . Stroke Maternal Grandmother   . Hypertension Maternal Grandfather   . Cancer Paternal Grandmother        lung  . Heart disease Paternal Uncle        MI in 7250s   Past Surgical History:  Procedure Laterality Date  . NO PAST SURGERIES  03/2019      Mardella LaymanHagler, Mckynlee Luse, MD 04/27/19 1047

## 2019-04-22 NOTE — ED Triage Notes (Signed)
Pt states pt was mowing the grass and rolled his left ankle. Pt state its painful while walking.

## 2019-05-18 ENCOUNTER — Ambulatory Visit (INDEPENDENT_AMBULATORY_CARE_PROVIDER_SITE_OTHER): Payer: 59 | Admitting: Family Medicine

## 2019-05-18 ENCOUNTER — Other Ambulatory Visit: Payer: Self-pay

## 2019-05-18 ENCOUNTER — Encounter: Payer: Self-pay | Admitting: Family Medicine

## 2019-05-18 VITALS — BP 128/84 | HR 80 | Temp 98.7°F | Wt 267.8 lb

## 2019-05-18 DIAGNOSIS — M549 Dorsalgia, unspecified: Secondary | ICD-10-CM | POA: Diagnosis not present

## 2019-05-18 NOTE — Progress Notes (Signed)
   Subjective:    Patient ID: Douglas [name], male    DOB: May 05, 1981, 38 y.o.   MRN: [id number]  HPI He complains of a 1 day history of right flank pain that is made slightly worse with certain motions.  No numbness, tingling or weakness.  No urinary symptoms.   Review of Systems     Objective:   Physical Exam Alert and in no distress.  Slight discomfort with left lateral motion.  Normal hip motion.  Negative straight leg raising.  No tenderness palpation over the SI joint or over the paravertebral muscles.       Assessment & Plan:   Encounter Diagnosis  Name Primary?  . Musculoskeletal back pain Yes  Explained that this is musculoskeletal in nature and treated symptomatically.  He was comfortable with that

## 2019-06-04 ENCOUNTER — Other Ambulatory Visit: Payer: Self-pay

## 2019-06-04 ENCOUNTER — Ambulatory Visit (HOSPITAL_COMMUNITY)
Admission: EM | Admit: 2019-06-04 | Discharge: 2019-06-04 | Disposition: A | Discharge status: Home / Self Care | Payer: 59 | Attending: Internal Medicine | Admitting: Internal Medicine

## 2019-06-04 ENCOUNTER — Encounter (HOSPITAL_COMMUNITY): Payer: Self-pay

## 2019-06-04 DIAGNOSIS — R002 Palpitations: Secondary | ICD-10-CM

## 2019-06-04 MED ORDER — PANTOPRAZOLE SODIUM 20 MG PO TBEC: 20.0000 mg | DELAYED_RELEASE_TABLET | Freq: Every day | ORAL | 0 refills | Status: DC

## 2019-06-04 NOTE — ED Provider Notes (Signed)
[facility]    [name]: [id number] Arrival date & time: 06/04/19  1339      History   Chief Complaint Chief Complaint  Patient presents with  . Palpitations    HPI Douglas [name] is a 38 y.o. male with a history of HIV-on HAART, hypertension-controlled comes to urgent care with complaints of palpitation when he woke up this morning.  Patient denies any precipitating event.  Palpitation stated for a while and resolved.  He denies any chest pain or chest pressure.  No dizziness, near syncope or syncopal episode.  Patient has a history of gastroesophageal reflux disease and sometimes he says that the palpitations disappear when he burps.  No vomiting or diarrhea   HPI  Past Medical History:  Diagnosis Date  . Family history of heart disease in male family member before age 355   . HIV infection (HCC) 2014  . Hypertension 2004  . Low HDL (under 40)   . Seasonal allergic rhinitis    weather changes, spring/fall  . Sickle cell trait [facility](HCC)     Patient Active Problem List   Diagnosis Date Noted  . Family history of premature CAD 04/14/2019  . Atypical chest pain 09/18/2018  . Obesity 01/01/2018  . Screening examination for venereal disease 07/27/2014  . Screening for condition 07/27/2014  . Seborrheic dermatitis 08/19/2013  . HTN (hypertension) 06/25/2013  . Human immunodeficiency virus (HIV) disease (HCC) 06/25/2013    Past Surgical History:  Procedure Laterality Date  . NO PAST SURGERIES  03/2019       Home Medications    Prior to Admission medications   Medication Sig Start Date End Date Taking? Authorizing Provider  bictegravir-emtricitabine-tenofovir AF (BIKTARVY) 50-200-25 MG TABS tablet Take 1 tablet by mouth daily. 02/08/19   Cliffton Astersampbell, John, MD  hydrochlorothiazide (HYDRODIURIL) 25 MG tablet TAKE 1 TABLET(25 MG) BY MOUTH TWICE DAILY 02/12/19   Garnette Gunnerhompson, Aaron B, MD  losartan (COZAAR) 100 MG tablet Take 1 tablet (100 mg total) by mouth daily. 02/12/19    Garnette Gunnerhompson, Aaron B, MD  rosuvastatin (CRESTOR) 10 MG tablet Take 1 tablet (10 mg total) by mouth at bedtime. Patient not taking: Reported on 05/18/2019 04/15/19 04/14/20  Tysinger, Kermit Baloavid S, PA-C    Family History Family History  Problem Relation Age of Onset  . Hypertension Mother   . Stroke Mother   . Heart disease Father 4050       died of MI  . Diabetes Father   . Hypertension Sister   . Diabetes Sister        type 2  . Heart disease Paternal Uncle        several uncles died in 3150s with MI  . Hypertension Paternal Uncle   . Hypertension Maternal Grandmother   . Stroke Maternal Grandmother   . Hypertension Maternal Grandfather   . Cancer Paternal Grandmother        lung  . Heart disease Paternal Uncle        MI in 7050s    Social History Social History   Tobacco Use  . Smoking status: Former Smoker    Packs/day: 0.50    Start date: 09/03/2013    Quit date: 11/14/2017    Years since quitting: 1.5  . Smokeless tobacco: Never Used  . Tobacco comment: stress at his job  Substance Use Topics  . Alcohol use: Yes    Alcohol/week: 15.0 standard drinks    Types: 15 Glasses of wine per week  . Drug  use: No     Allergies   Shellfish allergy   Review of Systems Review of Systems  Constitutional: Negative for activity change, appetite change and chills.  HENT: Negative.   Respiratory: Negative.   Cardiovascular: Positive for palpitations. Negative for chest pain.  Gastrointestinal: Negative for abdominal pain, diarrhea and nausea.  Genitourinary: Negative for dysuria, frequency and urgency.  Musculoskeletal: Negative.   Neurological: Negative.  Negative for dizziness.     Physical Exam Triage Vital Signs ED Triage Vitals  Enc Vitals Group     BP 06/04/19 1412 (!) 150/94     Pulse Rate 06/04/19 1412 98     Resp 06/04/19 1412 17     Temp 06/04/19 1412 98.6 F (37 C)     Temp Source 06/04/19 1412 Oral     SpO2 06/04/19 1412 100 %     Weight --      Height  06/04/19 1411 6' (1.829 m)     Head Circumference --      Peak Flow --      Pain Score 06/04/19 1411 0     Pain Loc --      Pain Edu? --      Excl. in [place]? --    No data found.  Updated Vital Signs BP (!) 150/94 (BP Location: Left Arm)   Pulse 98   Temp 98.6 F (37 C) (Oral)   Resp 17   Ht 6' (1.829 m)   SpO2 100%   BMI 36.32 kg/m   Visual Acuity Right Eye Distance:   Left Eye Distance:   Bilateral Distance:    Right Eye Near:   Left Eye Near:    Bilateral Near:     Physical Exam Constitutional:      General: He is not in acute distress.    Appearance: Normal appearance. He is not ill-appearing or toxic-appearing.  HENT:     Mouth/Throat:     Mouth: Mucous membranes are moist.  Cardiovascular:     Rate and Rhythm: Normal rate and regular rhythm.     Pulses: Normal pulses.     Heart sounds: Normal heart sounds. No murmur. No friction rub. No gallop.   Pulmonary:     Effort: Pulmonary effort is normal. No respiratory distress.     Breath sounds: Normal breath sounds. No wheezing.  Abdominal:     General: Bowel sounds are normal. There is no distension.     Palpations: Abdomen is soft.     Tenderness: There is no abdominal tenderness. There is no guarding.  Musculoskeletal: Normal range of motion.        General: No swelling or deformity.  Skin:    General: Skin is warm.     Capillary Refill: Capillary refill takes less than 2 seconds.  Neurological:     General: No focal deficit present.     Mental Status: He is alert and oriented to person, place, and time.      UC Treatments / Results  Labs (all labs ordered are listed, but only abnormal results are displayed) Labs Reviewed - No data to display  EKG   Radiology No results found.  Procedures Procedures (including critical care time)  Medications Ordered in UC Medications - No data to display  Initial Impression / Assessment and Plan / UC Course  I have reviewed the triage vital signs and the  nursing notes.  Pertinent labs & imaging results that were available during my care of the patient were  reviewed by me and considered in my medical decision making (see chart for details).     1.  Palpitations likely related to gastroesophageal reflux disease: EKG showed normal sinus rhythm Protonix 20 mg orally daily If patient's symptoms are persistent or symptomatic, he is advised to come to the urgent care/ED for further work-up and evaluation.  2.  History of HIV on HAART  3.  History of hypertension currently suboptimally controlled. Final Clinical Impressions(s) / UC Diagnoses   Final diagnoses:  None   Discharge Instructions   None    ED Prescriptions    None     Controlled Substance Prescriptions [place] Controlled Substance Registry consulted? No   Merrilee JanskyLamptey, Senetra Dillin O, MD 06/04/19 1505

## 2019-06-04 NOTE — ED Triage Notes (Signed)
Patient presents to Urgent Care with complaints of heart palpitations, described as a "flutter" since this morning. Patient reports he is not in any pain, endorses high levels of stress.

## 2019-06-07 ENCOUNTER — Encounter: Payer: Self-pay | Admitting: Medical

## 2019-06-07 ENCOUNTER — Telehealth: Payer: Self-pay | Admitting: Medical

## 2019-06-07 ENCOUNTER — Other Ambulatory Visit: Payer: Self-pay

## 2019-06-07 ENCOUNTER — Ambulatory Visit (INDEPENDENT_AMBULATORY_CARE_PROVIDER_SITE_OTHER): Payer: 59 | Admitting: Medical

## 2019-06-07 VITALS — BP 135/86 | HR 83 | Temp 97.9°F | Ht 72.0 in | Wt 254.0 lb

## 2019-06-07 DIAGNOSIS — Z8249 Family history of ischemic heart disease and other diseases of the circulatory system: Secondary | ICD-10-CM | POA: Diagnosis not present

## 2019-06-07 DIAGNOSIS — R002 Palpitations: Secondary | ICD-10-CM | POA: Insufficient documentation

## 2019-06-07 DIAGNOSIS — E6609 Other obesity due to excess calories: Secondary | ICD-10-CM

## 2019-06-07 DIAGNOSIS — I10 Essential (primary) hypertension: Secondary | ICD-10-CM

## 2019-06-07 DIAGNOSIS — B20 Human immunodeficiency virus [HIV] disease: Secondary | ICD-10-CM

## 2019-06-07 NOTE — Telephone Encounter (Signed)
Pt has scheduled a virtual visit. He has not been keeping readings.

## 2019-06-07 NOTE — Progress Notes (Signed)
This visit type was conducted due to national recommendations for restrictions regarding the COVID-19 Pandemic (e.g. social distancing) in an effort to limit this patient's exposure and mitigate transmission in our community.  Due to their co-morbid illnesses, this patient is at least at moderate risk for complications without adequate follow up.  This format is felt to be most appropriate for this patient at this time.    Documentation for virtual audio and video telecommunications through Zoom encounter:  The patient was located at home. The provider was located in the office. The patient did consent to this visit and is aware of possible charges through their insurance for this visit.  The other persons participating in this telemedicine service were none. Time spent on call was 15 minutes and in review of previous records >15 minutes total.  This virtual service is not related to other E/M service within previous 7 days.  Subjective: Chief Complaint  Patient presents with  . Consult    discuss possible anxiety    Virtual consult today for palpitations.  He was seen by urgent care 06/04/19 for palpitations.   He notes he had been having palpitations of his heart throughout the day for a few days and they persisted so he went to urgent care. EKG was done.   He was told there was no worrisome findings, but he wanted cardiology eval to make sure. He made himself an appt for 06/22/19 with Dr. Jodelle RedBridgette Christopher.     He denies caffeine use, drinks about a gallon of water daily, rarely drinks alcohol and no street drugs.   He can't rule out anxiety, as this could be a contributing factor.   The day he went to urgent care he was feeling heart flutter.  It was having all day long, so he went to urgent care.  He had been having some chest pain, thought it was gas related.  Was given pills to help with gas.    Today has had some palpitations but not as bad as the prior few days.   He was fearful  about the palpitations given father's health history and father's side of family's health history with heart disease.  Has had palpations in the past like this before but not persistent.     Drinks basically water for the past 6 months.  occasionally diet coke.    He is eating an atkins style low carb daily.   20 g of carbs lately.  No family history of thyroid disease, no recent weight changes or changes in sensation of temperature.     Non smoker now, quit 1 year ago  Works from home.  Past Medical History:  Diagnosis Date  . Family history of heart disease in male family member before age 38   . HIV infection (HCC) 2014  . Hypertension 2004  . Low HDL (under 40)   . Seasonal allergic rhinitis    weather changes, spring/fall  . Sickle cell trait (HCC)    Current Outpatient Medications on File Prior to Visit  Medication Sig Dispense Refill  . bictegravir-emtricitabine-tenofovir AF (BIKTARVY) 50-200-25 MG TABS tablet Take 1 tablet by mouth daily. 30 tablet 10  . EPINEPHrine 0.3 mg/0.3 mL IJ SOAJ injection Inject into the muscle.    . hydrochlorothiazide (HYDRODIURIL) 25 MG tablet TAKE 1 TABLET(25 MG) BY MOUTH TWICE DAILY 180 tablet 3  . losartan (COZAAR) 100 MG tablet Take 1 tablet (100 mg total) by mouth daily. 90 tablet 3  . pantoprazole (PROTONIX)  20 MG tablet Take 1 tablet (20 mg total) by mouth daily. 30 tablet 0  . [DISCONTINUED] rosuvastatin (CRESTOR) 10 MG tablet Take 1 tablet (10 mg total) by mouth at bedtime. (Patient not taking: Reported on 05/18/2019) 90 tablet 0   No current facility-administered medications on file prior to visit.    Family History  Problem Relation Age of Onset  . Hypertension Mother   . Stroke Mother   . Heart disease Father 73       died of MI  . Diabetes Father   . Hypertension Sister   . Diabetes Sister        type 2  . Heart disease Paternal Uncle        several uncles died in 50s with MI  . Hypertension Paternal Uncle   . Hypertension  Maternal Grandmother   . Stroke Maternal Grandmother   . Hypertension Maternal Grandfather   . Cancer Paternal Grandmother        lung  . Heart disease Paternal Uncle        MI in 109s     Objective: BP 135/86   Pulse 83   Temp 97.9 F (36.6 C)   Ht 6' (1.829 m)   Wt 254 lb (115.2 kg)   BMI 34.45 kg/m  Wt Readings from Last 3 Encounters:  06/07/19 254 lb (115.2 kg)  05/18/19 267 lb 12.8 oz (121.5 kg)  04/22/19 260 lb (117.9 kg)   Gen: wd, wn, nad Not examined otherwise given virtual consult    Assessment: Encounter Diagnoses  Name Primary?  . Palpitation Yes  . Essential hypertension   . Family history of premature CAD   . Human immunodeficiency virus (HIV) disease ([place])   . Class 1 obesity due to excess calories without serious comorbidity in adult, unspecified BMI      Plan: We discussed palpations,possible causes including anxiety, cardiac, other.   He has very limited caffeine intake.   I reviewed the 06/04/19 urgent care visit notes and EKG.    He has significant family history of heart disease, he has LDL higher than desired.  We discussed low cholesterol diet, c/t routine exercise, work on weight loss. He is about a year out from stopping tobacco!  He is compliant with BP medication.  Discussed some ways to deal with stress in the event this is anxiety related.   Return tomorrow for vital signs and labs to further eval palpitations, and will continue referral plan for cardiology for likely even monitoring.      Whittaker was seen today for consult.  Diagnoses and all orders for this visit:  Palpitation -     Basic metabolic panel; Future -     CBC; Future -     TSH; Future  Essential hypertension -     Basic metabolic panel; Future -     CBC; Future -     TSH; Future  Family history of premature CAD  Human immunodeficiency virus (HIV) disease ([place])  Class 1 obesity due to excess calories without serious comorbidity in adult, unspecified BMI

## 2019-06-07 NOTE — Telephone Encounter (Signed)
Call and see if he can do virtual consult today with me. I reviewed the urgent care visit.  He also had a stress tests just this past fall that was normal.   It may be worth reviewing this together before deciding if needs to see cardiologist or not.   His BP was elevated at the urgent care.  See if he has some home BP readings we can use for comparison.

## 2019-06-07 NOTE — Telephone Encounter (Signed)
Call to complete official referral to cardiology, Dr. Buford Dresser with [facility] for 06/22/19 appt date for palpitations.

## 2019-06-08 ENCOUNTER — Other Ambulatory Visit: Payer: 59

## 2019-06-08 DIAGNOSIS — I10 Essential (primary) hypertension: Secondary | ICD-10-CM

## 2019-06-08 DIAGNOSIS — R002 Palpitations: Secondary | ICD-10-CM

## 2019-06-09 LAB — BASIC METABOLIC PANEL
BUN/Creatinine Ratio: 18 (ref 9–20)
BUN: 16 mg/dL (ref 6–20)
CO2: 23 mmol/L (ref 20–29)
Calcium: 9.2 mg/dL (ref 8.7–10.2)
Chloride: 102 mmol/L (ref 96–106)
Creatinine, Ser: 0.9 mg/dL (ref 0.76–1.27)
GFR calc Af Amer: 126 mL/min/{1.73_m2} (ref >59)
GFR calc non Af Amer: 109 mL/min/{1.73_m2} (ref >59)
Glucose: 103 mg/dL — ABNORMAL HIGH (ref 65–99)
Potassium: 3.8 mmol/L (ref 3.5–5.2)
Sodium: 142 mmol/L (ref 134–144)

## 2019-06-09 LAB — CBC
Hematocrit: 44.9 % (ref 37.5–51.0)
Hemoglobin: 14.2 g/dL (ref 13.0–17.7)
MCH: 27.4 pg (ref 26.6–33.0)
MCHC: 31.6 g/dL (ref 31.5–35.7)
MCV: 87 fL (ref 79–97)
Platelets: 246 10*3/uL (ref 150–450)
RBC: 5.19 x10E6/uL (ref 4.14–5.80)
RDW: 13.6 % (ref 11.6–15.4)
WBC: 4.9 10*3/uL (ref 3.4–10.8)

## 2019-06-09 LAB — TSH: TSH: 1.26 u[IU]/mL (ref 0.450–4.500)

## 2019-06-14 ENCOUNTER — Ambulatory Visit: Payer: Self-pay | Admitting: Family Medicine

## 2019-06-14 ENCOUNTER — Encounter: Payer: Self-pay | Admitting: Medical

## 2019-06-14 ENCOUNTER — Other Ambulatory Visit: Payer: Self-pay

## 2019-06-14 ENCOUNTER — Ambulatory Visit (INDEPENDENT_AMBULATORY_CARE_PROVIDER_SITE_OTHER): Payer: 59 | Admitting: Medical

## 2019-06-14 VITALS — BP 120/80 | HR 70 | Temp 98.2°F | Resp 16 | Ht 72.0 in | Wt 261.0 lb

## 2019-06-14 DIAGNOSIS — E6609 Other obesity due to excess calories: Secondary | ICD-10-CM

## 2019-06-14 DIAGNOSIS — Z79899 Other long term (current) drug therapy: Secondary | ICD-10-CM

## 2019-06-14 DIAGNOSIS — B20 Human immunodeficiency virus [HIV] disease: Secondary | ICD-10-CM

## 2019-06-14 DIAGNOSIS — I10 Essential (primary) hypertension: Secondary | ICD-10-CM

## 2019-06-14 DIAGNOSIS — Z8249 Family history of ischemic heart disease and other diseases of the circulatory system: Secondary | ICD-10-CM

## 2019-06-14 MED ORDER — LOSARTAN POTASSIUM-HCTZ 100-25 MG PO TABS: 1.0000 | ORAL_TABLET | Freq: Every day | ORAL | 0 refills | Status: DC

## 2019-06-14 NOTE — Patient Instructions (Signed)
Exercise: I recommend exercising most days of the week using a type of exercise that you would enjoy and stick to such as walking, running, swimming, hiking, biking, aerobics, etc. This needs to be at least 30-40 minutes at a time, at least 5 days/week with moderate intensity.  This would be 60-70% of your maximum heart rate.  For example, your maximal heart rate would be 200- your age, multiplied x 0.6 to equal 60% of your maximal heart rate.    Thus, a person age 40 would have a maximum heart rate of 160.  60% of this would be 96 beats per minute.  Thus for fat burning exercise, a 40-year-old would want to exercise has sustained heart rate of about 96 bpm for fat burning.  However for heart health, they would want to exercise at a higher heart rate of about 75% of maximum heart rate which would be a heart rate of about 120 beats per minute.  So I recommend a combination of doing some exercise at moderate intensity, and some exercise at vigorous intensity for heart health.   Low Carb Diet recommendations:  I recommend you drink water throughout the day.   70 ounces or 2 liters would be a good amount.  If you have been accustomed to drinking juice or soda, try water with lemon or water with lime, or try using no calorie flavor dropper.  I recommend the following as an example meal plan that includes 3 meals per day.   You can skip some meals periodically for intermittent fasting.  Breakfast (choose one): . Omelette with egg, can include a little bit of cheese, your choice of mushrooms, peppers, onions, salsa . Smoothie with handful of spinach or kale, 1 cup of milk or water, 1 cup of berries, 1 packet of artificial sweetener such as stevia . Yogurt with fruit   Mid morning snack: . 1 fruit serving and 1 protein such as 8 almonds or 8 nuts, or vegetable such as carrots and humus or other similar vegetable   Lunch: . Salad with 3-4 ounces of lean grilled or baked meat such as fish, chicken or  turkey   Mid afternoon snack: . 1 fruit serving and 1 protein such as 8 almonds or 8 nuts, or vegetable such as carrots and humus or other similar vegetable   Dinner: . Large serving of vegetables and 3-4 ounces of lean grilled or baked meat such as fish, chicken or turkey . Or vegetarian dish without meat    Avoid  . Chips, cookies, cake, donuts, soda, sweet tea, juices, candy, fast food . For now , avoid, or significantly limit grains  

## 2019-06-14 NOTE — Progress Notes (Signed)
Subjective: Chief Complaint  Patient presents with  . follow up    follow up chol  had coffee with creamer and sweetnlow    Here for f/u/med check.  We did a virtual consult a few weeks ago for palpitations.   He has been working on diet changes.  He has family history of heart disease in uncle and father. He is also on medication that increases risk of lipids.  He recently started working on improving lipids.  Drinking mostly water, has cut out sugary drinks and high sugar foods.  Using olive oil, eating lots of fruits and vegetables.  Eating a lot of salads for lunch.  He is still having some palpitations since the last visit but not like he was when he called in that day.  He still has his cardiology visit planned for June 22, 2019   Past Medical History:  Diagnosis Date  . Family history of heart disease in male family member before age 38   . HIV infection (HCC) 2014  . Hypertension 2004  . Low HDL (under 40)   . Seasonal allergic rhinitis    weather changes, spring/fall  . Sickle cell trait (HCC)     Family History  Problem Relation Age of Onset  . Hypertension Mother   . Stroke Mother   . Heart disease Father 3550       died of MI  . Diabetes Father   . Hypertension Sister   . Diabetes Sister        type 2  . Heart disease Paternal Uncle        several uncles died in 8050s with MI  . Hypertension Paternal Uncle   . Hypertension Maternal Grandmother   . Stroke Maternal Grandmother   . Hypertension Maternal Grandfather   . Cancer Paternal Grandmother        lung  . Heart disease Paternal Uncle        MI in 4450s     Objective: BP 120/80   Pulse 70   Temp 98.2 F (36.8 C) (Oral)   Resp 16   Ht 6' (1.829 m)   Wt 261 lb (118.4 kg)   SpO2 98%   BMI 35.40 kg/m   Wt Readings from Last 3 Encounters:  06/14/19 261 lb (118.4 kg)  06/07/19 254 lb (115.2 kg)  05/18/19 267 lb 12.8 oz (121.5 kg)    General appearance: alert, no distress, WD/WN,  Neck: supple, no  lymphadenopathy, no thyromegaly, no masses, no bruits Heart: RRR, normal S1, S2, no murmurs Lungs: CTA bilaterally, no wheezes, rhonchi, or rales Pulses: 2+ symmetric, upper and lower extremities, normal cap refill Ext: no edema   Assessment: Encounter Diagnoses  Name Primary?  . Essential hypertension Yes  . Family history of premature CAD   . Human immunodeficiency virus (HIV) disease (HCC)   . Class 1 obesity due to excess calories without serious comorbidity in adult, unspecified BMI   . High risk medication use      Plan: Fasting lipid panel today.  Counseled on diet, exercise, we discussed modifiable risk factors such as diet and exercise.  Continue medication as usual with change to combo losartan HCT instead of the separate components.  Of note he had been on HCTZ 25 twice a day but will change to once a day as he probably does not need to be on the double dosing.  Gave handout on low-carb diet.  We discussed the side effects of his HIV medicine  which can lead to elevated lipids we discussed his nonmodifiable risk factors including his family history of heart disease in both father and uncle.  Heriberto was seen today for follow up.  Diagnoses and all orders for this visit:  Essential hypertension -     Lipid panel  Family history of premature CAD -     Lipid panel  Human immunodeficiency virus (HIV) disease ([place]) -     Lipid panel  Class 1 obesity due to excess calories without serious comorbidity in adult, unspecified BMI -     Lipid panel  High risk medication use -     Lipid panel  Other orders -     losartan-hydrochlorothiazide (HYZAAR) 100-25 MG tablet; Take 1 tablet by mouth daily.

## 2019-06-15 LAB — LIPID PANEL
Chol/HDL Ratio: 3.9 ratio (ref 0.0–5.0)
Cholesterol, Total: 187 mg/dL (ref 100–199)
HDL: 48 mg/dL (ref >39)
LDL Calculated: 128 mg/dL — ABNORMAL HIGH (ref 0–99)
Triglycerides: 54 mg/dL (ref 0–149)
VLDL Cholesterol Cal: 11 mg/dL (ref 5–40)

## 2019-06-22 ENCOUNTER — Telehealth: Payer: Self-pay | Admitting: Cardiology

## 2019-06-22 ENCOUNTER — Other Ambulatory Visit: Payer: Self-pay

## 2019-06-22 ENCOUNTER — Ambulatory Visit: Payer: 59 | Admitting: Cardiology

## 2019-06-22 ENCOUNTER — Encounter: Payer: Self-pay | Admitting: Cardiology

## 2019-06-22 VITALS — BP 138/82 | HR 77 | Temp 97.6°F | Ht 72.0 in | Wt 264.0 lb

## 2019-06-22 DIAGNOSIS — Z79899 Other long term (current) drug therapy: Secondary | ICD-10-CM

## 2019-06-22 DIAGNOSIS — Z7189 Other specified counseling: Secondary | ICD-10-CM

## 2019-06-22 DIAGNOSIS — R072 Precordial pain: Secondary | ICD-10-CM | POA: Diagnosis not present

## 2019-06-22 DIAGNOSIS — R002 Palpitations: Secondary | ICD-10-CM

## 2019-06-22 DIAGNOSIS — I10 Essential (primary) hypertension: Secondary | ICD-10-CM

## 2019-06-22 DIAGNOSIS — E7800 Pure hypercholesterolemia, unspecified: Secondary | ICD-10-CM

## 2019-06-22 DIAGNOSIS — Z8249 Family history of ischemic heart disease and other diseases of the circulatory system: Secondary | ICD-10-CM

## 2019-06-22 NOTE — Telephone Encounter (Signed)

## 2019-06-22 NOTE — Patient Instructions (Signed)
Medication Instructions:  Your Physician recommend you continue on your current medication as directed.    If you need a refill on your cardiac medications before your next appointment, please call your pharmacy.   Lab work: Your physician recommends that you return for lab work in 6 months (fasting lipids)  If you have labs (blood work) drawn today and your tests are completely normal, you will receive your results only by: Marland Kitchen MyChart Message (if you have MyChart) OR . A paper copy in the mail If you have any lab test that is abnormal or we need to change your treatment, we will call you to review the results.  Testing/Procedures: None  Follow-Up: At [facility], you and your health needs are our priority.  As part of our continuing mission to provide you with exceptional heart care, we have created designated Provider Care Teams.  These Care Teams include your primary Cardiologist (physician) and Advanced Practice Providers (APPs -  Physician Assistants and Nurse Practitioners) who all work together to provide you with the care you need, when you need it. You will need a follow up appointment in 6 months.  Please call our office 2 months in advance to schedule this appointment.  You may see Dr. Harrell Gave or one of the following Advanced Practice Providers on your designated Care Team:   Rosaria Ferries, PA-C . Jory Sims, DNP, ANP

## 2019-06-22 NOTE — Progress Notes (Signed)
Cardiology Office Note:    Date:  06/22/2019   ID:  Douglas [name] Fandino, DOB [birth year], MRN [id number]  PCP:  Jac Canavanysinger, David S, PA-C  Cardiologist:  Jodelle RedBridgette Zen Felling, MD PhD  Referring MD: Jac Canavanysinger, David S, PA-C   CC: new consult for palpitations, chest pain, CV risk factors  History of Present Illness:    Douglas [name] Weisel is a 38 y.o. male with a hx of HIV, hypertension who is seen as a new consult at the request of Jac Canavanysinger, David S, PA-C for the evaluation and management of palpitations, chest pain, CV risk assessment.  Patient concerns today: Has a strong FH on his father's side of heart disease. Wants to make sure heart is good and strong, have a good prevention plan.  Tachycardia/palpitations: Initially noted 2 weeks ago when he woke up in the AM. Has had in the past, but this time lasted all day long on and off. Went to urgent care, was told all was fine. Still occurring even after he went to urgent care. Has not noticed as frequently recently. No clear triggers, not sure if it is stress related or otherwise.   Chest pain: Has had issues with chest pain since childhood. Has occasional mid epigastric pain, sometimes related to stretching, sometimes better with belching. Had treadmill test 09/2018 which was normal. Hasn't had issues since starting anti gas pain. Pain is dull, central, nonradiating, no associated symptoms. Lasts varying amounts of time, both short and long term.  Hypertension: BP 149/92 before medicine. Doesn't check routinely at home. No issues with medications.  HLD: hasn't started statin yet, wants to work on weight loss first  Cardiovascular risk factors: Prior clinical ASCVD:  none Comorbid conditions: hypertension, hyperlipidemia (not yet on statin for this) Metabolic syndrome/Obesity: BMI 35.8 Chronic inflammatory conditions:well controlled HIV Tobacco use history:quit 10/2018, smoked on and off for about 10 years Family history: father and two brothers  all died of MI at age 38. Didn't have much medical care prior to it.  Prior cardiac testing and/or incidental findings on other testing (ie coronary calcium): ETT normal Exercise level: trying to get active every day Current diet: had tried to do low carb, but just joined weight watchers for weight loss  Denies shortness of breath at rest or with normal exertion. No PND, orthopnea, LE edema. No syncope.  Past Medical History:  Diagnosis Date  . Family history of heart disease in male family member before age 38   . HIV infection (HCC) 2014  . Hypertension 2004  . Low HDL (under 40)   . Seasonal allergic rhinitis    weather changes, spring/fall  . Sickle cell trait [facility](HCC)     Past Surgical History:  Procedure Laterality Date  . NO PAST SURGERIES  03/2019    Current Medications: Current Outpatient Medications on File Prior to Visit  Medication Sig  . bictegravir-emtricitabine-tenofovir AF (BIKTARVY) 50-200-25 MG TABS tablet Take 1 tablet by mouth daily.  Marland Kitchen. EPINEPHrine 0.3 mg/0.3 mL IJ SOAJ injection Inject into the muscle.  . losartan-hydrochlorothiazide (HYZAAR) 100-25 MG tablet Take 1 tablet by mouth daily.  . pantoprazole (PROTONIX) 20 MG tablet Take 1 tablet (20 mg total) by mouth daily.  . [DISCONTINUED] rosuvastatin (CRESTOR) 10 MG tablet Take 1 tablet (10 mg total) by mouth at bedtime. (Patient not taking: Reported on 05/18/2019)   No current facility-administered medications on file prior to visit.      Allergies:   Shellfish allergy   Social History   Socioeconomic  History  . Marital status: Significant Other    Spouse name: Not on file  . Number of children: Not on file  . Years of education: Not on file  . Highest education level: Not on file  Occupational History  . Not on file  Social Needs  . Financial resource strain: Not on file  . Food insecurity    Worry: Not on file    Inability: Not on file  . Transportation needs    Medical: Not on file     Non-medical: Not on file  Tobacco Use  . Smoking status: Former Smoker    Packs/day: 0.50    Start date: 09/03/2013    Quit date: 11/14/2017    Years since quitting: 1.6  . Smokeless tobacco: Never Used  . Tobacco comment: stress at his job  Substance and Sexual Activity  . Alcohol use: Yes    Alcohol/week: 15.0 standard drinks    Types: 15 Glasses of wine per week  . Drug use: No  . Sexual activity: Yes    Partners: Male    Birth control/protection: Condom  Lifestyle  . Physical activity    Days per week: Not on file    Minutes per session: Not on file  . Stress: Not on file  Relationships  . Social Musicianconnections    Talks on phone: Not on file    Gets together: Not on file    Attends religious service: Not on file    Active member of club or organization: Not on file    Attends meetings of clubs or organizations: Not on file    Relationship status: Not on file  Other Topics Concern  . Not on file  Social History Narrative   Psychologist, counsellingCoding and resource specialist, assist nursing with re-certifications, [employer].   Exercise - video programs.  Weight bearing exercise as well  03/2019     Family History: The patient's family history includes Cancer in his paternal grandmother; Diabetes in his father and sister; Heart disease in his paternal uncle and paternal uncle; Heart disease (age of onset: 9050) in his father; Hypertension in his maternal grandfather, maternal grandmother, mother, paternal uncle, and sister; Stroke in his maternal grandmother and mother.  ROS:   Please see the history of present illness.  Additional pertinent ROS:  Constitutional: Negative for chills, fever, night sweats, unintentional weight loss  HENT: Negative for ear pain and hearing loss.   Eyes: Negative for loss of vision and eye pain.  Respiratory: Negative for cough, sputum, shortness of breath, wheezing.   Cardiovascular: See HPI. Gastrointestinal: Negative for abdominal pain, melena, and  hematochezia.  Genitourinary: Negative for dysuria and hematuria.  Musculoskeletal: Negative for falls and myalgias.  Skin: Negative for itching and rash.  Neurological: Negative for focal weakness, focal sensory changes and loss of consciousness.  Endo/Heme/Allergies: Does not bruise/bleed easily.    EKGs/Labs/Other Studies Reviewed:    The following studies were reviewed today: ETT 09/2018  EKG:  EKG is personally reviewed.  The ekg ordered 06/04/19 demonstrates NSR. Borderline LVH/RAE given age  Recent Labs: 12/16/2018: ALT 29 06/08/2019: BUN 16; Creatinine, Ser 0.90; Hemoglobin 14.2; Platelets 246; Potassium 3.8; Sodium 142; TSH 1.260  Recent Lipid Panel    Component Value Date/Time   CHOL 187 06/14/2019 1145   TRIG 54 06/14/2019 1145   HDL 48 06/14/2019 1145   CHOLHDL 3.9 06/14/2019 1145   CHOLHDL 4.1 12/16/2018 0856   VLDL 14 01/02/2017 1359   LDLCALC 128 (  H) 06/14/2019 1145   LDLCALC 128 (H) 12/16/2018 0856    Physical Exam:    VS:  BP 138/82   Pulse 77   Temp 97.6 F (36.4 C)   Ht 6' (1.829 m)   Wt 264 lb (119.7 kg)   SpO2 97%   BMI 35.80 kg/m     Wt Readings from Last 3 Encounters:  06/22/19 264 lb (119.7 kg)  06/14/19 261 lb (118.4 kg)  06/07/19 254 lb (115.2 kg)     GEN: Well nourished, well developed in no acute distress HEENT: Normal NECK: No JVD; No carotid bruits LYMPHATICS: No lymphadenopathy CARDIAC: regular rhythm, normal S1 and S2, no murmurs, rubs, gallops. Radial and DP pulses 2+ bilaterally. RESPIRATORY:  Clear to auscultation without rales, wheezing or rhonchi  ABDOMEN: Soft, non-tender, non-distended MUSCULOSKELETAL:  No edema; No deformity  SKIN: Warm and dry NEUROLOGIC:  Alert and oriented x 3 PSYCHIATRIC:  Normal affect   ASSESSMENT:    1. Cardiac risk counseling   2. Medication management   3. Precordial pain   4. Heart palpitations   5. Family history of heart disease   6. Essential hypertension   7. Counseling on health  promotion and disease prevention    PLAN:    History of palpitations, chest pain: no recent events. Chest pain improved with anti-gas medications. -normal ETT 2019 -no additional testing at this time, though if symptoms recur would consider monitor and coronary CTA  Cardiac risk counseling: -has strong family history on his father's side, though not technically premature -Personal risk factors:  Hypertension: above goal today, but working on weight loss. If still elevated at follow up, adjust medications  Hypercholesterolemia: LDL 128. With his risk factors, would like closer to 100. Has been ordered a statin but not yet taking. We agreed he will work on lifestyle and weight loss. If still elevated at follow up, discuss starting statin  HIV: discussed that this is associated with CAD independently of other factors  Obesity: BMI 35.8. Working on weight loss   Pprevention recommendations: -recommend heart healthy/Mediterranean diet, with whole grains, fruits, vegetable, fish, lean meats, nuts, and olive oil. Limit salt. -recommend moderate walking, 3-5 times/week for 30-50 minutes each session. Aim for at least 150 minutes.week. Goal should be pace of 3 miles/hours, or walking 1.5 miles in 30 minutes -recommend avoidance of tobacco products. Avoid excess alcohol. -Additional risk factor control:  -Diabetes: A1c is 5.7, no history  Plan for follow up: 6 mos, with fasting lipids prior  Medication Adjustments/Labs and Tests Ordered: Current medicines are reviewed at length with the patient today.  Concerns regarding medicines are outlined above.  Orders Placed This Encounter  Procedures  . Lipid panel   No orders of the defined types were placed in this encounter.   Patient Instructions  Medication Instructions:  Your Physician recommend you continue on your current medication as directed.    If you need a refill on your cardiac medications before your next appointment, please call  your pharmacy.   Lab work: Your physician recommends that you return for lab work in 6 months (fasting lipids)  If you have labs (blood work) drawn today and your tests are completely normal, you will receive your results only by: Marland Kitchen MyChart Message (if you have MyChart) OR . A paper copy in the mail If you have any lab test that is abnormal or we need to change your treatment, we will call you to review the results.  Testing/Procedures: None  Follow-Up: [facility], you and your health needs are our priority.  As part of our continuing mission to provide you with exceptional heart care, we have created designated Provider Care Teams.  These Care Teams include your primary Cardiologist (physician) and Advanced Practice Providers (APPs -  Physician Assistants and Nurse Practitioners) who all work together to provide you with the care you need, when you need it. You will need a follow up appointment in 6 months.  Please call our office 2 months in advance to schedule this appointment.  You may see Dr. Cristal Deerhristopher or one of the following Advanced Practice Providers on your designated Care Team:   Theodore DemarkRhonda Barrett, PA-C . Joni ReiningKathryn Lawrence, DNP, ANP        Signed, Jodelle RedBridgette Xyla Leisner, MD PhD 06/22/2019 6:49 PM    [facility]

## 2019-07-11 ENCOUNTER — Other Ambulatory Visit: Payer: Self-pay | Admitting: Medical

## 2019-07-12 ENCOUNTER — Telehealth: Payer: Self-pay | Admitting: Medical

## 2019-07-12 ENCOUNTER — Other Ambulatory Visit: Payer: Self-pay

## 2019-07-12 DIAGNOSIS — I10 Essential (primary) hypertension: Secondary | ICD-10-CM

## 2019-07-12 MED ORDER — LOSARTAN POTASSIUM-HCTZ 100-25 MG PO TABS: 1.0000 | ORAL_TABLET | Freq: Every day | ORAL | 0 refills | Status: DC

## 2019-07-12 NOTE — Telephone Encounter (Signed)
Done

## 2019-07-12 NOTE — Telephone Encounter (Signed)
Fax refill on losartan/HCTZ 100/25 90 day supply

## 2019-07-13 ENCOUNTER — Other Ambulatory Visit: Payer: Self-pay | Admitting: Medical

## 2019-07-13 DIAGNOSIS — I10 Essential (primary) hypertension: Secondary | ICD-10-CM

## 2019-07-27 ENCOUNTER — Other Ambulatory Visit: Payer: Self-pay

## 2019-07-27 ENCOUNTER — Other Ambulatory Visit (INDEPENDENT_AMBULATORY_CARE_PROVIDER_SITE_OTHER): Payer: 59

## 2019-07-27 DIAGNOSIS — Z23 Encounter for immunization: Secondary | ICD-10-CM

## 2019-09-09 ENCOUNTER — Ambulatory Visit: Payer: 59 | Admitting: Medical

## 2019-09-09 ENCOUNTER — Other Ambulatory Visit: Payer: Self-pay

## 2019-09-09 ENCOUNTER — Encounter: Payer: Self-pay | Admitting: Medical

## 2019-09-09 VITALS — BP 120/72 | HR 76 | Temp 96.2°F | Ht 72.0 in | Wt 262.8 lb

## 2019-09-09 DIAGNOSIS — H6690 Otitis media, unspecified, unspecified ear: Secondary | ICD-10-CM | POA: Diagnosis not present

## 2019-09-09 DIAGNOSIS — M542 Cervicalgia: Secondary | ICD-10-CM | POA: Diagnosis not present

## 2019-09-09 DIAGNOSIS — H9201 Otalgia, right ear: Secondary | ICD-10-CM

## 2019-09-09 NOTE — Progress Notes (Signed)
Subjective: Chief Complaint  Patient presents with  . Neck Pain    when swallowing  . Ear Pain   Here for possible ear infection.  A few days ago he felt some discomfort in his right neck, thought he had a crick in the neck but can move his neck just fine.  Then yesterday had ear pain on the right.  He did a tele visit, and was given amoxicillin for possible ear infection.  He has taken 1 dose of this.  He notes pain focalized in the right ear.  No ear drainage, no sore throat no sinus pressure no fever no cough no other respiratory symptoms.  No recent swimming.  No ear drainage.  No enlarged lymph nodes.  No trauma to the neck.  No teeth pain.  Otherwise normal state of health.  No other aggravating or relieving factors. No other complaint.   Past Medical History:  Diagnosis Date  . Family history of heart disease in male family member before age 54   . HIV infection ([place]) 2014  . Hypertension 2004  . Low HDL (under 40)   . Seasonal allergic rhinitis    weather changes, spring/fall  . Sickle cell trait ([place])    Current Outpatient Medications on File Prior to Visit  Medication Sig Dispense Refill  . bictegravir-emtricitabine-tenofovir AF (BIKTARVY) 50-200-25 MG TABS tablet Take 1 tablet by mouth daily. 30 tablet 10  . EPINEPHrine 0.3 mg/0.3 mL IJ SOAJ injection Inject into the muscle.    . losartan-hydrochlorothiazide (HYZAAR) 100-25 MG tablet Take 1 tablet by mouth daily. 90 tablet 0  . pantoprazole (PROTONIX) 20 MG tablet Take 1 tablet (20 mg total) by mouth daily. 30 tablet 0  . [DISCONTINUED] rosuvastatin (CRESTOR) 10 MG tablet Take 1 tablet (10 mg total) by mouth at bedtime. (Patient not taking: Reported on 05/18/2019) 90 tablet 0   No current facility-administered medications on file prior to visit.    ROS as in subjective    Objective: BP 120/72   Pulse 76   Temp (!) 96.2 F (35.7 C)   Ht 6' (1.829 m)   Wt 262 lb 12.8 oz (119.2 kg)   SpO2 98%   BMI 35.64 kg/m    Gen: wd, wn, nad Skin unremarkable no redness no swelling of the face or auricular area He was tender on ear speculum exam on the right, there is flattening of the light reflex, and he seems to be tender on the anterior portion of the ear canal although no obvious redness or abscess or rash noted Rest of HEENT unremarkable Oral: Moderate plaque throughout, but no obvious abscess or other tooth or pharynx issue Neck: Supple, nontender, no thyromegaly no lymphadenopathy normal range of motion Arms normal range of motion, nontender Upper back nontender   Assessment Encounter Diagnoses  Name Primary?  . Otalgia of right ear Yes  . Ear infection   . Neck discomfort      Plan: His symptoms and exam would suggest either early otitis media or possible skin infection within the ear canal.  I advised to go ahead and complete the course of amoxicillin prescribed by tele visit elsewhere yesterday, rest, hydrate well, can use over-the-counter analgesics.  F/u prn   Mosiah was seen today for neck pain and ear pain.  Diagnoses and all orders for this visit:  Otalgia of right ear  Ear infection  Neck discomfort

## 2019-11-12 ENCOUNTER — Other Ambulatory Visit: Payer: Self-pay | Admitting: Medical

## 2019-11-12 DIAGNOSIS — I10 Essential (primary) hypertension: Secondary | ICD-10-CM

## 2019-12-10 ENCOUNTER — Other Ambulatory Visit: Payer: Self-pay | Admitting: *Deleted

## 2019-12-10 DIAGNOSIS — B20 Human immunodeficiency virus [HIV] disease: Secondary | ICD-10-CM

## 2019-12-10 MED ORDER — BICTEGRAVIR-EMTRICITAB-TENOFOV 50-200-25 MG PO TABS: 1.0000 | ORAL_TABLET | Freq: Every day | ORAL | 5 refills | Status: DC

## 2019-12-10 NOTE — Progress Notes (Signed)
Received faxed request. Andree Coss, RN

## 2019-12-16 ENCOUNTER — Other Ambulatory Visit: Payer: 59

## 2019-12-16 ENCOUNTER — Other Ambulatory Visit: Payer: Self-pay

## 2019-12-16 DIAGNOSIS — B20 Human immunodeficiency virus [HIV] disease: Secondary | ICD-10-CM

## 2019-12-17 LAB — T-HELPER CELL (CD4) - (RCID CLINIC ONLY)
CD4 % Helper T Cell: 31 % — ABNORMAL LOW (ref 33–65)
CD4 T Cell Abs: 507 /uL (ref 400–1790)

## 2019-12-21 ENCOUNTER — Telehealth: Payer: 59 | Admitting: Cardiology

## 2019-12-21 LAB — RPR: RPR Ser Ql: NONREACTIVE

## 2019-12-21 LAB — COMPREHENSIVE METABOLIC PANEL
AG Ratio: 1.7 (calc) (ref 1.0–2.5)
ALT: 43 U/L (ref 9–46)
AST: 23 U/L (ref 10–40)
Albumin: 4.6 g/dL (ref 3.6–5.1)
Alkaline phosphatase (APISO): 62 U/L (ref 36–130)
BUN: 18 mg/dL (ref 7–25)
CO2: 30 mmol/L (ref 20–32)
Calcium: 9.9 mg/dL (ref 8.6–10.3)
Chloride: 100 mmol/L (ref 98–110)
Creat: 1.09 mg/dL (ref 0.60–1.35)
Globulin: 2.7 g/dL (calc) (ref 1.9–3.7)
Glucose, Bld: 98 mg/dL (ref 65–99)
Potassium: 4 mmol/L (ref 3.5–5.3)
Sodium: 139 mmol/L (ref 135–146)
Total Bilirubin: 0.5 mg/dL (ref 0.2–1.2)
Total Protein: 7.3 g/dL (ref 6.1–8.1)

## 2019-12-21 LAB — LIPID PANEL
Cholesterol: 205 mg/dL — ABNORMAL HIGH (ref <200)
HDL: 45 mg/dL (ref >=40)
LDL Cholesterol (Calc): 135 mg/dL (calc) — ABNORMAL HIGH
Non-HDL Cholesterol (Calc): 160 mg/dL (calc) — ABNORMAL HIGH (ref <130)
Total CHOL/HDL Ratio: 4.6 (calc) (ref <5.0)
Triglycerides: 127 mg/dL (ref <150)

## 2019-12-21 LAB — CBC
HCT: 46.8 % (ref 38.5–50.0)
Hemoglobin: 15.6 g/dL (ref 13.2–17.1)
MCH: 28.5 pg (ref 27.0–33.0)
MCHC: 33.3 g/dL (ref 32.0–36.0)
MCV: 85.4 fL (ref 80.0–100.0)
MPV: 10.8 fL (ref 7.5–12.5)
Platelets: 272 10*3/uL (ref 140–400)
RBC: 5.48 10*6/uL (ref 4.20–5.80)
RDW: 13.4 % (ref 11.0–15.0)
WBC: 5.3 10*3/uL (ref 3.8–10.8)

## 2019-12-21 LAB — HIV-1 RNA QUANT-NO REFLEX-BLD
HIV 1 RNA Quant: <20 copies/mL
HIV-1 RNA Quant, Log: <1.3 Log copies/mL

## 2019-12-29 ENCOUNTER — Ambulatory Visit (INDEPENDENT_AMBULATORY_CARE_PROVIDER_SITE_OTHER): Payer: 59 | Admitting: Internal Medicine

## 2019-12-29 ENCOUNTER — Other Ambulatory Visit: Payer: Self-pay

## 2019-12-29 DIAGNOSIS — B20 Human immunodeficiency virus [HIV] disease: Secondary | ICD-10-CM | POA: Diagnosis not present

## 2019-12-29 MED ORDER — BICTEGRAVIR-EMTRICITAB-TENOFOV 50-200-25 MG PO TABS: 1.0000 | ORAL_TABLET | Freq: Every day | ORAL | 11 refills | Status: DC

## 2019-12-29 NOTE — Progress Notes (Signed)
Virtual Visit via Telephone Note  I connected with Douglas [name] on 12/29/19 at  9:00 AM EST by telephone and verified that I am speaking with the correct person using two identifiers.  Location: Patient: Home Provider: [facility]   I discussed the limitations, risks, security and privacy concerns of performing an evaluation and management service by telephone and the availability of in person appointments. I also discussed with the patient that there may be a patient responsible charge related to this service. The patient expressed understanding and agreed to proceed.   History of Present Illness: I called and spoke with Ladene Artist today.  He had no problems transitioning him to [place] after his last visit.  He has had no problems obtaining, taking or tolerating it and does not recall missing any doses.  He is feeling well.  He is working from home during the pandemic and helping care for his mother.  She got her first Covid vaccine yesterday and he plans on getting it as soon as he is eligible.  He is back smoking cigarettes intermittently.  He is getting exercise walking about 1 hour each day.   Observations/Objective: HIV 1 RNA Quant (copies/mL)  Date Value  12/16/2019 <20 NOT DETECTED  12/16/2018 <20 NOT DETECTED  12/11/2017 <20 DETECTED (A)   CD4 T Cell Abs (/uL)  Date Value  12/16/2019 507  12/16/2018 590  12/11/2017 550    Assessment and Plan: His infection remains under excellent, long-term control he will continue Biktarvy and follow-up after lab work in 1 year.  I encouraged him to continue to work on complete cigarette cessation.  Follow Up Instructions: Continue Biktarvy and follow-up after lab work in 1 year   I discussed the assessment and treatment plan with the patient. The patient was provided an opportunity to ask questions and all were answered. The patient agreed with the plan and demonstrated an understanding of the instructions.   The patient was advised to  call back or seek an in-person evaluation if the symptoms worsen or if the condition fails to improve as anticipated.  I provided 14 minutes of non-face-to-face time during this encounter.   [name], MD

## 2019-12-30 ENCOUNTER — Encounter: Payer: 59 | Admitting: Internal Medicine

## 2020-01-05 ENCOUNTER — Telehealth: Payer: Self-pay | Admitting: Cardiology

## 2020-01-05 NOTE — Telephone Encounter (Signed)
Called patient to schedule follow up visit with Dr. Cristal Deer, he stated he did not want to schedule appt, he feels he doesn't need to see her at this point in time.

## 2020-01-12 ENCOUNTER — Ambulatory Visit: Payer: 59 | Admitting: Medical

## 2020-01-17 ENCOUNTER — Ambulatory Visit: Payer: 59 | Admitting: Medical

## 2020-01-17 ENCOUNTER — Encounter: Payer: Self-pay | Admitting: Medical

## 2020-01-17 ENCOUNTER — Other Ambulatory Visit: Payer: Self-pay

## 2020-01-17 VITALS — BP 140/84 | HR 85 | Temp 97.8°F | Ht 72.0 in | Wt 263.2 lb

## 2020-01-17 DIAGNOSIS — M67449 Ganglion, unspecified hand: Secondary | ICD-10-CM | POA: Diagnosis not present

## 2020-01-17 DIAGNOSIS — M25512 Pain in left shoulder: Secondary | ICD-10-CM

## 2020-01-17 DIAGNOSIS — M549 Dorsalgia, unspecified: Secondary | ICD-10-CM

## 2020-01-17 DIAGNOSIS — M25511 Pain in right shoulder: Secondary | ICD-10-CM

## 2020-01-17 NOTE — Progress Notes (Signed)
Subjective: Chief Complaint  Patient presents with  . Spasms    neck to knuckles   He notes some recent pains.  Not sure if muscle strain, posture issues, new work out routine.  Started weight lifting program for 5 days, but got these pains so stopped temporarily.  He had only gotten into this routine within the past month.  Been having some muscle spasm, sometimes goes down left arm.  No neck pain.   Has lump on left ring finger that appeared recently.  Sometimes it hurts, most of the time doesn't hurt.  Saw this after first lifting weights a month ago.  No recent injury, trauma.  Drinks some alcohol, maybe 3 shots every 3 days.   Water intake is a gallon per day.  No recent fall, trauma or injury.    Past Medical History:  Diagnosis Date  . Family history of heart disease in male family member before age 60   . HIV infection (HCC) 2014  . Hypertension 2004  . Low HDL (under 40)   . Seasonal allergic rhinitis    weather changes, spring/fall  . Sickle cell trait [facility])    Past Surgical History:  Procedure Laterality Date  . NO PAST SURGERIES  03/2019   ROS as in subjective   Objective: BP 140/84   Pulse 85   Temp 97.8 F (36.6 C)   Ht 6' (1.829 m)   Wt 263 lb 3.2 oz (119.4 kg)   SpO2 97%   BMI 35.70 kg/m   Gen; wd, wn ,nad Skin unremarkable Upper back nontender, otherwise back nontender, normal ROM Left 4th finger lateral surface of PIP with 1cm nodule, not obvious mobility but seems to be lateral and adjacent to the MCP joint, cant quite discern if mobile.   shoulders nontender, normal ROM in general. Negative special tests.   Arms neurovascularly intact Ext: no edema  Assessment: Encounter Diagnoses  Name Primary?  . Bilateral shoulder pain, unspecified chronicity Yes  . Upper back pain   . Digital mucinous cyst of finger     Plan: Discussed shoulder pain, back pain after recent initiation of new exercise plan that he started for 5 days.   likely has  musculoskeletal inflammation.  No obvious rotator cuff damage, bursitis or labral issues.   We discussed regular stretching routine. For acute flare up of pain can use ice therapy for 20 minutes each for a few days, then alternate ice/heat therapy at a time, BID  We discussed regular exercise, stretching, and for now gradually resume resistance training with resistance bands sold online and not heavier free weights for the next few weeks as gradually rehabilitatives shoulders with the exercises demonstrated using high reps with resistance bands.   Cyst of finger - likely benign.  We will use watch and wait approach.    Douglas [name] was seen today for spasms.  Diagnoses and all orders for this visit:  Bilateral shoulder pain, unspecified chronicity  Upper back pain  Digital mucinous cyst of finger  if not much improved in the next month, consider xrays of shoulders, finger.

## 2020-01-20 ENCOUNTER — Encounter (HOSPITAL_COMMUNITY): Payer: Self-pay

## 2020-01-20 ENCOUNTER — Other Ambulatory Visit: Payer: Self-pay

## 2020-01-20 ENCOUNTER — Other Ambulatory Visit: Payer: Self-pay | Admitting: Medical

## 2020-01-20 ENCOUNTER — Ambulatory Visit (HOSPITAL_COMMUNITY)
Admission: EM | Admit: 2020-01-20 | Discharge: 2020-01-20 | Disposition: A | Discharge status: Home / Self Care | Payer: 59 | Attending: Family Medicine | Admitting: Family Medicine

## 2020-01-20 DIAGNOSIS — I10 Essential (primary) hypertension: Secondary | ICD-10-CM

## 2020-01-20 DIAGNOSIS — R079 Chest pain, unspecified: Secondary | ICD-10-CM | POA: Diagnosis not present

## 2020-01-20 NOTE — ED Triage Notes (Addendum)
Pt states onset of mid-sternal CP this morning while dressing. Also states back pain to thoracic/left scapular area x1 month. Reports CP increases with coughing, movement and belching. Denies SOB, dizziness, diaphoresis or n/v.  States back pain is "different" today and radiating left arm with some numbness to hand which has been intermittent for approx 1 month. Pt reports he re-started a weight lifting physical exercise regimen approx 1 month ago.  Reports NO cp at present. Discomfort is intermittent in nature.  Pt states he accidentally threw away his HTN meds and did not take it this morning.

## 2020-01-20 NOTE — ED Provider Notes (Signed)
[facility]    [name]: [id number] Arrival date & time: 01/20/20  1041      History   Chief Complaint Chief Complaint  Patient presents with  . Chest Pain  . Back Pain    HPI Douglas [name] is a 39 y.o. male.   HPI  Patient states that he has been trying to do some weight lifting.  He is trying to increase his exercise.  He is also been walking on a daily basis.  He states that he has had some pain in his left shoulder blade left middle back region off and on for a month.  It is worse with certain lifting activities.  He thinks it is a muscular injury.  He has not taken any medications for that.  This morning he woke up with some midsternal chest pain.  He states that this also radiates to his back.  It is worse with coughing movement and belching.  No shortness of breath no radiation no dizziness no diaphoresis no nausea no vomiting.  He has no hyperlipidemia, heart disease, but does have hypertension.  Is out of his medication.  He is a cigarette smoker.  He is trying to quit.  He does have a family history of heart disease at a young age.  No exertional symptoms.  No chest pain with walking or activity. Patient does not currently have chest pain  Past Medical History:  Diagnosis Date  . Family history of heart disease in male family member before age 89   . HIV infection (HCC) 2014  . Hypertension 2004  . Low HDL (under 40)   . Seasonal allergic rhinitis    weather changes, spring/fall  . Sickle cell trait [facility])     Patient Active Problem List   Diagnosis Date Noted  . Family history of heart disease 06/22/2019  . Pure hypercholesterolemia 06/22/2019  . Heart palpitations 06/07/2019  . Family history of premature CAD 04/14/2019  . Atypical chest pain 09/18/2018  . Obesity 01/01/2018  . Screening examination for venereal disease 07/27/2014  . Screening for condition 07/27/2014  . Seborrheic dermatitis 08/19/2013  . Essential hypertension 06/25/2013  . Human  immunodeficiency virus (HIV) disease (HCC) 06/25/2013    Past Surgical History:  Procedure Laterality Date  . NO PAST SURGERIES  03/2019       Home Medications    Prior to Admission medications   Medication Sig Start Date End Date Taking? Authorizing Provider  bictegravir-emtricitabine-tenofovir AF (BIKTARVY) 50-200-25 MG TABS tablet Take 1 tablet by mouth daily. 12/29/19  Yes [name], MD  losartan-hydrochlorothiazide [facility]) 100-25 MG tablet TAKE 1 TABLET BY MOUTH DAILY 01/20/20   Tysinger, Kermit Balo, PA-C  rosuvastatin (CRESTOR) 10 MG tablet Take 1 tablet (10 mg total) by mouth at bedtime. Patient not taking: Reported on 05/18/2019 04/15/19 06/04/19  Tysinger, Kermit Balo, PA-C    Family History Family History  Problem Relation Age of Onset  . Hypertension Mother   . Stroke Mother   . Heart disease Father 44       died of MI  . Diabetes Father   . Hypertension Sister   . Diabetes Sister        type 2  . Heart disease Paternal Uncle        several uncles died in 53s with MI  . Hypertension Paternal Uncle   . Hypertension Maternal Grandmother   . Stroke Maternal Grandmother   . Hypertension Maternal Grandfather   . Cancer Paternal  Grandmother        lung  . Heart disease Paternal Uncle        MI in 65s    Social History Social History   Tobacco Use  . Smoking status: Current Every Day Smoker    Packs/day: 0.50    Start date: 09/03/2013    Last attempt to quit: 11/14/2017    Years since quitting: 2.1  . Smokeless tobacco: Never Used  . Tobacco comment: stress at his job  Substance Use Topics  . Alcohol use: Yes    Alcohol/week: 15.0 standard drinks    Types: 15 Glasses of wine per week  . Drug use: No     Allergies   Shellfish allergy   Review of Systems Review of Systems  Constitutional: Negative for diaphoresis and fatigue.  HENT: Negative for congestion.   Respiratory: Negative for cough, chest tightness and shortness of breath.   Cardiovascular:  Positive for chest pain. Negative for palpitations and leg swelling.  Gastrointestinal: Negative for nausea and vomiting.  Musculoskeletal: Positive for back pain and myalgias.     Physical Exam Triage Vital Signs ED Triage Vitals  Enc Vitals Group     BP 01/20/20 1059 (!) 156/96     Pulse Rate 01/20/20 1059 78     Resp 01/20/20 1059 18     Temp 01/20/20 1059 98.8 F (37.1 C)     Temp Source 01/20/20 1059 Oral     SpO2 01/20/20 1059 98 %     Weight --      Height --      Head Circumference --      Peak Flow --      Pain Score 01/20/20 1057 0     Pain Loc --      Pain Edu? --      Excl. in [place]? --    No data found.  Updated Vital Signs BP (!) 156/96 (BP Location: Left Arm)   Pulse 78   Temp 98.8 F (37.1 C) (Oral)   Resp 18   SpO2 98%      Physical Exam Constitutional:      General: He is not in acute distress.    Appearance: He is well-developed.  HENT:     Head: Normocephalic and atraumatic.     Mouth/Throat:     Comments: mask Eyes:     Conjunctiva/sclera: Conjunctivae normal.     Pupils: Pupils are equal, round, and reactive to light.  Cardiovascular:     Rate and Rhythm: Normal rate and regular rhythm.     Heart sounds: Normal heart sounds.  Pulmonary:     Effort: Pulmonary effort is normal. No respiratory distress.     Breath sounds: Normal breath sounds.  Chest:     Chest wall: No tenderness.  Abdominal:     General: Abdomen is flat. There is no distension.     Palpations: Abdomen is soft.     Tenderness: There is abdominal tenderness.     Comments: Mild, epigastrium  Musculoskeletal:        General: Normal range of motion.     Cervical back: Normal range of motion.     Comments: Mild tenderness thoracic muscles  Skin:    General: Skin is warm and dry.  Neurological:     Mental Status: He is alert.  Psychiatric:        Mood and Affect: Mood normal.        Behavior: Behavior normal.  Comments: Mild anxiety      UC Treatments /  Results  Labs (all labs ordered are listed, but only abnormal results are displayed) Labs Reviewed - No data to display  EKG- NSR, normal intervals, rate, rhythm   Radiology No results found.  Procedures Procedures (including critical care time)  Medications Ordered in UC Medications - No data to display  Initial Impression / Assessment and Plan / UC Course  I have reviewed the triage vital signs and the nursing notes.  Pertinent labs & imaging results that were available during my care of the patient were reviewed by me and considered in my medical decision making (see chart for details).     Differential diagnosis of chest pain discussed. Current chest pain is not likely cardiac. Advised to follow-up with primary care doctor. Advised strongly to quit smoking Final Clinical Impressions(s) / UC Diagnoses   Final diagnoses:  Chest pain, unspecified type     Discharge Instructions     See your primary care doctor in follow up Take the BP medicine EVERY DAY Dr Aleen Campi will refill   ED Prescriptions    None     PDMP not reviewed this encounter.   Eustace Moore, MD 01/20/20 1740

## 2020-01-20 NOTE — Discharge Instructions (Addendum)
See your primary care doctor in follow up Take the BP medicine EVERY DAY Dr Aleen Campi will refill

## 2020-01-24 ENCOUNTER — Other Ambulatory Visit: Payer: Self-pay | Admitting: Medical

## 2020-01-24 DIAGNOSIS — I10 Essential (primary) hypertension: Secondary | ICD-10-CM

## 2020-01-24 MED ORDER — LOSARTAN POTASSIUM-HCTZ 100-25 MG PO TABS: 1.0000 | ORAL_TABLET | Freq: Every day | ORAL | 3 refills | Status: DC

## 2020-01-26 ENCOUNTER — Ambulatory Visit: Payer: 59 | Admitting: Cardiology

## 2020-04-17 ENCOUNTER — Encounter (HOSPITAL_COMMUNITY): Payer: Self-pay | Admitting: Emergency Medicine

## 2020-04-17 ENCOUNTER — Emergency Department (HOSPITAL_COMMUNITY): Payer: 59

## 2020-04-17 ENCOUNTER — Emergency Department (HOSPITAL_COMMUNITY)
Admission: EM | Admit: 2020-04-17 | Discharge: 2020-04-18 | Discharge status: Against Medical Advice | Payer: 59 | Attending: Emergency Medicine | Admitting: Emergency Medicine

## 2020-04-17 ENCOUNTER — Other Ambulatory Visit: Payer: Self-pay

## 2020-04-17 DIAGNOSIS — R0789 Other chest pain: Secondary | ICD-10-CM | POA: Diagnosis not present

## 2020-04-17 DIAGNOSIS — Z5321 Procedure and treatment not carried out due to patient leaving prior to being seen by health care provider: Secondary | ICD-10-CM | POA: Insufficient documentation

## 2020-04-17 LAB — BASIC METABOLIC PANEL
Anion gap: 12 (ref 5–15)
BUN: 17 mg/dL (ref 6–20)
CO2: 27 mmol/L (ref 22–32)
Calcium: 9.4 mg/dL (ref 8.9–10.3)
Chloride: 102 mmol/L (ref 98–111)
Creatinine, Ser: 1.42 mg/dL — ABNORMAL HIGH (ref 0.61–1.24)
GFR calc Af Amer: >60 mL/min (ref >60)
GFR calc non Af Amer: >60 mL/min (ref >60)
Glucose, Bld: 96 mg/dL (ref 70–99)
Potassium: 3.7 mmol/L (ref 3.5–5.1)
Sodium: 141 mmol/L (ref 135–145)

## 2020-04-17 LAB — CBC
HCT: 42.7 % (ref 39.0–52.0)
Hemoglobin: 14.2 g/dL (ref 13.0–17.0)
MCH: 28.3 pg (ref 26.0–34.0)
MCHC: 33.3 g/dL (ref 30.0–36.0)
MCV: 85.1 fL (ref 80.0–100.0)
Platelets: 232 10*3/uL (ref 150–400)
RBC: 5.02 MIL/uL (ref 4.22–5.81)
RDW: 14.3 % (ref 11.5–15.5)
WBC: 7 10*3/uL (ref 4.0–10.5)
nRBC: 0 % (ref 0.0–0.2)

## 2020-04-17 LAB — TROPONIN I (HIGH SENSITIVITY)
Troponin I (High Sensitivity): 5 ng/L (ref <18)
Troponin I (High Sensitivity): 5 ng/L (ref <18)

## 2020-04-17 MED ORDER — SODIUM CHLORIDE 0.9% FLUSH: 3.0000 mL | Freq: Once | INTRAVENOUS | Status: DC

## 2020-04-17 NOTE — ED Triage Notes (Signed)
Pt arrives today with c/o of cp when he woke up this am- pt has history of HTN and HIV. This cp scared him because his dad and uncle had an MI at age 39. Pt currently has no pain.

## 2020-04-18 NOTE — ED Notes (Signed)
encouraged pt to stay pt said he couldn't wait any longer

## 2020-04-27 ENCOUNTER — Ambulatory Visit: Payer: 59 | Admitting: Cardiology

## 2020-04-28 ENCOUNTER — Telehealth (INDEPENDENT_AMBULATORY_CARE_PROVIDER_SITE_OTHER): Payer: 59 | Admitting: Cardiology

## 2020-04-28 ENCOUNTER — Other Ambulatory Visit: Payer: Self-pay | Admitting: Medical

## 2020-04-28 VITALS — BP 154/89 | HR 96 | Ht 72.0 in | Wt 261.0 lb

## 2020-04-28 DIAGNOSIS — R072 Precordial pain: Secondary | ICD-10-CM | POA: Diagnosis not present

## 2020-04-28 DIAGNOSIS — I10 Essential (primary) hypertension: Secondary | ICD-10-CM | POA: Diagnosis not present

## 2020-04-28 DIAGNOSIS — E7800 Pure hypercholesterolemia, unspecified: Secondary | ICD-10-CM

## 2020-04-28 DIAGNOSIS — Z01812 Encounter for preprocedural laboratory examination: Secondary | ICD-10-CM | POA: Diagnosis not present

## 2020-04-28 DIAGNOSIS — Z8249 Family history of ischemic heart disease and other diseases of the circulatory system: Secondary | ICD-10-CM | POA: Diagnosis not present

## 2020-04-28 MED ORDER — METOPROLOL TARTRATE 100 MG PO TABS
ORAL_TABLET | ORAL | 0 refills | Status: DC
Start: 2020-04-28 — End: 2021-02-16

## 2020-04-28 NOTE — Progress Notes (Addendum)
Virtual Visit via Telephone Note   This visit type was conducted due to national recommendations for restrictions regarding the COVID-19 Pandemic (e.g. social distancing) in an effort to limit this patient's exposure and mitigate transmission in our community.  Due to his co-morbid illnesses, this patient is at least at moderate risk for complications without adequate follow up.  This format is felt to be most appropriate for this patient at this time.  The patient did not have access to video technology/had technical difficulties with video requiring transitioning to audio format only (telephone).  All issues noted in this document were discussed and addressed.  No physical exam could be performed with this format.  Please refer to the patient's chart for his  consent to telehealth for [facility].   The patient was identified using 2 identifiers.  Patient Location: Home Provider Location: Home Office     Date:  04/28/2020   ID:  Douglas [name], DOB 1981/05/03, MRN [id number]  PCP:  Carlena Hurl, PA-C  Cardiologist:  Buford Dresser, MD PhD  Referring MD: Carlena Hurl, PA-C   CC: ER follow up  History of Present Illness:    Douglas [name] is a 39 y.o. male with a hx of HIV, hypertension. Initially seen 06/22/19 as a new consult at the request of Tysinger, Camelia Eng, PA-C for the evaluation and management of palpitations, chest pain, CV risk assessment.  Today: Presented to ER 5.24.21 with chest pain. ECG with new inferolateral TWI. hsTn 5, 5. Left before being seen by physician. Blood pressure ranged from 132/72-151/85 in triage.  Today BP 154/89. Taking losartan-HCTZ. No longer taking rosuvastatin. No major changes, but under stress.  Woke up with chest pain 04/17/20. Similar to chest pain he has had previously, but he was worried given his family history. Pain was on and off the entire day, no clear aggravating or alleviating factors. No chest pain since then.  Occurs rarely, but makes him worried.   Denies shortness of breath at rest or with normal exertion. No PND, orthopnea, LE edema or unexpected weight gain. No syncope or palpitations.  Past Medical History:  Diagnosis Date  . Family history of heart disease in male family member before age 87   . HIV infection ([place]) 2014  . Hypertension 2004  . Low HDL (under 40)   . Seasonal allergic rhinitis    weather changes, spring/fall  . Sickle cell trait [facility])     Past Surgical History:  Procedure Laterality Date  . NO PAST SURGERIES  03/2019    Current Medications: Current Outpatient Medications on File Prior to Visit  Medication Sig  . bictegravir-emtricitabine-tenofovir AF (BIKTARVY) 50-200-25 MG TABS tablet Take 1 tablet by mouth daily.  Marland Kitchen losartan-hydrochlorothiazide (HYZAAR) 100-25 MG tablet Take 1 tablet by mouth daily.  . [DISCONTINUED] rosuvastatin (CRESTOR) 10 MG tablet Take 1 tablet (10 mg total) by mouth at bedtime. (Patient not taking: Reported on 05/18/2019)   No current facility-administered medications on file prior to visit.     Allergies:   Shellfish allergy   Social History   Tobacco Use  . Smoking status: Current Every Day Smoker    Packs/day: 0.50    Start date: 09/03/2013    Last attempt to quit: 11/14/2017    Years since quitting: 2.5  . Smokeless tobacco: Never Used  . 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 Vaping Use: Never used  Substance Use Topics  . Alcohol use: Yes  Alcohol/week: 15.0 standard drinks    Types: 15 Glasses of wine per week  . Drug use: No     Family History: The patient's family history includes Cancer in his paternal grandmother; Diabetes in his father and sister; Heart disease in his paternal uncle and paternal uncle; Heart disease (age of onset: 63) in his father; Hypertension in his maternal grandfather, maternal grandmother, mother, paternal uncle, and sister; Stroke in his maternal grandmother and mother.  ROS:    Please see the history of present illness.  Additional pertinent ROS otherwise unremarkable.  EKGs/Labs/Other Studies Reviewed:    The following studies were reviewed today: ETT 2018/10/05  EKG:  EKG is personally reviewed.  The ekg ordered 06/04/19 demonstrates NSR. Borderline LVH/RAE given age  Recent Labs: 06/08/2019: TSH 1.260 12/16/2019: ALT 43 04/17/2020: BUN 17; Creatinine, Ser 1.42; Hemoglobin 14.2; Platelets 232; Potassium 3.7; Sodium 141  Recent Lipid Panel    Component Value Date/Time   CHOL 205 (H) 12/16/2019 0904   CHOL 187 06/14/2019 1145   TRIG 127 12/16/2019 0904   HDL 45 12/16/2019 0904   HDL 48 06/14/2019 1145   CHOLHDL 4.6 12/16/2019 0904   VLDL 14 01/02/2017 1359   LDLCALC 135 (H) 12/16/2019 0904    Physical Exam:    VS:  BP (!) 154/89   Pulse 96   Ht 6' (1.829 m)   Wt 261 lb (118.4 kg)   BMI 35.40 kg/m     Wt Readings from Last 3 Encounters:  04/28/20 261 lb (118.4 kg)  01/17/20 263 lb 3.2 oz (119.4 kg)  09/09/19 262 lb 12.8 oz (119.2 kg)    Speaking comfortably on the phone, no audible wheezing In no acute distress Alert and oriented Normal affect Normal speech  ASSESSMENT:    1. Precordial pain   2. Pre-procedure lab exam   3. Essential hypertension   4. Family history of heart disease   5. Pure hypercholesterolemia    PLAN:    History of palpitations, chest pain:  -recently seen in ER for this -normal ETT 2019 -given recurrence, will proceed with coronary CTA -discussed false positive/false negative risk, radiation risk, and risk of IV contrast dye. Based on shared decision making, decision was made to pursue CT coronary angiography. -will give one time dose of metoprolol 2 hours prior to scheduled test -counseled on need to get BMET prior to test -counseled on use of sublingual nitroglycerin and its importance to a good test  Cardiac risk counseling: -has strong family history on his father's side, though not technically  premature -Personal risk factors:  Hypertension: above goal today, but working on weight loss. Recommend close monitoring, may need increase in medications  Hypercholesterolemia: LDL 135 on 12/16/19. Has stopped taking statin.  HIV: discussed that this is associated with CAD independently of other factors  Obesity: BMI 35. Working on weight loss   Pprevention recommendations: -recommend heart healthy/Mediterranean diet, with whole grains, fruits, vegetable, fish, lean meats, nuts, and olive oil. Limit salt. -recommend moderate walking, 3-5 times/week for 30-50 minutes each session. Aim for at least 150 minutes.week. Goal should be pace of 3 miles/hours, or walking 1.5 miles in 30 minutes -recommend avoidance of tobacco products. Avoid excess alcohol. -Additional risk factor control:  -Diabetes: A1c is 5.7, no history  Total time of encounter: 12 minutes spent in direct patient care. Remainder of non-face-to-face time involved reviewing chart documents/testing relevant to the patient encounter and documentation in the medical record.  Buford Dresser, MD, PhD [facility]  [place] for follow up: 1 year or sooner based on results of study  Medication Adjustments/Labs and Tests Ordered: Current medicines are reviewed at length with the patient today.  Concerns regarding medicines are outlined above.  Orders Placed This Encounter  Procedures  . CT CORONARY MORPH W/CTA COR W/SCORE W/CA W/CM &/OR WO/CM  . CT CORONARY FRACTIONAL FLOW RESERVE DATA PREP  . CT CORONARY FRACTIONAL FLOW RESERVE FLUID ANALYSIS  . Basic metabolic panel   Meds ordered this encounter  Medications  . metoprolol tartrate (LOPRESSOR) 100 MG tablet    Sig: TAKE 1 TABLET 2 HR PRIOR TO CARDIAC PROCEDURE    Dispense:  1 tablet    Refill:  0    Patient Instructions  Medication Instructions:  Your Physician recommend you continue on your current medication as directed.    *If you need a refill on  your cardiac medications before your next appointment, please call your pharmacy*   Lab Work: Your physician recommends that you return for lab work 2 weeks prior to procedure ( BMP).  If you have labs (blood work) drawn today and your tests are completely normal, you will receive your results only by: Marland Kitchen MyChart Message (if you have MyChart) OR . A paper copy in the mail If you have any lab test that is abnormal or we need to change your treatment, we will call you to review the results.   Testing/Procedures: Cardiac CT Angiography (CTA), is a special type of CT scan that uses a computer to produce multi-dimensional views of major blood vessels throughout the body. In CT angiography, a contrast material is injected through an IV to help visualize the blood vessels [facility]   Follow-Up: At [facility], you and your health needs are our priority.  As part of our continuing mission to provide you with exceptional heart care, we have created designated Provider Care Teams.  These Care Teams include your primary Cardiologist (physician) and Advanced Practice Providers (APPs -  Physician Assistants and Nurse Practitioners) who all work together to provide you with the care you need, when you need it.  We recommend signing up for the patient portal called "MyChart".  Sign up information is provided on this After Visit Summary.  MyChart is used to connect with patients for Virtual Visits (Telemedicine).  Patients are able to view lab/test results, encounter notes, upcoming appointments, etc.  Non-urgent messages can be sent to your provider as well.   To learn more about what you can do with MyChart, go to [email or web address].    Your next appointment:   1 year   The format for your next appointment:   In Person  Provider:   Buford Dresser, MD  Your cardiac CT will be scheduled at one of the below locations:   [facility] [address] [place], [place] [address] [phone]   If scheduled at [facility], please arrive at the [facility] main entrance of [facility] 30 minutes prior to test start time. Proceed to the [facility] Radiology Department (first floor) to check-in and test prep.  If scheduled at [facility], please arrive 15 mins early for check-in and test prep.  Please follow these instructions carefully (unless otherwise directed):  Hold all erectile dysfunction medications at least 3 days (72 hrs) prior to test.  On the Night Before the Test: . Be sure to Drink plenty of water. . Do not  consume any caffeinated/decaffeinated beverages or chocolate 12 hours prior to your test. . Do not take any antihistamines 12 hours prior to your test.   On the Day of the Test: . Drink plenty of water. Do not drink any water within one hour of the test. . Do not eat any food 4 hours prior to the test. . You may take your regular medications prior to the test.  . Take metoprolol (Lopressor) 100 mg two hours prior to test. . HOLD Losartan-Hydrochlorothiazide 100-25 mg morning of the test.       After the Test: . Drink plenty of water. . After receiving IV contrast, you may experience a mild flushed feeling. This is normal. . On occasion, you may experience a mild rash up to 24 hours after the test. This is not dangerous. If this occurs, you can take Benadryl 25 mg and increase your fluid intake. . If you experience trouble breathing, this can be serious. If it is severe call 911 IMMEDIATELY. If it is mild, please call our office. . If you take any of these medications: Glipizide/Metformin, Avandament, Glucavance, please do not take 48 hours after completing test unless otherwise instructed.   Once we have confirmed authorization from your insurance company, we will call you to set up a date and time for your test.   For non-scheduling related questions, please contact  the cardiac imaging nurse navigator should you have any questions/concerns: Marchia Bond, Cardiac Imaging Nurse Navigator Burley Saver, Interim Cardiac Imaging Nurse [place] [facility] Direct Office Dial: [phone]   For scheduling needs, including cancellations and rescheduling, please call [phone].        Signed, Buford Dresser, MD PhD 04/28/2020     [facility]

## 2020-04-28 NOTE — Patient Instructions (Addendum)
Medication Instructions:  Your Physician recommend you continue on your current medication as directed.    *If you need a refill on your cardiac medications before your next appointment, please call your pharmacy*   Lab Work: Your physician recommends that you return for lab work 2 weeks prior to procedure ( BMP).  If you have labs (blood work) drawn today and your tests are completely normal, you will receive your results only by: Marland Kitchen MyChart Message (if you have MyChart) OR . A paper copy in the mail If you have any lab test that is abnormal or we need to change your treatment, we will call you to review the results.   Testing/Procedures: Cardiac CT Angiography (CTA), is a special type of CT scan that uses a computer to produce multi-dimensional views of major blood vessels throughout the body. In CT angiography, a contrast material is injected through an IV to help visualize the blood vessels [facility]   Follow-Up: At [facility], you and your health needs are our priority.  As part of our continuing mission to provide you with exceptional heart care, we have created designated Provider Care Teams.  These Care Teams include your primary Cardiologist (physician) and Advanced Practice Providers (APPs -  Physician Assistants and Nurse Practitioners) who all work together to provide you with the care you need, when you need it.  We recommend signing up for the patient portal called "MyChart".  Sign up information is provided on this After Visit Summary.  MyChart is used to connect with patients for Virtual Visits (Telemedicine).  Patients are able to view lab/test results, encounter notes, upcoming appointments, etc.  Non-urgent messages can be sent to your provider as well.   To learn more about what you can do with MyChart, go to [email or web address].    Your next appointment:   1 year   The format for your next appointment:   In Person  Provider:   Buford Dresser, MD  Your cardiac CT will be scheduled at one of the below locations:   [facility] [address] [place], [place] [address] [phone]   If scheduled at [facility], please arrive at the [facility] main entrance of [facility] 30 minutes prior to test start time. Proceed to the [facility] Department (first floor) to check-in and test prep.  If scheduled at [facility], please arrive 15 mins early for check-in and test prep.  Please follow these instructions carefully (unless otherwise directed):  Hold all erectile dysfunction medications at least 3 days (72 hrs) prior to test.  On the Night Before the Test: . Be sure to Drink plenty of water. . Do not consume any caffeinated/decaffeinated beverages or chocolate 12 hours prior to your test. . Do not take any antihistamines 12 hours prior to your test.   On the Day of the Test: . Drink plenty of water. Do not drink any water within one hour of the test. . Do not eat any food 4 hours prior to the test. . You may take your regular medications prior to the test.  . Take metoprolol (Lopressor) 100 mg two hours prior to test. . HOLD Losartan-Hydrochlorothiazide 100-25 mg morning of the test.       After the Test: . Drink plenty of water. . After receiving IV contrast, you may experience a mild flushed feeling. This is normal. . On occasion, you may experience a mild rash up to 24  hours after the test. This is not dangerous. If this occurs, you can take Benadryl 25 mg and increase your fluid intake. . If you experience trouble breathing, this can be serious. If it is severe call 911 IMMEDIATELY. If it is mild, please call our office. . If you take any of these medications: Glipizide/Metformin, Avandament, Glucavance, please do not take 48 hours after completing test unless otherwise instructed.   Once we have confirmed authorization from your insurance  company, we will call you to set up a date and time for your test.   For non-scheduling related questions, please contact the cardiac imaging nurse navigator should you have any questions/concerns: Marchia Bond, Cardiac Imaging Nurse Navigator Burley Saver, Interim Cardiac Imaging Nurse [place] [facility] Direct Office Dial: [phone]   For scheduling needs, including cancellations and rescheduling, please call [phone].

## 2020-05-18 LAB — BASIC METABOLIC PANEL
BUN/Creatinine Ratio: 13 (ref 9–20)
BUN: 15 mg/dL (ref 6–20)
CO2: 22 mmol/L (ref 20–29)
Calcium: 9.3 mg/dL (ref 8.7–10.2)
Chloride: 98 mmol/L (ref 96–106)
Creatinine, Ser: 1.15 mg/dL (ref 0.76–1.27)
GFR calc Af Amer: 93 mL/min/{1.73_m2} (ref >59)
GFR calc non Af Amer: 80 mL/min/{1.73_m2} (ref >59)
Glucose: 97 mg/dL (ref 65–99)
Potassium: 3.7 mmol/L (ref 3.5–5.2)
Sodium: 139 mmol/L (ref 134–144)

## 2020-05-23 ENCOUNTER — Telehealth (HOSPITAL_COMMUNITY): Payer: Self-pay | Admitting: *Deleted

## 2020-05-23 NOTE — Telephone Encounter (Signed)
Reaching out to patient to offer assistance regarding upcoming cardiac imaging study; pt verbalizes understanding of appt date/time, parking situation and where to check in, pre-test NPO status and medications ordered, and verified current allergies; name and call back number provided for further questions should they arise ° °Ellery Meroney Tai RN Navigator Cardiac Imaging ° Heart and Vascular °[phone] office °[phone] cell ° °

## 2020-05-24 ENCOUNTER — Ambulatory Visit (HOSPITAL_COMMUNITY)
Admission: RE | Admit: 2020-05-24 | Discharge: 2020-05-24 | Disposition: A | Discharge status: Home / Self Care | Payer: 59 | Source: Ambulatory Visit | Attending: Cardiology | Admitting: Cardiology

## 2020-05-24 ENCOUNTER — Encounter: Payer: 59 | Admitting: *Deleted

## 2020-05-24 ENCOUNTER — Other Ambulatory Visit: Payer: Self-pay

## 2020-05-24 ENCOUNTER — Encounter (HOSPITAL_COMMUNITY): Payer: Self-pay

## 2020-05-24 DIAGNOSIS — Z006 Encounter for examination for normal comparison and control in clinical research program: Secondary | ICD-10-CM

## 2020-05-24 DIAGNOSIS — R072 Precordial pain: Secondary | ICD-10-CM | POA: Insufficient documentation

## 2020-05-24 MED ORDER — IOHEXOL 350 MG/ML SOLN
80.0000 mL | Freq: Once | INTRAVENOUS | Status: AC | PRN
  Administered 2020-05-24: 80 mL via INTRAVENOUS

## 2020-05-24 MED ORDER — NITROGLYCERIN 0.4 MG SL SUBL
0.8000 mg | SUBLINGUAL_TABLET | Freq: Once | SUBLINGUAL | Status: AC
  Administered 2020-05-24: 0.8 mg via SUBLINGUAL

## 2020-05-24 MED ORDER — NITROGLYCERIN 0.4 MG SL SUBL
SUBLINGUAL_TABLET | SUBLINGUAL | Status: AC
  Filled 2020-05-24: qty 2

## 2020-05-24 NOTE — Research (Signed)
CADFEM Informed Consent                  Subject Name:   Douglas [name]   Subject met inclusion and exclusion criteria.  The informed consent form, study requirements and expectations were reviewed with the subject and questions and concerns were addressed prior to the signing of the consent form.  The subject verbalized understanding of the trial requirements.  The subject agreed to participate in the CADFEM trial and signed the informed consent.  The informed consent was obtained prior to performance of any protocol-specific procedures for the subject.  A copy of the signed informed consent was given to the subject and a copy was placed in the subject's medical record.   [place] Anas Reister, Research Assistant  05/24/2020 11:00a.m.

## 2020-05-24 NOTE — Progress Notes (Signed)
Patient tolerated CT well. Drank water after and ambulated to exit steady gait.

## 2020-05-30 ENCOUNTER — Encounter: Payer: Self-pay | Admitting: Cardiology

## 2020-06-05 ENCOUNTER — Other Ambulatory Visit: Payer: Self-pay

## 2020-06-05 DIAGNOSIS — B20 Human immunodeficiency virus [HIV] disease: Secondary | ICD-10-CM

## 2020-06-05 MED ORDER — BICTEGRAVIR-EMTRICITAB-TENOFOV 50-200-25 MG PO TABS: 1.0000 | ORAL_TABLET | Freq: Every day | ORAL | 5 refills | Status: DC

## 2020-06-15 ENCOUNTER — Ambulatory Visit
Admission: EM | Admit: 2020-06-15 | Discharge: 2020-06-15 | Disposition: A | Discharge status: Home / Self Care | Payer: 59 | Attending: Family Medicine | Admitting: Family Medicine

## 2020-06-15 ENCOUNTER — Other Ambulatory Visit: Payer: Self-pay

## 2020-06-15 ENCOUNTER — Encounter: Payer: Self-pay | Admitting: Emergency Medicine

## 2020-06-15 ENCOUNTER — Ambulatory Visit (INDEPENDENT_AMBULATORY_CARE_PROVIDER_SITE_OTHER): Payer: 59

## 2020-06-15 DIAGNOSIS — T148XXA Other injury of unspecified body region, initial encounter: Secondary | ICD-10-CM

## 2020-06-15 DIAGNOSIS — M109 Gout, unspecified: Secondary | ICD-10-CM

## 2020-06-15 DIAGNOSIS — M79645 Pain in left finger(s): Secondary | ICD-10-CM

## 2020-06-15 DIAGNOSIS — M7989 Other specified soft tissue disorders: Secondary | ICD-10-CM

## 2020-06-15 MED ORDER — PREDNISONE 20 MG PO TABS
40.0000 mg | ORAL_TABLET | Freq: Every day | ORAL | 0 refills | Status: AC
Start: 2020-06-15 — End: 2020-06-20

## 2020-06-15 NOTE — Discharge Instructions (Addendum)
I am treating your symptoms today as a suspected gout attack You will start prednisone 40 mg once daily for 5 days. Apply ice packs to finger at least a few times per day to reduce swelling.  Keep finger splinted during activity to avoid reinjury. [facility]-up with [facility] for further evaluation of your both your middle and ring finger. I have attached a copy of your radiological read from today's x-ray exam to have available at your appointment with orthopedic.

## 2020-06-15 NOTE — ED Provider Notes (Signed)
[name]    [name]: [id number] Arrival date & time: 06/15/20  1139      History   Chief Complaint Chief Complaint  Patient presents with  . Hand Pain    HPI Douglas [name] is a 39 y.o. male.   HPI  Patient reports a 2-day history of left fourth finger pain and swelling.  Finger has gradually become more painful and he is unable to flex his fourth finger.  Today the pain increased in severity.  Previously he had had some PIP joint swelling and followed up with his primary care provider however at that time he was not experiencing any pain and no interventions took place.  This is the first time he has had any pain related to joint swelling.  He has not taken any medication for his symptoms.  He denies any injury.  He has had no prior history of gout.  He endorses alcohol consumption and he eats seafood.  Has not had any increased volume of alcohol or seafood intake.  He denies any pain or trauma to any other areas involving the left hand or fingers.  Past Medical History:  Diagnosis Date  . Family history of heart disease in male family member before age 5   . HIV infection (HCC) 2014  . Hypertension 2004  . Low HDL (under 40)   . Seasonal allergic rhinitis    weather changes, spring/fall  . Sickle cell trait [facility])     Patient Active Problem List   Diagnosis Date Noted  . Family history of heart disease 06/22/2019  . Pure hypercholesterolemia 06/22/2019  . Heart palpitations 06/07/2019  . Family history of premature CAD 04/14/2019  . Atypical chest pain 09/18/2018  . Obesity 01/01/2018  . Screening examination for venereal disease 07/27/2014  . Screening for condition 07/27/2014  . Seborrheic dermatitis 08/19/2013  . Essential hypertension 06/25/2013  . Human immunodeficiency virus (HIV) disease (HCC) 06/25/2013    Past Surgical History:  Procedure Laterality Date  . NO PAST SURGERIES  03/2019       Home Medications    Prior to Admission  medications   Medication Sig Start Date End Date Taking? Authorizing Provider  bictegravir-emtricitabine-tenofovir AF (BIKTARVY) 50-200-25 MG TABS tablet Take 1 tablet by mouth daily. 06/05/20   [name], MD  losartan-hydrochlorothiazide (HYZAAR) 100-25 MG tablet Take 1 tablet by mouth daily. 01/24/20   Tysinger, Kermit Balo, PA-C  metoprolol tartrate (LOPRESSOR) 100 MG tablet TAKE 1 TABLET 2 HR PRIOR TO CARDIAC PROCEDURE 04/28/20   [name], MD  predniSONE (DELTASONE) 20 MG tablet Take 2 tablets (40 mg total) by mouth daily with breakfast for 5 days. 06/15/20 06/20/20  [name], [name]  rosuvastatin (CRESTOR) 10 MG tablet Take 1 tablet (10 mg total) by mouth at bedtime. Patient not taking: Reported on 05/18/2019 04/15/19 06/04/19  Tysinger, Kermit Balo, PA-C    Family History Family History  Problem Relation Age of Onset  . Hypertension Mother   . Stroke Mother   . Heart disease Father 8       died of MI  . Diabetes Father   . Hypertension Sister   . Diabetes Sister        type 2  . Heart disease Paternal Uncle        several uncles died in 78s with MI  . Hypertension Paternal Uncle   . Hypertension Maternal Grandmother   . Stroke Maternal Grandmother   . Hypertension Maternal Grandfather   .  Cancer Paternal Grandmother        lung  . Heart disease Paternal Uncle        MI in 29s    Social History Social History   Tobacco Use  . Smoking status: Current Every Day Smoker    Packs/day: 0.50    Start date: 09/03/2013    Last attempt to quit: 11/14/2017    Years since quitting: 2.5  . Smokeless tobacco: Never Used  . 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 Vaping Use: Never used  Substance Use Topics  . Alcohol use: Yes    Alcohol/week: 15.0 standard drinks    Types: 15 Glasses of wine per week  . Drug use: No     Allergies   Shellfish allergy   Review of Systems Review of Systems Pertinent negatives listed in HPI   Physical Exam Triage  Vital Signs ED Triage Vitals  Enc Vitals Group     BP 06/15/20 1147 (!) 152/89     Pulse Rate 06/15/20 1147 80     Resp 06/15/20 1147 16     Temp 06/15/20 1147 98.8 F (37.1 C)     Temp Source 06/15/20 1147 Oral     SpO2 06/15/20 1147 95 %     Weight --      Height --      Head Circumference --      Peak Flow --      Pain Score 06/15/20 1154 10     Pain Loc --      Pain Edu? --      Excl. in GC? --    No data found.  Updated Vital Signs BP (!) 152/89 (BP Location: Left Arm)   Pulse 80   Temp 98.8 F (37.1 C) (Oral)   Resp 16   SpO2 95%   Visual Acuity Right Eye Distance:   Left Eye Distance:   Bilateral Distance:    Right Eye Near:   Left Eye Near:    Bilateral Near:     Physical Exam General appearance: alert, well developed, well nourished, cooperative and in no distress Head: Normocephalic, without obvious abnormality, atraumatic Respiratory: Respirations even and unlabored, normal respiratory rate Heart: rate and rhythm normal. No gallop or murmurs noted on exam  Extremities: left 4th digit-warm, erythematous, edematous, tender to touch, limited flexion due to swelling Skin: Skin color, texture, turgor normal. No rashes seen  Psych: Appropriate mood and affect.   UC Treatments / Results  Labs (all labs ordered are listed, but only abnormal results are displayed) Labs Reviewed - No data to display  EKG   Radiology CT CORONARY MORPH W/CTA COR W/SCORE W/CA W/CM &/OR WO/CM  Addendum Date: 05/24/2020   ADDENDUM REPORT: 05/24/2020 16:28 HISTORY: Chest pain, nonspecific EXAM: Cardiac/Coronary CT TECHNIQUE: The patient was scanned on a [employer]. PROTOCOL: A 120 kV prospective scan was triggered in the descending thoracic aorta at 111 HU's. Axial non-contrast 3 mm slices were carried out through the heart. The data set was analyzed on a dedicated work station and scored using the Agatson method. Gantry rotation speed was 250 msecs and collimation was  0.6 mm. Beta blockade and 0.8 mg of sl NTG was given. The 3D data set was reconstructed in 5% intervals of 35-75% of the R-R cycle. Diastolic phases were analyzed on a dedicated work station using MPR, MIP and VRT modes. The patient received 82mL OMNIPAQUE IOHEXOL 350 MG/ML SOLN of contrast. FINDINGS: Coronary  calcium score: The patient's coronary artery calcium score is 0, which places the patient in the 0 percentile. Coronary arteries: Normal coronary origins.  Right dominance. Right Coronary Artery: Normal caliber vessel, gives rise to PDA. No significant plaque or stenosis. Left Main Coronary Artery: Normal caliber vessel. No significant plaque or stenosis. Left Anterior Descending Coronary Artery: Normal caliber vessel. No significant plaque or stenosis. Gives rise to 1 small and one large/bifurcating diagonal branches. Left Circumflex Artery: Normal caliber vessel. No significant plaque or stenosis. Gives rise to 2 OM branches. Distal LCx also supplies posterior wall Aorta: Normal size, 27 mm at the mid ascending aorta (level of the PA bifurcation) measured double oblique. No calcifications. No dissection. Aortic Valve: No calcifications. Trileaflet. Other findings: Normal pulmonary vein drainage into the left atrium. Normal left atrial appendage without a thrombus. Normal size of the pulmonary artery. IMPRESSION: 1. No evidence of CAD, CADRADS = 0. 2. Coronary calcium score of 0. This was 0 percentile for age and sex matched control. 3. Normal coronary origin with right dominance. Electronically Signed   By: [name] M.D.   On: 05/24/2020 16:28   Result Date: 05/24/2020 EXAM: OVER-READ INTERPRETATION  CT CHEST The following report is an over-read performed by radiologist Dr. Trudie Reed of [facility] on 05/24/2020. This over-read does not include interpretation of cardiac or coronary anatomy or pathology. The coronary calcium score/coronary CTA interpretation by the  cardiologist is attached. COMPARISON:  None. FINDINGS: Within the visualized portions of the thorax there are no suspicious appearing pulmonary nodules or masses, there is no acute consolidative airspace disease, no pleural effusions, no pneumothorax and no lymphadenopathy. Visualized portions of the upper abdomen demonstrates diffuse low attenuation throughout the visualized hepatic parenchyma, indicative of hepatic steatosis. There are no aggressive appearing lytic or blastic lesions noted in the visualized portions of the skeleton. IMPRESSION: 1. Hepatic steatosis. Electronically Signed: By: Trudie Reed M.D. On: 05/24/2020 13:01   DG Finger Ring Left  Result Date: 06/15/2020 CLINICAL DATA:  Worsening pain, no specific injury, felt shifting sensation and pain while doing laundry EXAM: LEFT RING FINGER 2+V COMPARISON:  None. FINDINGS: No acute fracture or dislocation of the left ring digit. There are corticated appearing fragments adjacent to the lateral aspect of the base of the third middle phalanx. Joint spaces are preserved. Soft tissues are unremarkable. IMPRESSION: 1. No acute fracture or dislocation of the left ring digit. 2. There are corticated appearing fragments adjacent to the lateral (thenar) aspect of the base of the third middle phalanx, consistent with sequelae of prior trauma. There may be instability of the joint related to this finding. Correlate with physical examination findings. Nonemergent MRI may be used to further evaluate integrity of the tendinous and ligamentous structures of the digit if clinically indicated. Electronically Signed   By: Lauralyn Primes M.D.   On: 06/15/2020 12:18    Procedures Procedures (including critical care time)  Medications Ordered in UC Medications - No data to display  Initial Impression / Assessment and Plan / UC Course  I have reviewed the triage vital signs and the nursing notes.  Pertinent labs & imaging results that were available during  my care of the patient were reviewed by me and considered in my medical decision making (see chart for details).     Left 4th finger, suspect acute gout attack given exam findings. Incidental findings of bony fragments noted  corticated appearing fragments adjacent to the lateral aspect of the base of  the third middle phalanx warranted follow-up with hand specialist. Recommended orthopedic evaluation. If symptoms 4th digit do no improve, recommend follow-up with orthopedics . Trial a short course of prednisone 40 mg daily x 5 days. Apply to ice to affected finger multiple times daily for management of swelling. An After Visit Summary was printed and given to the patient/family. Precautions discussed. Red flags discussed. Questions invited and answered.They voiced understanding and agreement.  Final Clinical Impressions(s) / UC Diagnoses   Final diagnoses:  Fragmented bone, left 3rd digit  Swelling of left ring finger  Pain in finger of left hand, 4th finger  Acute gout, unspecified cause, unspecified site     Discharge Instructions     I am treating your symptoms today as a suspected gout attack You will start prednisone 40 mg once daily for 5 days. Apply ice packs to finger at least a few times per day to reduce swelling.  Keep finger splinted during activity to avoid reinjury. [facility]-up with [facility] for further evaluation of your both your middle and ring finger. I have attached a copy of your radiological read from today's x-ray exam to have available at your appointment with orthopedic.   ED Prescriptions    Medication Sig Dispense Auth. Provider   predniSONE (DELTASONE) 20 MG tablet Take 2 tablets (40 mg total) by mouth daily with breakfast for 5 days. 10 tablet Bing NeighborsHarris, Brelan Hannen S, [name]     PDMP not reviewed this encounter.   Bing NeighborsHarris, Chelse Matas S, [name] 06/19/20 2029

## 2020-06-15 NOTE — ED Triage Notes (Signed)
Pt presents to [facility] for assessment of pain to ring finger of the left hand since yesterday.  Patient states he has had an overgrown bone protruding from the area "for a while" but it wasn't painful.  States yesterday he felt a "shift" while he was doing dishes, but the pain did not begin until he was trying to go to sleep.

## 2020-07-28 ENCOUNTER — Encounter: Payer: Self-pay | Admitting: Medical

## 2020-07-28 ENCOUNTER — Other Ambulatory Visit: Payer: Self-pay

## 2020-07-28 ENCOUNTER — Ambulatory Visit (INDEPENDENT_AMBULATORY_CARE_PROVIDER_SITE_OTHER): Payer: 59 | Admitting: Medical

## 2020-07-28 VITALS — BP 120/88 | HR 73 | Ht 72.0 in | Wt 258.2 lb

## 2020-07-28 DIAGNOSIS — Z125 Encounter for screening for malignant neoplasm of prostate: Secondary | ICD-10-CM | POA: Diagnosis not present

## 2020-07-28 DIAGNOSIS — R361 Hematospermia: Secondary | ICD-10-CM | POA: Insufficient documentation

## 2020-07-28 DIAGNOSIS — Z113 Encounter for screening for infections with a predominantly sexual mode of transmission: Secondary | ICD-10-CM | POA: Diagnosis not present

## 2020-07-28 LAB — POCT URINALYSIS DIP (PROADVANTAGE DEVICE)
Bilirubin, UA: NEGATIVE
Blood, UA: NEGATIVE
Glucose, UA: NEGATIVE mg/dL
Ketones, POC UA: NEGATIVE mg/dL
Leukocytes, UA: NEGATIVE
Nitrite, UA: NEGATIVE
Protein Ur, POC: NEGATIVE mg/dL
Specific Gravity, Urine: 1.02
Urobilinogen, Ur: NEGATIVE
pH, UA: 6 (ref 5.0–8.0)

## 2020-07-28 MED ORDER — SULFAMETHOXAZOLE-TRIMETHOPRIM 800-160 MG PO TABS
1.0000 | ORAL_TABLET | Freq: Two times a day (BID) | ORAL | 0 refills | Status: DC
Start: 2020-07-28 — End: 2021-02-16

## 2020-07-28 NOTE — Progress Notes (Signed)
Subjective: Chief Complaint  Patient presents with  . Other   Here for concern about blood in semen.  Has seen blood in semen twice in past week.  No pain, no blood in urine, no pain with uriantion.  No fever, no belly pain, no penile discharge, no diarrha, no body aches or chills.  Sexually active.  Not using condoms.  Male partern is also HIV+ and on therapy as well.  No recent other sexual partners.  No knowkn prostate issue in the past.  No prior similar symptoms  No recent trauma or heavy exercise.  No rash, no sores, no penile discharge.  No other aggravating or relieving factors. No other complaint.   Past Medical History:  Diagnosis Date  . Family history of heart disease in male family member before age 76   . HIV infection (HCC) 2014  . Hypertension 2004  . Low HDL (under 40)   . Seasonal allergic rhinitis    weather changes, spring/fall  . Sickle cell trait (HCC)    Current Outpatient Medications on File Prior to Visit  Medication Sig Dispense Refill  . bictegravir-emtricitabine-tenofovir AF (BIKTARVY) 50-200-25 MG TABS tablet Take 1 tablet by mouth daily. 30 tablet 5  . losartan-hydrochlorothiazide (HYZAAR) 100-25 MG tablet Take 1 tablet by mouth daily. 90 tablet 3  . metoprolol tartrate (LOPRESSOR) 100 MG tablet TAKE 1 TABLET 2 HR PRIOR TO CARDIAC PROCEDURE (Patient not taking: Reported on 07/28/2020) 1 tablet 0  . [DISCONTINUED] rosuvastatin (CRESTOR) 10 MG tablet Take 1 tablet (10 mg total) by mouth at bedtime. (Patient not taking: Reported on 05/18/2019) 90 tablet 0   No current facility-administered medications on file prior to visit.   ROS as in subjective   Objective; BP 120/88   Pulse 73   Ht 6' (1.829 m)   Wt 258 lb 3.2 oz (117.1 kg)   SpO2 97%   BMI 35.02 kg/m   Gen: wd, wn, nad, African American male Abdomen: +bs, soft, notnender, no mass DRE/GU - declined   Assessment: Encounter Diagnoses  Name Primary?  . Blood in semen Yes  . Screen for STD  (sexually transmitted disease)   . Screening for prostate cancer     Plan: We discussed symptoms, concerns, possible causes.  If new symptoms of pain, nausea, body aches, chills, fever or other illness symptoms over the weekend then begin Bactrim.  Otherwise await labs  Douglas [name] was seen today for other.  Diagnoses and all orders for this visit:  Blood in semen -     GC/Chlamydia Probe Amp -     RPR -     Urine Culture -     POCT Urinalysis DIP (Proadvantage Device) -     PSA  Screen for STD (sexually transmitted disease) -     GC/Chlamydia Probe Amp -     RPR -     Urine Culture -     POCT Urinalysis DIP (Proadvantage Device) -     PSA  Screening for prostate cancer -     GC/Chlamydia Probe Amp -     RPR -     Urine Culture -     POCT Urinalysis DIP (Proadvantage Device) -     PSA  Other orders -     sulfamethoxazole-trimethoprim (BACTRIM DS) 800-160 MG tablet; Take 1 tablet by mouth 2 (two) times daily.  f/u pending labs

## 2020-07-29 LAB — PSA: Prostate Specific Ag, Serum: 1.9 ng/mL (ref 0.0–4.0)

## 2020-07-29 LAB — RPR: RPR Ser Ql: NONREACTIVE

## 2020-07-30 LAB — URINE CULTURE: Organism ID, Bacteria: NO GROWTH

## 2020-07-31 LAB — GC/CHLAMYDIA PROBE AMP
Chlamydia trachomatis, NAA: NEGATIVE
Neisseria Gonorrhoeae by PCR: NEGATIVE

## 2020-09-05 ENCOUNTER — Other Ambulatory Visit: Payer: Self-pay

## 2020-09-05 ENCOUNTER — Ambulatory Visit (INDEPENDENT_AMBULATORY_CARE_PROVIDER_SITE_OTHER): Payer: 59 | Admitting: Family Medicine

## 2020-09-05 ENCOUNTER — Encounter: Payer: Self-pay | Admitting: Family Medicine

## 2020-09-05 VITALS — BP 118/72 | HR 84 | Wt 260.0 lb

## 2020-09-05 DIAGNOSIS — Z23 Encounter for immunization: Secondary | ICD-10-CM

## 2020-09-05 DIAGNOSIS — I10 Essential (primary) hypertension: Secondary | ICD-10-CM | POA: Diagnosis not present

## 2020-09-05 DIAGNOSIS — F1010 Alcohol abuse, uncomplicated: Secondary | ICD-10-CM

## 2020-09-05 DIAGNOSIS — E7800 Pure hypercholesterolemia, unspecified: Secondary | ICD-10-CM | POA: Diagnosis not present

## 2020-09-05 DIAGNOSIS — Z6835 Body mass index (BMI) 35.0-35.9, adult: Secondary | ICD-10-CM

## 2020-09-05 DIAGNOSIS — Z720 Tobacco use: Secondary | ICD-10-CM | POA: Diagnosis not present

## 2020-09-05 DIAGNOSIS — E6609 Other obesity due to excess calories: Secondary | ICD-10-CM

## 2020-09-05 NOTE — Progress Notes (Signed)
SUBJECTIVE:   CHIEF COMPLAINT / HPI:   Patient is here to establish care  Current concerns: None at this time   Past medical history: HIV-2014 on Biktarvy 50-2 100-25 mg once daily HTN 2005 on losartan hydrochlorothiazide 100-25 mg once a day  Past surgical history None   Family history Maternal diabetes, hyperlipidemia, HTN, CVA at 39 yo  Paternal MI at 39 yo leading to his death, diabetes  Sister with diabetes and early death after diabetic coma at 34.  Grandparents with diabetes and hypertension Uncle with MI at 64-50 yo Aunt with diabetes  Social history Lives in [place]. Works as a Holiday representative. Exercises daily walking roughly 5 miles per day. Not much fried foods. Drinks water. Snacks are fruits. Smokes roughly 1 pack per week, on and off for 10 years. No other recreational drugs. Drinks EtOH weekdays drinks maybe 2 drinks a day if at all. Binges on the weekends where he drinks 10+ drinks per night. Is sexually active with men. Partner is positive and does not use protection with him. Is monogamous.   Preventative care screenings Tetanus-2014 Heart stress test 2021  OBJECTIVE:   BP 118/72   Pulse 84   Wt 260 lb (117.9 kg)   SpO2 97%   BMI 35.26 kg/m   Physical Exam Constitutional:      General: He is not in acute distress.    Appearance: Normal appearance. He is obese.  HENT:     Head: Normocephalic and atraumatic.     Right Ear: Tympanic membrane, ear canal and external ear normal.     Left Ear: Tympanic membrane, ear canal and external ear normal.     Nose: Nose normal. No congestion.     Mouth/Throat:     Mouth: Mucous membranes are moist.  Eyes:     Extraocular Movements: Extraocular movements intact.     Pupils: Pupils are equal, round, and reactive to light.  Cardiovascular:     Rate and Rhythm: Normal rate and regular rhythm.     Pulses: Normal pulses.     Heart sounds: Normal heart sounds.  Pulmonary:     Effort: Pulmonary effort  is normal. No respiratory distress.     Breath sounds: Normal breath sounds. No wheezing.  Abdominal:     General: Abdomen is flat.     Palpations: Abdomen is soft.  Musculoskeletal:        General: Normal range of motion.     Cervical back: Normal range of motion and neck supple.  Skin:    General: Skin is warm and dry.     Capillary Refill: Capillary refill takes less than 2 seconds.     Findings: No lesion or rash.  Neurological:     General: No focal deficit present.     Mental Status: He is alert and oriented to person, place, and time. Mental status is at baseline.  Psychiatric:        Mood and Affect: Mood normal.        Behavior: Behavior normal.        Thought Content: Thought content normal.        Judgment: Judgment normal.      ASSESSMENT/PLAN:   Pure hypercholesterolemia Patient most recent cholesterol panel showed total cholesterol of 205 with LDL of 130.  He reports that they discussed starting a statin on him but he wanted to try dietary changes first.  10-year risk of CVD roughly 7.8% if he were 40 years  old. -Cholesterol panel collected today -We will do shared decision making regarding statin use  Essential hypertension Well-controlled at this time, patient will continue with current medications. -BMP today to evaluate kidney function  Alcohol abuse Patient is here.  He does not drink every night during the week and if he does he may have 2 drinks but on weekends he will binge 10+ alcoholic beverages per night.  He would like help with his cravings for alcohol.  We discussed naltrexone versus acamprosate. -Prescription for naltrexone sent to patient's pharmacy -Encourage decreasing consumption -Follow-up scheduled for 11/8     Derrel Nip, MD [facility]

## 2020-09-05 NOTE — Assessment & Plan Note (Signed)
Patient most recent cholesterol panel showed total cholesterol of 205 with LDL of 130.  He reports that they discussed starting a statin on him but he wanted to try dietary changes first.  10-year risk of CVD roughly 7.8% if he were 39 years old. -Cholesterol panel collected today -We will do shared decision making regarding statin use

## 2020-09-05 NOTE — Patient Instructions (Signed)
It was a pleasure to see you today!  I am going to collect some lab work today and I will call you with those results when they come back.  Your kidney function will determine the dose of the medication that I am going to send in that will help you decrease your drinking so I will wait for those results.  We are going to give you a flu shot today and I would like to see you in 2-3 weeks and we can discuss your alcohol use as well as you can receive your booster shot for COVID-19.  If you have any questions or concerns between now and then please call the clinic or send me a MyChart message.  I hope you have a wonderful afternoon!

## 2020-09-06 ENCOUNTER — Telehealth: Payer: Self-pay | Admitting: Family Medicine

## 2020-09-06 DIAGNOSIS — F1010 Alcohol abuse, uncomplicated: Secondary | ICD-10-CM | POA: Insufficient documentation

## 2020-09-06 LAB — BASIC METABOLIC PANEL
BUN/Creatinine Ratio: 15 (ref 9–20)
BUN: 15 mg/dL (ref 6–20)
CO2: 23 mmol/L (ref 20–29)
Calcium: 9.7 mg/dL (ref 8.7–10.2)
Chloride: 98 mmol/L (ref 96–106)
Creatinine, Ser: 1 mg/dL (ref 0.76–1.27)
GFR calc Af Amer: 109 mL/min/{1.73_m2} (ref >59)
GFR calc non Af Amer: 94 mL/min/{1.73_m2} (ref >59)
Glucose: 106 mg/dL — ABNORMAL HIGH (ref 65–99)
Potassium: 3.8 mmol/L (ref 3.5–5.2)
Sodium: 137 mmol/L (ref 134–144)

## 2020-09-06 LAB — LIPID PANEL
Chol/HDL Ratio: 4.2 ratio (ref 0.0–5.0)
Cholesterol, Total: 191 mg/dL (ref 100–199)
HDL: 45 mg/dL (ref >39)
LDL Chol Calc (NIH): 108 mg/dL — ABNORMAL HIGH (ref 0–99)
Triglycerides: 220 mg/dL — ABNORMAL HIGH (ref 0–149)
VLDL Cholesterol Cal: 38 mg/dL (ref 5–40)

## 2020-09-06 MED ORDER — NALTREXONE HCL 50 MG PO TABS
50.0000 mg | ORAL_TABLET | Freq: Every day | ORAL | 0 refills | Status: DC
Start: 2020-09-06 — End: 2022-03-26

## 2020-09-06 NOTE — Assessment & Plan Note (Signed)
Patient is here.  He does not drink every night during the week and if he does he may have 2 drinks but on weekends he will binge 10+ alcoholic beverages per night.  He would like help with his cravings for alcohol.  We discussed naltrexone versus acamprosate. -Prescription for naltrexone sent to patient's pharmacy -Encourage decreasing consumption -Follow-up scheduled for 11/8

## 2020-09-06 NOTE — Assessment & Plan Note (Signed)
Well-controlled at this time, patient will continue with current medications. -BMP today to evaluate kidney function

## 2020-09-06 NOTE — Telephone Encounter (Signed)
Called patient regarding his lab results.  His cholesterol is still elevated but improved from previous check and patient would like to continue dietary changes and hold off on statin use at this time.  Regarding alcohol abuse we had discussed a compensate versus naltrexone for the patient to help decrease his cravings.  We discussed that a compensate has to be taken 3 times a day versus naltrexone once a day and the patient would prefer to take the naltrexone once daily.  Prescription sent to patient's pharmacy.  We have scheduled follow-up on October 8 to discuss how it is going.

## 2020-10-02 ENCOUNTER — Ambulatory Visit: Payer: 59 | Admitting: Family Medicine

## 2020-12-04 ENCOUNTER — Ambulatory Visit (INDEPENDENT_AMBULATORY_CARE_PROVIDER_SITE_OTHER): Payer: Managed Care, Other (non HMO)

## 2020-12-04 ENCOUNTER — Other Ambulatory Visit: Payer: Self-pay

## 2020-12-04 DIAGNOSIS — Z23 Encounter for immunization: Secondary | ICD-10-CM | POA: Diagnosis not present

## 2020-12-04 NOTE — Progress Notes (Signed)
   Covid-19 Vaccination Clinic  Name:  Douglas [name]    MRN: [id number] DOB: 15-Oct-1981  12/04/2020  Douglas [name] was observed post Covid-19 immunization for 15 minutes without incident. He was provided with Vaccine Information Sheet and instruction to access the V-Safe system.   Douglas [name] was instructed to call 911 with any severe reactions post vaccine: Marland Kitchen Difficulty breathing  . Swelling of face and throat  . A fast heartbeat  . A bad rash all over body  . Dizziness and weakness   Booster administered RD without complication.

## 2021-01-01 ENCOUNTER — Other Ambulatory Visit: Payer: Self-pay

## 2021-01-01 ENCOUNTER — Other Ambulatory Visit (HOSPITAL_COMMUNITY)
Admission: RE | Admit: 2021-01-01 | Discharge: 2021-01-01 | Disposition: A | Discharge status: Home / Self Care | Payer: Managed Care, Other (non HMO) | Source: Ambulatory Visit | Attending: Internal Medicine | Admitting: Internal Medicine

## 2021-01-01 ENCOUNTER — Other Ambulatory Visit: Payer: Managed Care, Other (non HMO)

## 2021-01-01 DIAGNOSIS — Z113 Encounter for screening for infections with a predominantly sexual mode of transmission: Secondary | ICD-10-CM

## 2021-01-01 DIAGNOSIS — B20 Human immunodeficiency virus [HIV] disease: Secondary | ICD-10-CM

## 2021-01-02 LAB — T-HELPER CELL (CD4) - (RCID CLINIC ONLY)
CD4 % Helper T Cell: 31 % — ABNORMAL LOW (ref 33–65)
CD4 T Cell Abs: 531 /uL (ref 400–1790)

## 2021-01-02 LAB — URINE CYTOLOGY ANCILLARY ONLY
Chlamydia: NEGATIVE
Comment: NEGATIVE
Comment: NORMAL
Neisseria Gonorrhea: NEGATIVE

## 2021-01-04 LAB — CBC WITH DIFFERENTIAL/PLATELET
Absolute Monocytes: 949 cells/uL (ref 200–950)
Basophils Absolute: 33 cells/uL (ref 0–200)
Basophils Relative: 0.5 %
Eosinophils Absolute: 189 cells/uL (ref 15–500)
Eosinophils Relative: 2.9 %
HCT: 47.9 % (ref 38.5–50.0)
Hemoglobin: 16.4 g/dL (ref 13.2–17.1)
Lymphs Abs: 1859 cells/uL (ref 850–3900)
MCH: 29.2 pg (ref 27.0–33.0)
MCHC: 34.2 g/dL (ref 32.0–36.0)
MCV: 85.2 fL (ref 80.0–100.0)
MPV: 10.9 fL (ref 7.5–12.5)
Monocytes Relative: 14.6 %
Neutro Abs: 3471 cells/uL (ref 1500–7800)
Neutrophils Relative %: 53.4 %
Platelets: 253 10*3/uL (ref 140–400)
RBC: 5.62 10*6/uL (ref 4.20–5.80)
RDW: 13.3 % (ref 11.0–15.0)
Total Lymphocyte: 28.6 %
WBC: 6.5 10*3/uL (ref 3.8–10.8)

## 2021-01-04 LAB — COMPLETE METABOLIC PANEL WITH GFR
AG Ratio: 1.5 (calc) (ref 1.0–2.5)
ALT: 29 U/L (ref 9–46)
AST: 15 U/L (ref 10–40)
Albumin: 4.7 g/dL (ref 3.6–5.1)
Alkaline phosphatase (APISO): 70 U/L (ref 36–130)
BUN: 14 mg/dL (ref 7–25)
CO2: 27 mmol/L (ref 20–32)
Calcium: 9.9 mg/dL (ref 8.6–10.3)
Chloride: 101 mmol/L (ref 98–110)
Creat: 1.08 mg/dL (ref 0.60–1.35)
GFR, Est African American: 100 mL/min/{1.73_m2} (ref >=60)
GFR, Est Non African American: 86 mL/min/{1.73_m2} (ref >=60)
Globulin: 3.2 g/dL (calc) (ref 1.9–3.7)
Glucose, Bld: 95 mg/dL (ref 65–99)
Potassium: 4 mmol/L (ref 3.5–5.3)
Sodium: 139 mmol/L (ref 135–146)
Total Bilirubin: 0.5 mg/dL (ref 0.2–1.2)
Total Protein: 7.9 g/dL (ref 6.1–8.1)

## 2021-01-04 LAB — HIV-1 RNA QUANT-NO REFLEX-BLD
HIV 1 RNA Quant: <20 Copies/mL
HIV-1 RNA Quant, Log: <1.3 Log cps/mL

## 2021-01-18 ENCOUNTER — Encounter: Payer: Self-pay | Admitting: Internal Medicine

## 2021-01-18 ENCOUNTER — Ambulatory Visit: Payer: Managed Care, Other (non HMO) | Admitting: Internal Medicine

## 2021-01-18 ENCOUNTER — Other Ambulatory Visit: Payer: Self-pay

## 2021-01-18 DIAGNOSIS — B20 Human immunodeficiency virus [HIV] disease: Secondary | ICD-10-CM

## 2021-01-18 DIAGNOSIS — Z720 Tobacco use: Secondary | ICD-10-CM

## 2021-01-18 DIAGNOSIS — F1010 Alcohol abuse, uncomplicated: Secondary | ICD-10-CM

## 2021-01-18 DIAGNOSIS — F419 Anxiety disorder, unspecified: Secondary | ICD-10-CM | POA: Diagnosis not present

## 2021-01-18 MED ORDER — BICTEGRAVIR-EMTRICITAB-TENOFOV 50-200-25 MG PO TABS: 1.0000 | ORAL_TABLET | Freq: Every day | ORAL | 11 refills | Status: DC

## 2021-01-18 NOTE — Assessment & Plan Note (Signed)
His infection remains under excellent, long-term control.  He will continue Biktarvy and follow-up after lab work in 6 months.  We have given him a pillbox to use.

## 2021-01-18 NOTE — Assessment & Plan Note (Signed)
I encouraged him to follow-up with his PCP plan be for his binge drinking.

## 2021-01-18 NOTE — Progress Notes (Signed)
Patient Active Problem List   Diagnosis Date Noted  . Human immunodeficiency virus (HIV) disease (HCC) 06/25/2013    Priority: High  . Anxiety 01/18/2021  . Alcohol abuse 09/06/2020  . Tobacco abuse 09/05/2020  . Blood in semen 07/28/2020  . Screen for STD (sexually transmitted disease) 07/28/2020  . Screening for prostate cancer 07/28/2020  . Family history of heart disease 06/22/2019  . Pure hypercholesterolemia 06/22/2019  . Heart palpitations 06/07/2019  . Family history of premature CAD 04/14/2019  . Atypical chest pain 09/18/2018  . Obesity 01/01/2018  . Screening examination for venereal disease 07/27/2014  . Screening for condition 07/27/2014  . Seborrheic dermatitis 08/19/2013  . Essential hypertension 06/25/2013    Patient's Medications  New Prescriptions   No medications on file  Previous Medications   LOSARTAN-HYDROCHLOROTHIAZIDE (HYZAAR) 100-25 MG TABLET    Take 1 tablet by mouth daily.   METOPROLOL TARTRATE (LOPRESSOR) 100 MG TABLET    TAKE 1 TABLET 2 HR PRIOR TO CARDIAC PROCEDURE   NALTREXONE (DEPADE) 50 MG TABLET    Take 1 tablet (50 mg total) by mouth daily.   SULFAMETHOXAZOLE-TRIMETHOPRIM (BACTRIM DS) 800-160 MG TABLET    Take 1 tablet by mouth 2 (two) times daily.  Modified Medications   Modified Medication Previous Medication   BICTEGRAVIR-EMTRICITABINE-TENOFOVIR AF (BIKTARVY) 50-200-25 MG TABS TABLET bictegravir-emtricitabine-tenofovir AF (BIKTARVY) 50-200-25 MG TABS tablet      Take 1 tablet by mouth daily.    Take 1 tablet by mouth daily.  Discontinued Medications   No medications on file    Subjective: Douglas [name] is in for his routine HIV follow-up visit.  He has not had any problems obtaining, taking or tolerating his Biktarvy.  He states that he misses doses occasionally.  This tends to occur when his routine schedule changes.  A few occasions he thinks he may have doubled up on his dose when he could not recall if he took it in the  morning.  He states that he was extremely anxious during the first part of the Covid pandemic but he is feeling a little bit better now.  He continues to work full-time and care for his mother.  Both he and his mother are fully vaccinated against COVID.  He has had his influenza vaccine.  He continues to smoke cigarettes.  He is still drinking alcohol heavily on weekends.  He says that he will often have 5 shots of tequila and 2 bottles of wine in 1 sitting.  He tried taking naltrexone but says that he did not tolerate it and stopped it after 1 week.  He denies feeling depressed.  Review of Systems: Review of Systems  Constitutional: Negative for chills, diaphoresis, fever, malaise/fatigue and weight loss.  HENT: Negative for sore throat.   Respiratory: Negative for cough, sputum production and shortness of breath.   Cardiovascular: Negative for chest pain.  Gastrointestinal: Negative for abdominal pain, diarrhea, heartburn, nausea and vomiting.  Genitourinary: Negative for dysuria and frequency.  Musculoskeletal: Negative for joint pain and myalgias.  Skin: Negative for rash.  Neurological: Negative for dizziness and headaches.  Psychiatric/Behavioral: Negative for depression. The patient is nervous/anxious.     Past Medical History:  Diagnosis Date  . Family history of heart disease in male family member before age 4   . HIV infection (HCC) 2014  . Hypertension 2004  . Low HDL (under 40)   . Seasonal allergic rhinitis    weather changes, spring/fall  .  Sickle cell trait (HCC)     Social History   Tobacco Use  . Smoking status: Current Every Day Smoker    Packs/day: 0.50    Start date: 09/03/2013    Last attempt to quit: 11/14/2017    Years since quitting: 3.1  . Smokeless tobacco: Never Used  . 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 Vaping Use: Never used  Substance Use Topics  . Alcohol use: Yes    Alcohol/week: 15.0 standard drinks    Types: 15 Glasses of  wine per week  . Drug use: No    Family History  Problem Relation Age of Onset  . Hypertension Mother   . Stroke Mother   . Heart disease Father 40       died of MI  . Diabetes Father   . Hypertension Sister   . Diabetes Sister        type 2  . Heart disease Paternal Uncle        several uncles died in 54s with MI  . Hypertension Paternal Uncle   . Hypertension Maternal Grandmother   . Stroke Maternal Grandmother   . Hypertension Maternal Grandfather   . Cancer Paternal Grandmother        lung  . Heart disease Paternal Uncle        MI in 71s    Allergies  Allergen Reactions  . Shellfish Allergy Anaphylaxis    Health Maintenance  Topic Date Due  . COVID-19 Vaccine (2 - Pfizer risk 4-dose series) 12/25/2020  . TETANUS/TDAP  06/10/2023  . INFLUENZA VACCINE  Completed  . Hepatitis C Screening  Completed  . HIV Screening  Completed    Objective:  Vitals:   01/18/21 0857  BP: 132/88  Pulse: 85  Weight: 264 lb (119.7 kg)   Body mass index is 35.8 kg/m.  Physical Exam Constitutional:      Comments: He is calm and in good spirits.  Cardiovascular:     Rate and Rhythm: Normal rate and regular rhythm.     Heart sounds: No murmur heard.   Pulmonary:     Effort: Pulmonary effort is normal.     Breath sounds: Normal breath sounds.  Psychiatric:        Mood and Affect: Mood normal.     Lab Results Lab Results  Component Value Date   WBC 6.5 01/01/2021   HGB 16.4 01/01/2021   HCT 47.9 01/01/2021   MCV 85.2 01/01/2021   PLT 253 01/01/2021    Lab Results  Component Value Date   CREATININE 1.08 01/01/2021   BUN 14 01/01/2021   NA 139 01/01/2021   K 4.0 01/01/2021   CL 101 01/01/2021   CO2 27 01/01/2021    Lab Results  Component Value Date   ALT 29 01/01/2021   AST 15 01/01/2021   ALKPHOS 52 09/15/2018   BILITOT 0.5 01/01/2021    Lab Results  Component Value Date   CHOL 191 09/05/2020   HDL 45 09/05/2020   LDLCALC 108 (H) 09/05/2020   TRIG  220 (H) 09/05/2020   CHOLHDL 4.2 09/05/2020   Lab Results  Component Value Date   LABRPR Non Reactive 07/28/2020   RPRTITER 1:1 06/17/2013   HIV 1 RNA Quant  Date Value  01/01/2021 <20 Copies/mL  12/16/2019 <20 NOT DETECTED copies/mL  12/16/2018 <20 NOT DETECTED copies/mL   CD4 T Cell Abs (/uL)  Date Value  01/01/2021 531  12/16/2019 507  12/16/2018  590     Problem List Items Addressed This Visit      High   Human immunodeficiency virus (HIV) disease (HCC) (Chronic)    His infection remains under excellent, long-term control.  He will continue Biktarvy and follow-up after lab work in 6 months.  We have given him a pillbox to use.      Relevant Medications   bictegravir-emtricitabine-tenofovir AF (BIKTARVY) 50-200-25 MG TABS tablet   Other Relevant Orders   T-helper cell (CD4)- ([facility] clinic only)   HIV-1 RNA quant-no reflex-bld     Unprioritized   Tobacco abuse    He is currently not ready to quit smoking but I encouraged him to think about the benefits of cutting down and eventually quitting.      Alcohol abuse    I encouraged him to follow-up with his PCP plan be for his binge drinking.      Anxiety    He has recently had an acute exacerbation of his anxiety but is feeling somewhat better since getting his Covid booster vaccine. He can see some correlation between his alcohol consumption and his anxiety.  In the short-term it makes him more calm when he is drinking but in the long it may exacerbate his anxiety           [name], MD [facility] [phone] pager   [phone] cell 01/18/2021, 9:18 AM

## 2021-01-18 NOTE — Assessment & Plan Note (Signed)
He is currently not ready to quit smoking but I encouraged him to think about the benefits of cutting down and eventually quitting.

## 2021-01-18 NOTE — Assessment & Plan Note (Signed)
He has recently had an acute exacerbation of his anxiety but is feeling somewhat better since getting his Covid booster vaccine. He can see some correlation between his alcohol consumption and his anxiety.  In the short-term it makes him more calm when he is drinking but in the long it may exacerbate his anxiety

## 2021-02-05 ENCOUNTER — Encounter: Payer: Self-pay | Admitting: Family Medicine

## 2021-02-05 NOTE — Telephone Encounter (Signed)
Called patient to discuss restarting medication but he did not answer. If he is having issues with nausea I recommend he take it with food. If he continues to feel strange we could consider decreasing dose to 25 mg daily and titrating up.

## 2021-02-15 ENCOUNTER — Encounter: Payer: Self-pay | Admitting: Family Medicine

## 2021-02-15 ENCOUNTER — Other Ambulatory Visit: Payer: Self-pay | Admitting: Medical

## 2021-02-15 DIAGNOSIS — I10 Essential (primary) hypertension: Secondary | ICD-10-CM

## 2021-02-16 MED ORDER — LOSARTAN POTASSIUM-HCTZ 100-25 MG PO TABS: 1.0000 | ORAL_TABLET | Freq: Every day | ORAL | 3 refills | Status: DC

## 2021-02-21 ENCOUNTER — Other Ambulatory Visit: Payer: Self-pay

## 2021-02-21 DIAGNOSIS — B20 Human immunodeficiency virus [HIV] disease: Secondary | ICD-10-CM

## 2021-02-21 MED ORDER — BICTEGRAVIR-EMTRICITAB-TENOFOV 50-200-25 MG PO TABS: 1.0000 | ORAL_TABLET | Freq: Every day | ORAL | 11 refills | Status: DC

## 2021-03-26 ENCOUNTER — Other Ambulatory Visit: Payer: Self-pay

## 2021-03-26 DIAGNOSIS — B20 Human immunodeficiency virus [HIV] disease: Secondary | ICD-10-CM

## 2021-03-26 MED ORDER — BICTEGRAVIR-EMTRICITAB-TENOFOV 50-200-25 MG PO TABS: 1.0000 | ORAL_TABLET | Freq: Every day | ORAL | 11 refills | Status: DC

## 2021-03-27 ENCOUNTER — Other Ambulatory Visit: Payer: Self-pay

## 2021-03-27 DIAGNOSIS — B20 Human immunodeficiency virus [HIV] disease: Secondary | ICD-10-CM

## 2021-03-27 MED ORDER — BICTEGRAVIR-EMTRICITAB-TENOFOV 50-200-25 MG PO TABS: 1.0000 | ORAL_TABLET | Freq: Every day | ORAL | 6 refills | Status: DC

## 2021-03-27 MED ORDER — BICTEGRAVIR-EMTRICITAB-TENOFOV 50-200-25 MG PO TABS: 1.0000 | ORAL_TABLET | Freq: Every day | ORAL | 11 refills | Status: DC

## 2021-04-13 ENCOUNTER — Ambulatory Visit: Payer: Managed Care, Other (non HMO)

## 2021-04-17 ENCOUNTER — Encounter: Payer: Self-pay | Admitting: Family Medicine

## 2021-05-07 ENCOUNTER — Encounter: Payer: Managed Care, Other (non HMO) | Admitting: Family Medicine

## 2021-06-04 ENCOUNTER — Encounter: Payer: Self-pay | Admitting: Internal Medicine

## 2021-06-04 ENCOUNTER — Other Ambulatory Visit: Payer: Managed Care, Other (non HMO)

## 2021-06-04 ENCOUNTER — Other Ambulatory Visit: Payer: Self-pay

## 2021-06-04 ENCOUNTER — Ambulatory Visit (INDEPENDENT_AMBULATORY_CARE_PROVIDER_SITE_OTHER): Payer: 59 | Admitting: Internal Medicine

## 2021-06-04 ENCOUNTER — Other Ambulatory Visit (HOSPITAL_COMMUNITY)
Admission: RE | Admit: 2021-06-04 | Discharge: 2021-06-04 | Disposition: A | Discharge status: Home / Self Care | Payer: 59 | Source: Ambulatory Visit | Attending: Internal Medicine | Admitting: Internal Medicine

## 2021-06-04 VITALS — BP 136/83 | HR 69 | Temp 98.3°F | Wt 259.0 lb

## 2021-06-04 DIAGNOSIS — Z720 Tobacco use: Secondary | ICD-10-CM | POA: Diagnosis not present

## 2021-06-04 DIAGNOSIS — B20 Human immunodeficiency virus [HIV] disease: Secondary | ICD-10-CM

## 2021-06-04 DIAGNOSIS — L299 Pruritus, unspecified: Secondary | ICD-10-CM | POA: Diagnosis not present

## 2021-06-04 DIAGNOSIS — Z113 Encounter for screening for infections with a predominantly sexual mode of transmission: Secondary | ICD-10-CM | POA: Diagnosis present

## 2021-06-04 DIAGNOSIS — E7800 Pure hypercholesterolemia, unspecified: Secondary | ICD-10-CM

## 2021-06-04 NOTE — Assessment & Plan Note (Signed)
Discussed cessation and he is hopeful to quit.  He understands his risk with his significant family history makes his risk higher.  I encouraged continued efforts at cessation.

## 2021-06-04 NOTE — Assessment & Plan Note (Signed)
Will screen 

## 2021-06-04 NOTE — Assessment & Plan Note (Signed)
He is doing better with compliance and having no issues with the medication.  No changes and will do labs today and follow up in 1 year.

## 2021-06-04 NOTE — Progress Notes (Signed)
   Subjective:    Patient ID: Douglas [name], male    DOB: [birth year], 40 y.o.   MRN: [id number]  HPI He is here for follow up of HIV He was rescheduled from Dr. Orvan Falconer due to his work schedule and has not had labs prior to the visit. He reports a 99% compliance with his medications, which is improved from previous.  He also is trying to reduce his smoking and binge drinking and is getting better by his report.  He uses condoms with all sexual activity.   He has noted itching of his scrotal area.  No rash, no lesions or penile discharge.  Feels it is from a lot of recent sweating.    Review of Systems  Constitutional:  Negative for fatigue.  Gastrointestinal:  Negative for diarrhea.  Skin:  Negative for rash.      Objective:   Physical Exam Eyes:     General: No scleral icterus. Pulmonary:     Effort: Pulmonary effort is normal.  Neurological:     General: No focal deficit present.     Mental Status: He is alert.  Psychiatric:        Mood and Affect: Mood normal.   SH: + tobacco       Assessment & Plan:

## 2021-06-04 NOTE — Assessment & Plan Note (Addendum)
New problem.  Most c/w an issue with moisture.  Will try powder, keeping area dry.  He will call if he notes any rash or other new issues.

## 2021-06-05 LAB — URINE CYTOLOGY ANCILLARY ONLY
Chlamydia: NEGATIVE
Comment: NEGATIVE
Comment: NORMAL
Neisseria Gonorrhea: NEGATIVE

## 2021-06-05 LAB — T-HELPER CELL (CD4) - (RCID CLINIC ONLY)
CD4 % Helper T Cell: 45 % (ref 33–65)
CD4 T Cell Abs: 578 /uL (ref 400–1790)

## 2021-06-06 LAB — RPR: RPR Ser Ql: NONREACTIVE

## 2021-06-06 LAB — HIV-1 RNA QUANT-NO REFLEX-BLD
HIV 1 RNA Quant: <20 Copies/mL — ABNORMAL HIGH
HIV-1 RNA Quant, Log: <1.3 Log cps/mL — ABNORMAL HIGH

## 2021-06-26 ENCOUNTER — Other Ambulatory Visit: Payer: Managed Care, Other (non HMO)

## 2021-07-10 ENCOUNTER — Encounter: Payer: Managed Care, Other (non HMO) | Admitting: Internal Medicine

## 2021-07-20 ENCOUNTER — Ambulatory Visit (INDEPENDENT_AMBULATORY_CARE_PROVIDER_SITE_OTHER): Payer: No Typology Code available for payment source | Admitting: Family Medicine

## 2021-07-20 ENCOUNTER — Encounter: Payer: Self-pay | Admitting: Family Medicine

## 2021-07-20 ENCOUNTER — Other Ambulatory Visit: Payer: Self-pay

## 2021-07-20 VITALS — BP 132/82 | HR 85 | Ht 72.0 in | Wt 259.6 lb

## 2021-07-20 DIAGNOSIS — Z23 Encounter for immunization: Secondary | ICD-10-CM | POA: Diagnosis not present

## 2021-07-20 DIAGNOSIS — E7800 Pure hypercholesterolemia, unspecified: Secondary | ICD-10-CM

## 2021-07-20 DIAGNOSIS — Z720 Tobacco use: Secondary | ICD-10-CM | POA: Diagnosis not present

## 2021-07-20 DIAGNOSIS — I10 Essential (primary) hypertension: Secondary | ICD-10-CM

## 2021-07-20 NOTE — Assessment & Plan Note (Signed)
Well-controlled at this time.  Patient will continue on current medication management.  Does not need BMP at this time but will need 1 in February 2023.

## 2021-07-20 NOTE — Patient Instructions (Signed)
It was a pleasure seeing you today.  I am glad you have no concerns at this time.  We are going to give you the pneumonia vaccine today and please be sure to get the COVID booster when it comes out.  We will wait to get lab work until your next visit.  Continue to work on how much alcohol you are consuming as well as your smoking.  If you want any further help please let us know.  If you have any questions or concerns call the clinic.  I hope you have a wonderful afternoon!

## 2021-07-20 NOTE — Addendum Note (Signed)
Addended by: Lamonte Sakai, Orey Moure D on: 07/20/2021 03:59 PM   Modules accepted: Orders

## 2021-07-20 NOTE — Assessment & Plan Note (Signed)
Patient with elevated triglycerides and LDL most recent lipid panel.  It has not been a year so we will hold off on collecting at this time.  We will recheck cholesterol at next visit and discuss initiation of a statin.

## 2021-07-20 NOTE — Assessment & Plan Note (Signed)
Discussed cessation today and patient reports he will continue to work on this.  Reports that he has cut way back tobacco consumption is smoking 1 to 2 packs/week and only smokes on the weekends.  We will continue to encourage these efforts.

## 2021-07-20 NOTE — Progress Notes (Signed)
    SUBJECTIVE:   CHIEF COMPLAINT / HPI:   Physical  Patient reports he is doing well and has no major concerns at this time.  He reports that he has been following with infectious disease and they have been monitoring and managing his HIV.  We discussed his tobacco use and he reports that he has cut down greatly.  He is now smoking 1 to 2 packs/week, smoking only on the weekends.  He also reports that he has cut down his alcohol consumption greatly.  He tried the naloxone that we prescribed but he did not like how it made him feel so he stopped.  He is no longer drinking every day and only drinks on the weekend.  He does report that he may drink a whole bottle of wine a night and will occasionally drink liquor and will have 3-4 drinks in a night.  Is going to continue to work on moderation.  Podiatry recent illnesses.  PERTINENT  PMH / PSH: HIV  OBJECTIVE:   BP 132/82   Pulse 85   Ht 6' (1.829 m)   Wt 259 lb 9.6 oz (117.8 kg)   SpO2 97%   BMI 35.21 kg/m   General: Well-appearing, no acute distress, sitting comfortably in chair next to exam table HEENT: Moist mucous membranes, no posterior oropharynx erythema or nasal congestion Cardiac: Regular rate and rhythm, no murmurs appreciated Respiratory: Normal for breathing, lungs clear to auscultation bilaterally Abdomen: Soft, nontender, positive bowel sounds MSK: No gross abnormalities  ASSESSMENT/PLAN:   Essential hypertension Well-controlled at this time.  Patient will continue on current medication management.  Does not need BMP at this time but will need 1 in February 2023.  Tobacco abuse Discussed cessation today and patient reports he will continue to work on this.  Reports that he has cut way back tobacco consumption is smoking 1 to 2 packs/week and only smokes on the weekends.  We will continue to encourage these efforts.  Pure hypercholesterolemia Patient with elevated triglycerides and LDL most recent lipid panel.  It has  not been a year so we will hold off on collecting at this time.  We will recheck cholesterol at next visit and discuss initiation of a statin.     Derrel Nip, MD [facility]

## 2021-07-30 DIAGNOSIS — H5213 Myopia, bilateral: Secondary | ICD-10-CM | POA: Diagnosis not present

## 2021-08-15 IMAGING — CR DG CHEST 2V
2 series · 2 of 2 positions shown · non-contrast
Comparison: Remote radiograph 08/19/2007

CLINICAL DATA: Awoke with chest pain.

EXAM:
CHEST - 2 VIEW

[chest pa]
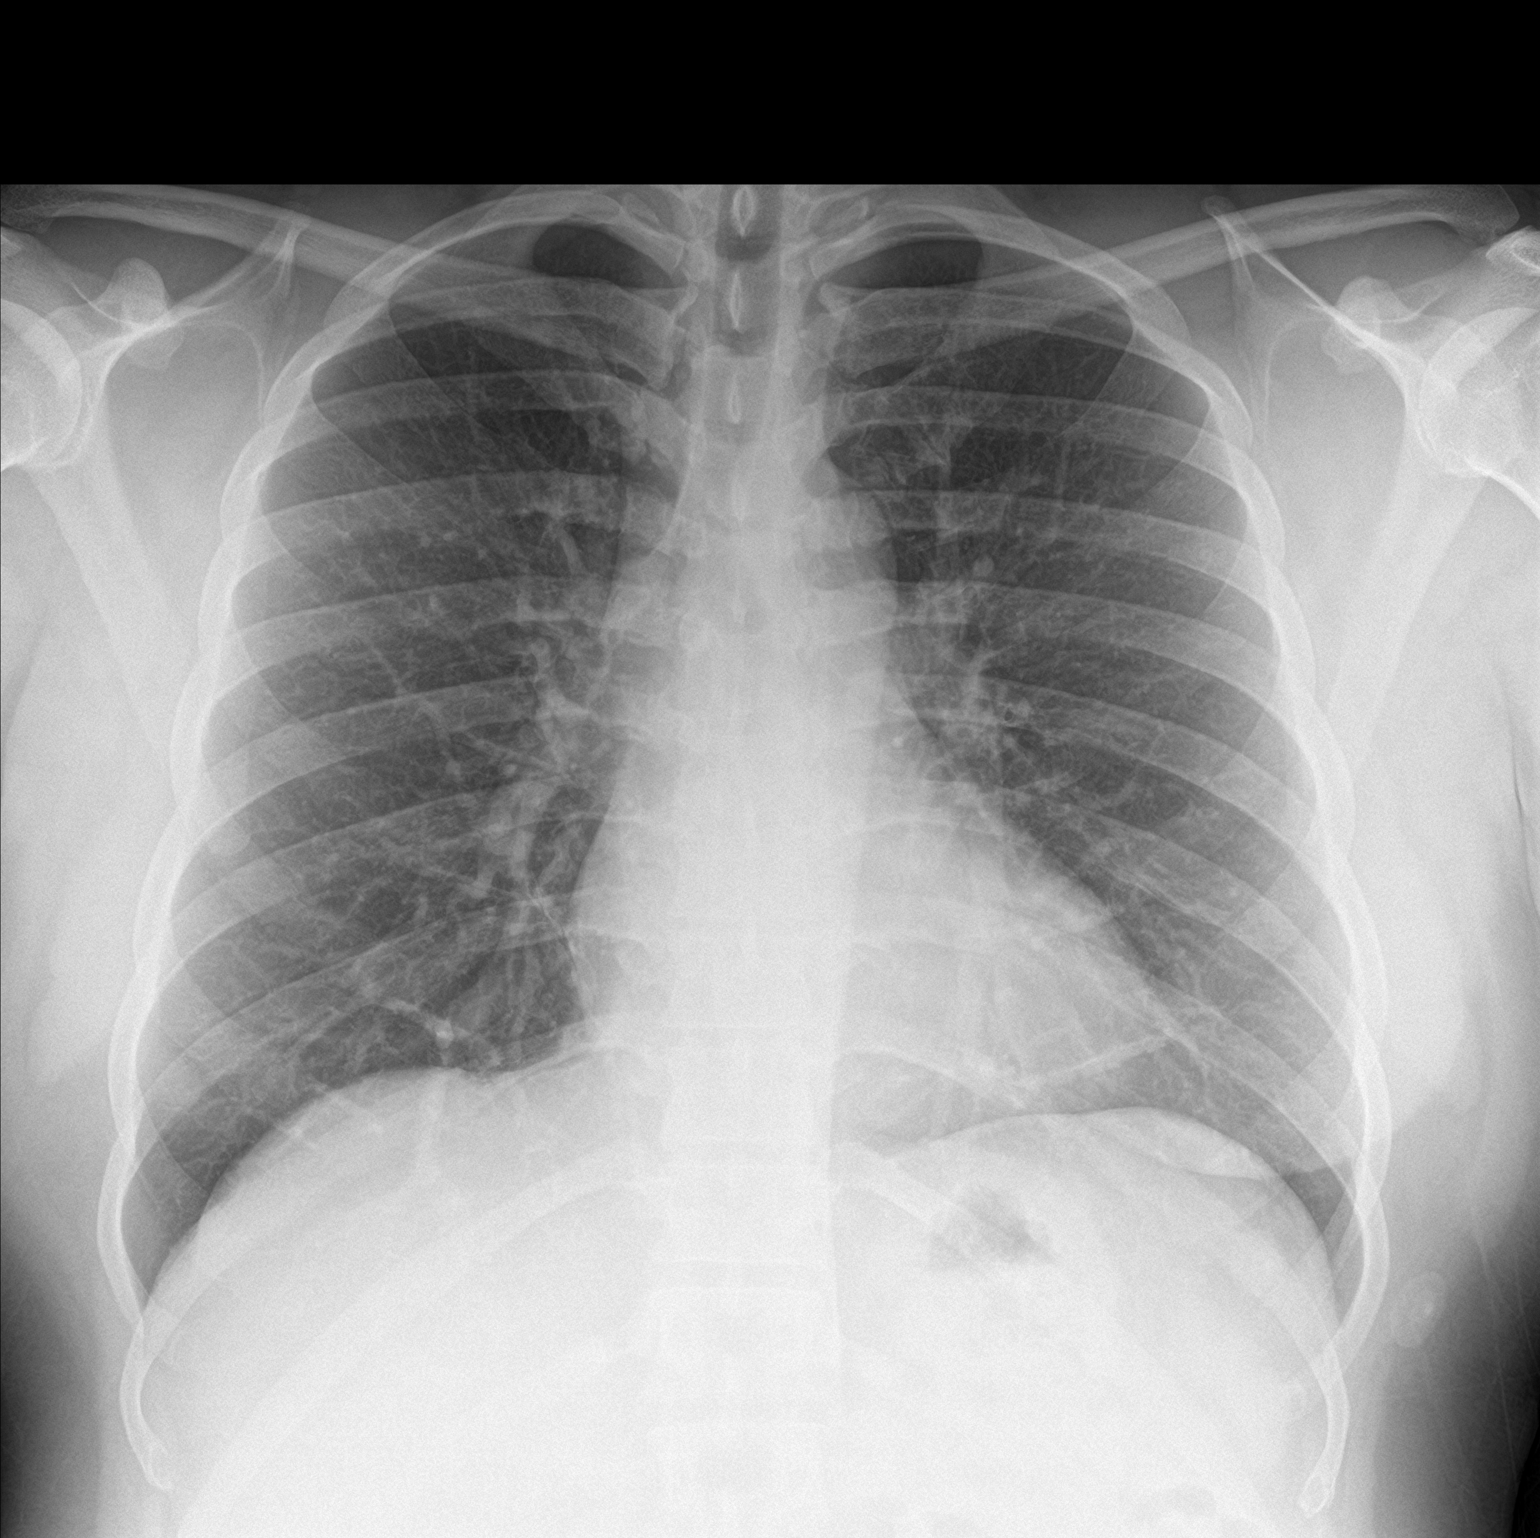

[chest lat]
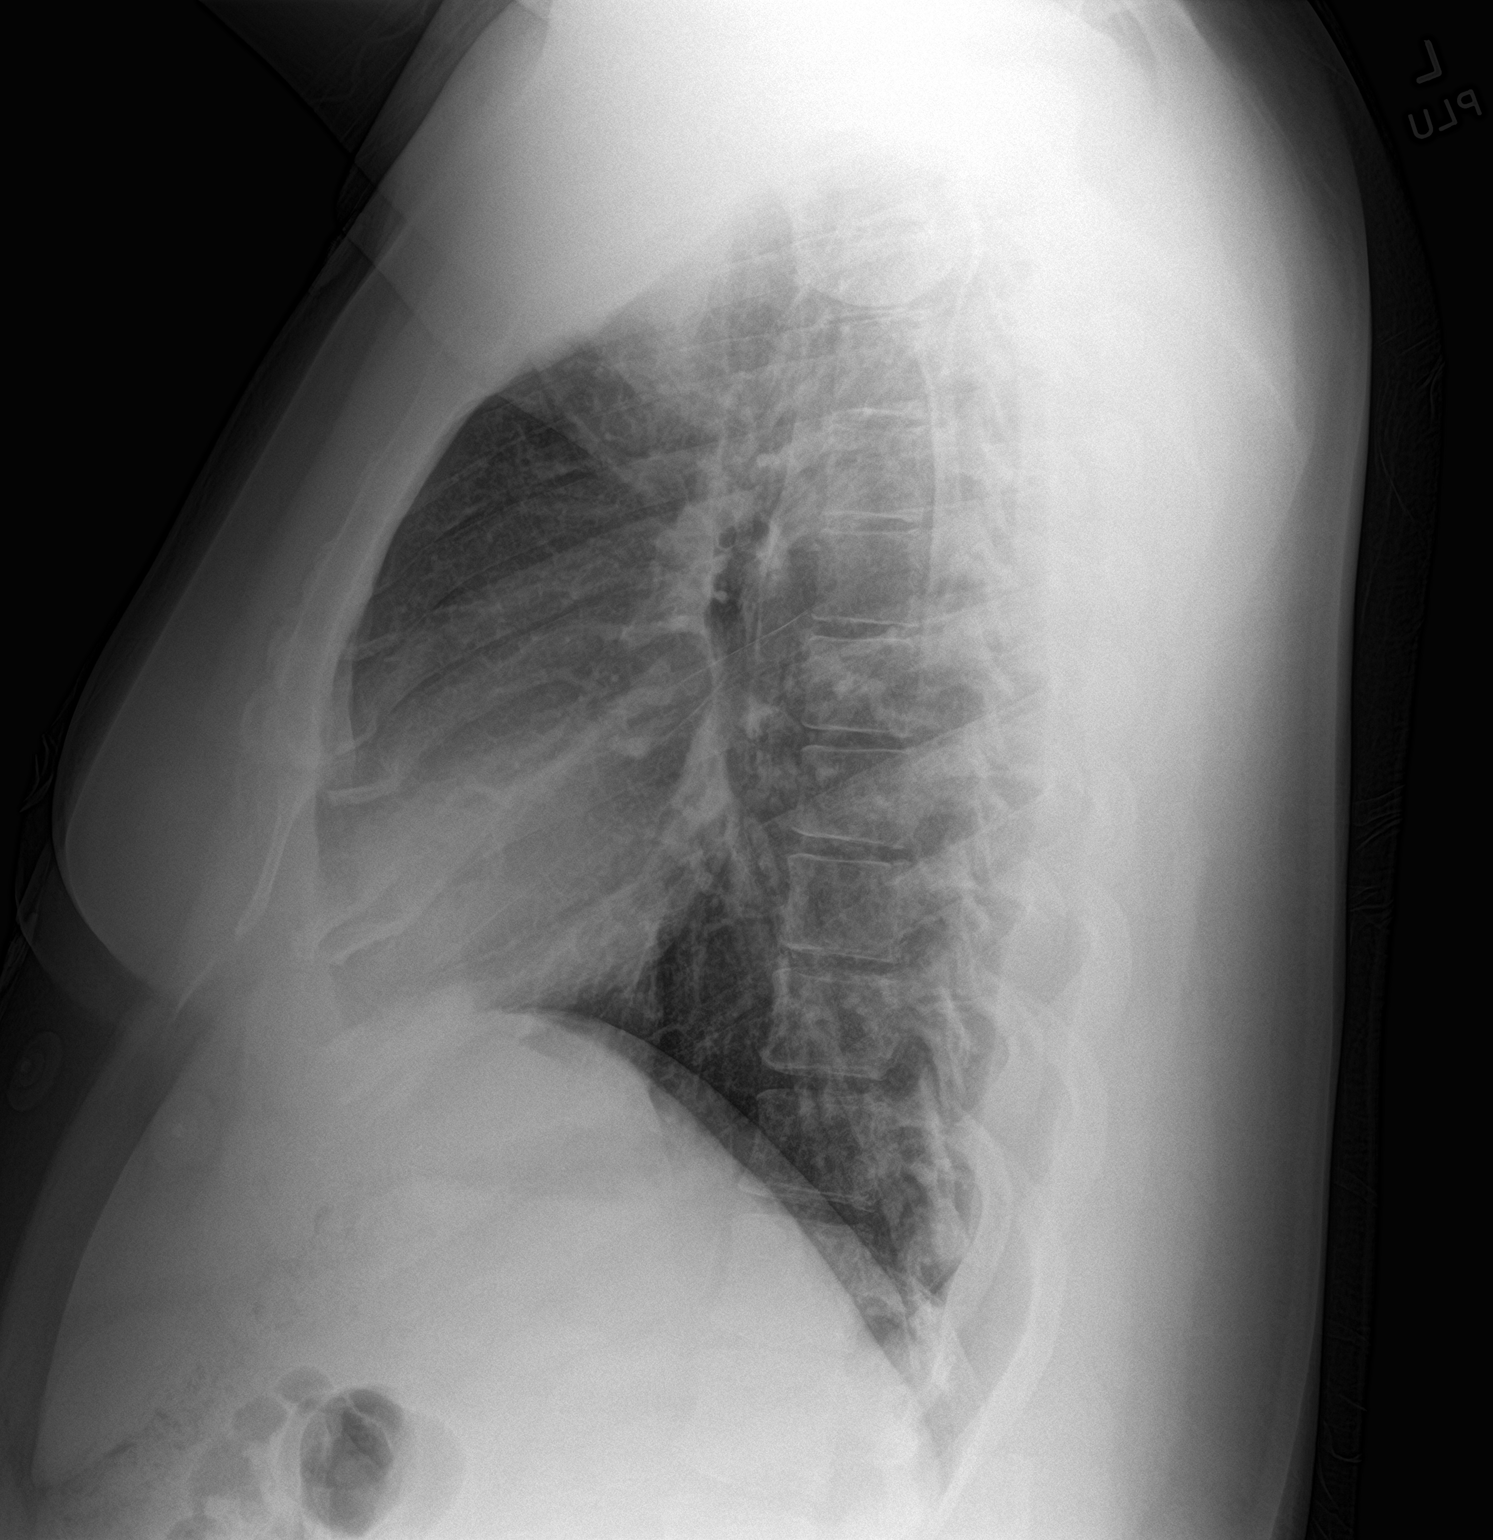

[2 of 2 positions shown; findings below may reference images not displayed]

FINDINGS: The cardiomediastinal contours are normal. The lungs are clear.
Pulmonary vasculature is normal. No consolidation, pleural effusion,
or pneumothorax. No acute osseous abnormalities are seen.
IMPRESSION: Negative radiographs of the chest.

## 2021-08-31 ENCOUNTER — Other Ambulatory Visit: Payer: Self-pay

## 2021-08-31 DIAGNOSIS — B20 Human immunodeficiency virus [HIV] disease: Secondary | ICD-10-CM

## 2021-08-31 MED ORDER — BICTEGRAVIR-EMTRICITAB-TENOFOV 50-200-25 MG PO TABS: 1.0000 | ORAL_TABLET | Freq: Every day | ORAL | 6 refills | Status: DC

## 2021-09-04 ENCOUNTER — Other Ambulatory Visit: Payer: Self-pay

## 2021-09-04 DIAGNOSIS — B20 Human immunodeficiency virus [HIV] disease: Secondary | ICD-10-CM

## 2021-09-04 MED ORDER — BICTEGRAVIR-EMTRICITAB-TENOFOV 50-200-25 MG PO TABS: 1.0000 | ORAL_TABLET | Freq: Every day | ORAL | 6 refills | Status: DC

## 2021-09-16 ENCOUNTER — Encounter: Payer: Self-pay | Admitting: Family Medicine

## 2021-09-17 DIAGNOSIS — Z13220 Encounter for screening for lipoid disorders: Secondary | ICD-10-CM | POA: Diagnosis not present

## 2021-09-28 DIAGNOSIS — R69 Illness, unspecified: Secondary | ICD-10-CM | POA: Diagnosis not present

## 2021-09-28 DIAGNOSIS — Z114 Encounter for screening for human immunodeficiency virus [HIV]: Secondary | ICD-10-CM | POA: Diagnosis not present

## 2021-10-12 ENCOUNTER — Ambulatory Visit: Payer: Managed Care, Other (non HMO) | Admitting: Family Medicine

## 2021-10-13 IMAGING — DX DG FINGER RING 2+V*L*
3 series · 3 of 3 positions shown · non-contrast
Comparison: None.

CLINICAL DATA: Worsening pain, no specific injury, felt shifting
sensation and pain while doing laundry

EXAM:
LEFT RING FINGER 2+V

[finger pa (1 of 2)]
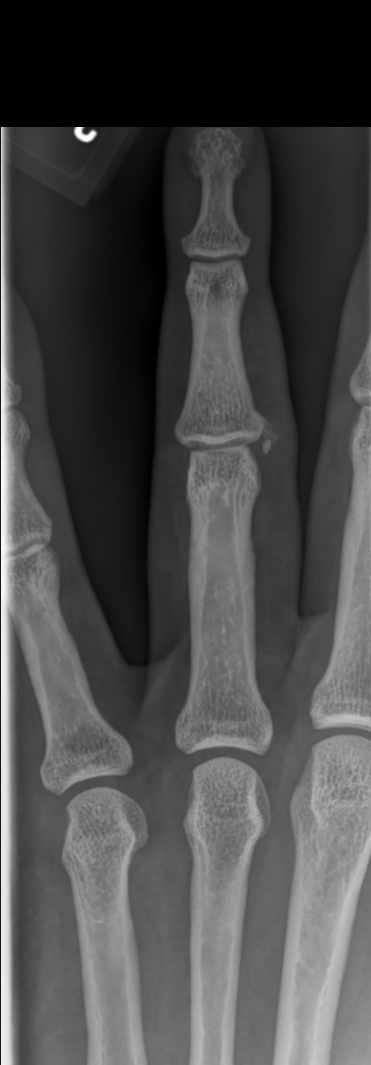

[finger pa (2 of 2)]
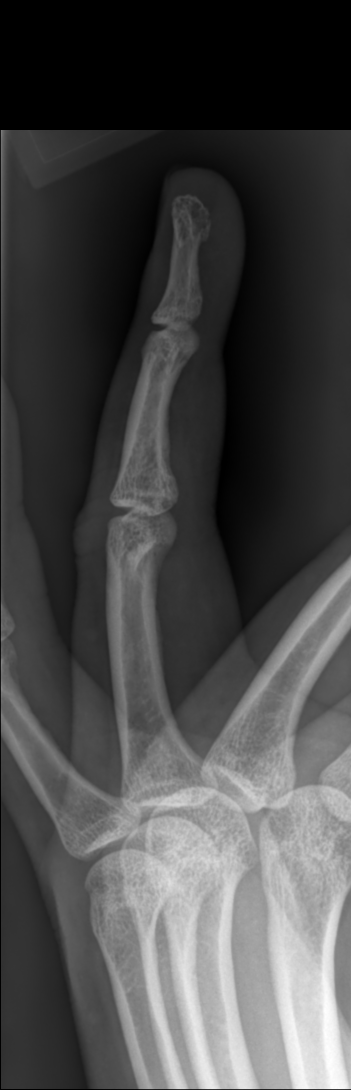

[finger lat]
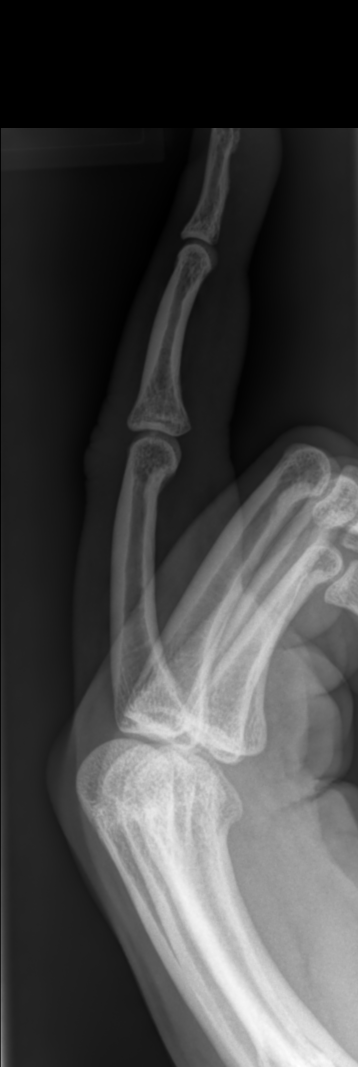

[3 of 3 positions shown; findings below may reference images not displayed]

FINDINGS: No acute fracture or dislocation of the left ring digit. There are
corticated appearing fragments adjacent to the lateral aspect of the
base of the third middle phalanx. Joint spaces are preserved. Soft
tissues are unremarkable.
IMPRESSION: 1. No acute fracture or dislocation of the left ring digit.
2. There are corticated appearing fragments adjacent to the lateral
(thenar) aspect of the base of the third middle phalanx, consistent
with sequelae of prior trauma. There may be instability of the joint
related to this finding. Correlate with physical examination
findings. Nonemergent MRI may be used to further evaluate integrity
of the tendinous and ligamentous structures of the digit if
clinically indicated.

## 2021-11-26 ENCOUNTER — Encounter (INDEPENDENT_AMBULATORY_CARE_PROVIDER_SITE_OTHER): Payer: Self-pay

## 2021-12-14 ENCOUNTER — Ambulatory Visit: Payer: 59 | Admitting: Nurse Practitioner

## 2022-02-18 DIAGNOSIS — Z87898 Personal history of other specified conditions: Secondary | ICD-10-CM | POA: Diagnosis not present

## 2022-02-18 DIAGNOSIS — Z131 Encounter for screening for diabetes mellitus: Secondary | ICD-10-CM | POA: Diagnosis not present

## 2022-02-18 DIAGNOSIS — I10 Essential (primary) hypertension: Secondary | ICD-10-CM | POA: Diagnosis not present

## 2022-02-18 DIAGNOSIS — R143 Flatulence: Secondary | ICD-10-CM | POA: Diagnosis not present

## 2022-02-18 DIAGNOSIS — Z1159 Encounter for screening for other viral diseases: Secondary | ICD-10-CM | POA: Diagnosis not present

## 2022-02-18 DIAGNOSIS — R69 Illness, unspecified: Secondary | ICD-10-CM | POA: Diagnosis not present

## 2022-02-18 DIAGNOSIS — Z113 Encounter for screening for infections with a predominantly sexual mode of transmission: Secondary | ICD-10-CM | POA: Diagnosis not present

## 2022-02-18 DIAGNOSIS — Z13220 Encounter for screening for lipoid disorders: Secondary | ICD-10-CM | POA: Diagnosis not present

## 2022-02-21 ENCOUNTER — Other Ambulatory Visit (HOSPITAL_COMMUNITY): Payer: Self-pay

## 2022-02-24 ENCOUNTER — Other Ambulatory Visit: Payer: Self-pay | Admitting: Family Medicine

## 2022-02-24 DIAGNOSIS — I10 Essential (primary) hypertension: Secondary | ICD-10-CM

## 2022-02-25 ENCOUNTER — Encounter: Payer: Self-pay | Admitting: Internal Medicine

## 2022-02-25 NOTE — Telephone Encounter (Signed)
Douglas [name], I ? ?I have place paperwork in pharmacy box with your name on it. Can you let me know what insurance will cover or if he can just get a copay card. I do not see where he has tried and failed any other HIV meds before I start a PA on this? ?

## 2022-02-25 NOTE — Telephone Encounter (Signed)
No, we haven't gotten any faxes in the pharmacy for him.

## 2022-02-26 ENCOUNTER — Telehealth: Payer: Self-pay

## 2022-02-26 ENCOUNTER — Other Ambulatory Visit (HOSPITAL_COMMUNITY): Payer: Self-pay

## 2022-02-26 NOTE — Telephone Encounter (Signed)
[facility] Patient Advocate Encounter ?  ?Received notification from [facility] that prior authorization for Susanne Borders  is required. ? ?TIER EXCEPTION ?  ?PA submitted on 02/26/22 ?Key [id number] ?Status is pending ?   ?[facility] will continue to follow. ? ? ?Clearance Coots, CPhT ?Specialty Pharmacy Patient Advocate ?[facility] ?Phone: [phone] ?Fax:  [phone]  ?

## 2022-03-26 ENCOUNTER — Other Ambulatory Visit: Payer: Self-pay | Admitting: Family Medicine

## 2022-03-26 MED ORDER — NALTREXONE HCL 50 MG PO TABS: 50.0000 mg | ORAL_TABLET | Freq: Every day | ORAL | 0 refills | Status: DC

## 2022-04-08 ENCOUNTER — Ambulatory Visit (INDEPENDENT_AMBULATORY_CARE_PROVIDER_SITE_OTHER): Payer: No Typology Code available for payment source | Admitting: Cardiology

## 2022-04-08 ENCOUNTER — Encounter (HOSPITAL_BASED_OUTPATIENT_CLINIC_OR_DEPARTMENT_OTHER): Payer: Self-pay | Admitting: Cardiology

## 2022-04-08 VITALS — BP 132/76 | HR 68 | Ht 72.0 in | Wt 270.0 lb

## 2022-04-08 DIAGNOSIS — E7800 Pure hypercholesterolemia, unspecified: Secondary | ICD-10-CM

## 2022-04-08 DIAGNOSIS — I10 Essential (primary) hypertension: Secondary | ICD-10-CM | POA: Diagnosis not present

## 2022-04-08 DIAGNOSIS — R079 Chest pain, unspecified: Secondary | ICD-10-CM

## 2022-04-08 DIAGNOSIS — Z8249 Family history of ischemic heart disease and other diseases of the circulatory system: Secondary | ICD-10-CM

## 2022-04-08 DIAGNOSIS — Z7189 Other specified counseling: Secondary | ICD-10-CM | POA: Diagnosis not present

## 2022-04-08 NOTE — Patient Instructions (Signed)
Medication Instructions:  ?Your Physician recommend you continue on your current medication as directed.   ? ?*If you need a refill on your cardiac medications before your next appointment, please call your pharmacy* ? ? ?Lab Work: ?None ordered today ? ? ?Testing/Procedures: ?None ordered today ? ? ?Follow-Up: ?At [facility], you and your health needs are our priority.  As part of our continuing mission to provide you with exceptional heart care, we have created designated Provider Care Teams.  These Care Teams include your primary Cardiologist (physician) and Advanced Practice Providers (APPs -  Physician Assistants and Nurse Practitioners) who all work together to provide you with the care you need, when you need it. ? ?We recommend signing up for the patient portal called "MyChart".  Sign up information is provided on this After Visit Summary.  MyChart is used to connect with patients for Virtual Visits (Telemedicine).  Patients are able to view lab/test results, encounter notes, upcoming appointments, etc.  Non-urgent messages can be sent to your provider as well.   ?To learn more about what you can do with MyChart, go to [email or web address].   ? ?Your next appointment:   ?As needed ? ?The format for your next appointment:   ?In Person ? ?Provider:   ?Bridgette Christopher, MD{ ? ?Important Information About Sugar ? ? ? ? ? ? ?

## 2022-04-08 NOTE — Progress Notes (Signed)
Cardiology Office Note:    Date:  04/08/2022   ID:  ZAMIER MESSERLI, DOB 1981-10-08, MRN [id number]  PCP:  Gifford Shave, MD  Cardiologist:  Buford Dresser, MD  Referring MD: Gifford Shave, MD   CC: follow up  History of Present Illness:    Douglas [name] is a 41 y.o. male with a hx of HIV, hypertension. Initially seen 06/22/19 as a new consult at the request of Gifford Shave, MD for the evaluation and management of palpitations, chest pain, CV risk assessment.  Today: Note from visit 02/15/22 with Dr. Irma Newness reviewed. Patient had concerns re: hypertension and family history of heart disease. Also noted continued atypical chest pain, most recently associated with food. Recommended to follow up with cardiology for further evaluation.  Medications are working well. Blood pressures well controlled.   Father died of MI around age 28. Two paternal uncles died of heart attacks around age 10. None of them had much prior medical care. Mother was ~48 when she had a stroke. Maternal grandmother and paternal grandmother both had strokes. Had a sister, passed away from diabetes at age 68.  Most recent lipid panel showed Tchol 181, TG 70, HDL 51, LDL 117.  Reviewed his prior CT coronary. Discussed prognostic value of a zero calcium score. Has upper abdominal, central pain, feels like pressure, better with drinking alka-seltzer. He thinks it is most likely gas related based on the pattern and relationship.  Past Medical History:  Diagnosis Date   Family history of heart disease in male family member before age 74    HIV infection ([place]) 2014   Hypertension 2004   Low HDL (under 40)    Seasonal allergic rhinitis    weather changes, spring/fall   Sickle cell trait [facility])     Past Surgical History:  Procedure Laterality Date   NO PAST SURGERIES  03/2019    Current Medications: Current Outpatient Medications on File Prior to Visit  Medication Sig    bictegravir-emtricitabine-tenofovir AF (BIKTARVY) 50-200-25 MG TABS tablet Take 1 tablet by mouth daily.   losartan-hydrochlorothiazide (HYZAAR) 100-25 MG tablet TAKE 1 TABLET EVERY DAY   naltrexone (DEPADE) 50 MG tablet Take 1 tablet (50 mg total) by mouth daily.   [DISCONTINUED] rosuvastatin (CRESTOR) 10 MG tablet Take 1 tablet (10 mg total) by mouth at bedtime. (Patient not taking: Reported on 05/18/2019)   No current facility-administered medications on file prior to visit.     Allergies:   Shellfish allergy   Social History   Tobacco Use   Smoking status: Every Day    Packs/day: 0.50    Types: Cigarettes    Start date: 09/03/2013    Last attempt to quit: 11/14/2017    Years since quitting: 4.4   Smokeless tobacco: Never   Tobacco comments:    stress at his job  Scientific laboratory technician Use: Never used  Substance Use Topics   Alcohol use: Yes    Alcohol/week: 15.0 standard drinks    Types: 15 Glasses of wine per week   Drug use: No     Family History: The patient's family history includes Cancer in his paternal grandmother; Diabetes in his father and sister; Heart disease in his paternal uncle and paternal uncle; Heart disease (age of onset: 64) in his father; Hypertension in his maternal grandfather, maternal grandmother, mother, paternal uncle, and sister; Stroke in his maternal grandmother and mother.  ROS:   Please see the history of present illness.  Additional pertinent  ROS otherwise unremarkable.  EKGs/Labs/Other Studies Reviewed:    The following studies were reviewed today: Ct coronary 05/24/20 1. No evidence of CAD, CADRADS = 0.   2. Coronary calcium score of 0. This was 0 percentile for age and sex matched control.   3. Normal coronary origin with right dominance.  ETT 09/2018 Blood pressure demonstrated a hypertensive response to exercise. There was no ST segment deviation noted during stress. No T wave inversion was noted during stress. Negative, adequate  stress test.  EKG:  EKG is personally reviewed today. 04/08/22: NSR. Borderline LVH/RAE given age 25/10/20: NSR. Borderline LVH/RAE given age  Recent Labs: No results found for requested labs within last 8760 hours.  Recent Lipid Panel    Component Value Date/Time   CHOL 191 09/05/2020 1523   TRIG 220 (H) 09/05/2020 1523   HDL 45 09/05/2020 1523   CHOLHDL 4.2 09/05/2020 1523   CHOLHDL 4.6 12/16/2019 0904   VLDL 14 01/02/2017 1359   LDLCALC 108 (H) 09/05/2020 1523   LDLCALC 135 (H) 12/16/2019 0904    Physical Exam:    VS:  BP 132/76   Pulse 68   Ht 6' (1.829 m)   Wt 270 lb (122.5 kg)   SpO2 95%   BMI 36.62 kg/m     Wt Readings from Last 3 Encounters:  04/08/22 270 lb (122.5 kg)  07/20/21 259 lb 9.6 oz (117.8 kg)  06/04/21 259 lb (117.5 kg)    GEN: Well nourished, well developed in no acute distress HEENT: Normal, moist mucous membranes NECK: No JVD CARDIAC: regular rhythm, normal S1 and S2, no rubs or gallops. No murmur. VASCULAR: Radial and DP pulses 2+ bilaterally. No carotid bruits RESPIRATORY:  Clear to auscultation without rales, wheezing or rhonchi  ABDOMEN: Soft, non-tender, non-distended MUSCULOSKELETAL:  Ambulates independently SKIN: Warm and dry, no edema NEUROLOGIC:  Alert and oriented x 3. No focal neuro deficits noted. PSYCHIATRIC:  Normal affect    ASSESSMENT:    1. Chest pain of uncertain etiology   2. Family history of heart disease   3. Essential hypertension   4. Cardiac risk counseling   5. Counseling on health promotion and disease prevention   6. Pure hypercholesterolemia     PLAN:    History of chest pain:  -atypical -normal ETT 2019 -clean coronary CT 2021  Hypertension: -improved control -continue losartan-HCTZ  Hypercholesterolemia: LDL 135 on 12/16/19. Has stopped taking statin. -discussed that HIV is associated with CAD independently of other factors  Cardiac risk counseling: -has strong family history on his father's  side, though not technically premature  Prevention recommendations: -recommend heart healthy/Mediterranean diet, with whole grains, fruits, vegetable, fish, lean meats, nuts, and olive oil. Limit salt. -recommend moderate walking, 3-5 times/week for 30-50 minutes each session. Aim for at least 150 minutes.week. Goal should be pace of 3 miles/hours, or walking 1.5 miles in 30 minutes -recommend avoidance of tobacco products. Avoid excess alcohol.  Buford Dresser, MD, PhD, [place] for follow up: as needed  Medication Adjustments/Labs and Tests Ordered: Current medicines are reviewed at length with the patient today.  Concerns regarding medicines are outlined above.  Orders Placed This Encounter  Procedures   EKG 12-Lead   No orders of the defined types were placed in this encounter.   Patient Instructions  Medication Instructions:  Your Physician recommend you continue on your current medication as directed.    *If you need a refill on your cardiac  medications before your next appointment, please call your pharmacy*   Lab Work: None ordered today   Testing/Procedures: None ordered today   Follow-Up: At [facility], you and your health needs are our priority.  As part of our continuing mission to provide you with exceptional heart care, we have created designated Provider Care Teams.  These Care Teams include your primary Cardiologist (physician) and Advanced Practice Providers (APPs -  Physician Assistants and Nurse Practitioners) who all work together to provide you with the care you need, when you need it.  We recommend signing up for the patient portal called "MyChart".  Sign up information is provided on this After Visit Summary.  MyChart is used to connect with patients for Virtual Visits (Telemedicine).  Patients are able to view lab/test results, encounter notes, upcoming appointments, etc.  Non-urgent messages can be sent to your  provider as well.   To learn more about what you can do with MyChart, go to [email or web address].    Your next appointment:   As needed  The format for your next appointment:   In Person  Provider:   Buford Dresser, MD{   Important Information About Sugar         Signed, Buford Dresser, MD PhD 04/08/2022     [place]

## 2022-04-23 ENCOUNTER — Other Ambulatory Visit: Payer: Self-pay | Admitting: Family Medicine

## 2022-04-23 ENCOUNTER — Other Ambulatory Visit: Payer: Self-pay | Admitting: Internal Medicine

## 2022-04-23 DIAGNOSIS — B20 Human immunodeficiency virus [HIV] disease: Secondary | ICD-10-CM

## 2022-04-24 MED ORDER — NALTREXONE HCL 50 MG PO TABS: 50.0000 mg | ORAL_TABLET | Freq: Every day | ORAL | 0 refills | Status: DC

## 2022-04-30 ENCOUNTER — Encounter: Payer: Self-pay | Admitting: *Deleted

## 2022-05-11 ENCOUNTER — Other Ambulatory Visit: Payer: Self-pay | Admitting: Family Medicine

## 2022-05-11 DIAGNOSIS — I10 Essential (primary) hypertension: Secondary | ICD-10-CM

## 2022-06-11 ENCOUNTER — Ambulatory Visit (HOSPITAL_BASED_OUTPATIENT_CLINIC_OR_DEPARTMENT_OTHER): Payer: No Typology Code available for payment source | Admitting: Family Medicine

## 2022-06-14 ENCOUNTER — Other Ambulatory Visit (HOSPITAL_BASED_OUTPATIENT_CLINIC_OR_DEPARTMENT_OTHER): Payer: Self-pay

## 2022-06-17 ENCOUNTER — Other Ambulatory Visit: Payer: 59

## 2022-06-19 ENCOUNTER — Other Ambulatory Visit (HOSPITAL_BASED_OUTPATIENT_CLINIC_OR_DEPARTMENT_OTHER): Payer: Self-pay

## 2022-06-19 ENCOUNTER — Encounter (HOSPITAL_BASED_OUTPATIENT_CLINIC_OR_DEPARTMENT_OTHER): Payer: Self-pay

## 2022-06-20 ENCOUNTER — Other Ambulatory Visit: Payer: Self-pay

## 2022-06-20 ENCOUNTER — Other Ambulatory Visit: Payer: 59

## 2022-06-20 ENCOUNTER — Other Ambulatory Visit (HOSPITAL_COMMUNITY)
Admission: RE | Admit: 2022-06-20 | Discharge: 2022-06-20 | Disposition: A | Discharge status: Home / Self Care | Payer: 59 | Source: Ambulatory Visit | Attending: Internal Medicine | Admitting: Internal Medicine

## 2022-06-20 ENCOUNTER — Other Ambulatory Visit (HOSPITAL_BASED_OUTPATIENT_CLINIC_OR_DEPARTMENT_OTHER): Payer: Self-pay

## 2022-06-20 DIAGNOSIS — B20 Human immunodeficiency virus [HIV] disease: Secondary | ICD-10-CM

## 2022-06-20 DIAGNOSIS — Z113 Encounter for screening for infections with a predominantly sexual mode of transmission: Secondary | ICD-10-CM

## 2022-06-20 MED ORDER — BIKTARVY 50-200-25 MG PO TABS
ORAL_TABLET | ORAL | 3 refills | Status: DC
  Filled 2022-06-20 – 2022-06-24 (×5): qty 30, 30d supply, fill #0

## 2022-06-21 ENCOUNTER — Other Ambulatory Visit (HOSPITAL_BASED_OUTPATIENT_CLINIC_OR_DEPARTMENT_OTHER): Payer: Self-pay

## 2022-06-21 LAB — URINE CYTOLOGY ANCILLARY ONLY
Chlamydia: NEGATIVE
Comment: NEGATIVE
Comment: NORMAL
Neisseria Gonorrhea: NEGATIVE

## 2022-06-21 LAB — T-HELPER CELL (CD4) - (RCID CLINIC ONLY)
CD4 % Helper T Cell: 43 % (ref 33–65)
CD4 T Cell Abs: 505 /uL (ref 400–1790)

## 2022-06-24 ENCOUNTER — Other Ambulatory Visit (HOSPITAL_COMMUNITY): Payer: Self-pay

## 2022-06-24 ENCOUNTER — Ambulatory Visit (HOSPITAL_BASED_OUTPATIENT_CLINIC_OR_DEPARTMENT_OTHER): Payer: 59 | Admitting: Family Medicine

## 2022-06-24 ENCOUNTER — Telehealth: Payer: Self-pay

## 2022-06-24 ENCOUNTER — Other Ambulatory Visit (HOSPITAL_BASED_OUTPATIENT_CLINIC_OR_DEPARTMENT_OTHER): Payer: Self-pay

## 2022-06-24 ENCOUNTER — Encounter (HOSPITAL_BASED_OUTPATIENT_CLINIC_OR_DEPARTMENT_OTHER): Payer: Self-pay | Admitting: Family Medicine

## 2022-06-24 DIAGNOSIS — I10 Essential (primary) hypertension: Secondary | ICD-10-CM | POA: Diagnosis not present

## 2022-06-24 DIAGNOSIS — E66812 Obesity, class 2: Secondary | ICD-10-CM

## 2022-06-24 DIAGNOSIS — E669 Obesity, unspecified: Secondary | ICD-10-CM

## 2022-06-24 DIAGNOSIS — Z0000 Encounter for general adult medical examination without abnormal findings: Secondary | ICD-10-CM

## 2022-06-24 LAB — CBC WITH DIFFERENTIAL/PLATELET
Absolute Monocytes: 617 cells/uL (ref 200–950)
Basophils Absolute: 32 cells/uL (ref 0–200)
Basophils Relative: 0.7 %
Eosinophils Absolute: 257 cells/uL (ref 15–500)
Eosinophils Relative: 5.7 %
HCT: 43.9 % (ref 38.5–50.0)
Hemoglobin: 14.5 g/dL (ref 13.2–17.1)
Lymphs Abs: 1233 cells/uL (ref 850–3900)
MCH: 28.4 pg (ref 27.0–33.0)
MCHC: 33 g/dL (ref 32.0–36.0)
MCV: 86.1 fL (ref 80.0–100.0)
MPV: 10.4 fL (ref 7.5–12.5)
Monocytes Relative: 13.7 %
Neutro Abs: 2363 cells/uL (ref 1500–7800)
Neutrophils Relative %: 52.5 %
Platelets: 216 10*3/uL (ref 140–400)
RBC: 5.1 10*6/uL (ref 4.20–5.80)
RDW: 13.5 % (ref 11.0–15.0)
Total Lymphocyte: 27.4 %
WBC: 4.5 10*3/uL (ref 3.8–10.8)

## 2022-06-24 LAB — COMPLETE METABOLIC PANEL WITH GFR
AG Ratio: 1.8 (calc) (ref 1.0–2.5)
ALT: 29 U/L (ref 9–46)
AST: 16 U/L (ref 10–40)
Albumin: 4.5 g/dL (ref 3.6–5.1)
Alkaline phosphatase (APISO): 58 U/L (ref 36–130)
BUN: 16 mg/dL (ref 7–25)
CO2: 27 mmol/L (ref 20–32)
Calcium: 9.6 mg/dL (ref 8.6–10.3)
Chloride: 104 mmol/L (ref 98–110)
Creat: 1.07 mg/dL (ref 0.60–1.29)
Globulin: 2.5 g/dL (calc) (ref 1.9–3.7)
Glucose, Bld: 97 mg/dL (ref 65–99)
Potassium: 3.8 mmol/L (ref 3.5–5.3)
Sodium: 140 mmol/L (ref 135–146)
Total Bilirubin: 0.6 mg/dL (ref 0.2–1.2)
Total Protein: 7 g/dL (ref 6.1–8.1)
eGFR: 90 mL/min/{1.73_m2} (ref >=60)

## 2022-06-24 LAB — HIV-1 RNA QUANT-NO REFLEX-BLD
HIV 1 RNA Quant: NOT DETECTED Copies/mL
HIV-1 RNA Quant, Log: NOT DETECTED Log cps/mL

## 2022-06-24 NOTE — Progress Notes (Signed)
New Patient Office Visit  Subjective    Patient ID: Douglas [name], male    DOB: 26-Mar-1981  Age: 41 y.o. MRN: [id number]  CC:  Chief Complaint  Patient presents with   New Patient (Initial Visit)    Pt here to establish new care     HPI Douglas [name] presents to establish care Last PCP - Dr. Corbin Ade [facility]  HTN: Currently taking losartan-HCTZ combination. Has been taking current dose for a number of years. Check infrequently at home. No CP or headaches. Has seen Dr. Cristal Deer in the past for chest pain - now following up as needed.  Does smoke cigarettes, about 1 pack per week, used to smoke more in the past. Is interested in complete cessation. Has been smoking for about 15 years, about 1 ppd.  He does follow with Infectious disease for HIV management.  Currently taking Biktarvy.  Patient is originally from [place]. 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 Outside of work, he enjoys being active, walking.   Outpatient Encounter Medications as of 06/24/2022  Medication Sig   bictegravir-emtricitabine-tenofovir AF (BIKTARVY) 50-200-25 MG TABS tablet TAKE 1 TABLET BY MOUTH 1 TIME A DAY.   losartan-hydrochlorothiazide (HYZAAR) 100-25 MG tablet TAKE 1 TABLET BY MOUTH DAILY   [DISCONTINUED] naltrexone (DEPADE) 50 MG tablet Take 1 tablet (50 mg total) by mouth daily.   [DISCONTINUED] rosuvastatin (CRESTOR) 10 MG tablet Take 1 tablet (10 mg total) by mouth at bedtime. (Patient not taking: Reported on 05/18/2019)   No facility-administered encounter medications on file as of 06/24/2022.    Past Medical History:  Diagnosis Date   Family history of heart disease in male family member before age 10    HIV infection ([facility]) 2014   Hypertension 2004   Low HDL (under 40)    Seasonal allergic rhinitis    weather changes, spring/fall   Sickle cell trait ([facility])     Past Surgical History:  Procedure Laterality  Date   NO PAST SURGERIES  03/2019    Family History  Problem Relation Age of Onset   Hypertension Mother    Stroke Mother    Heart disease Father 42       died of MI   Diabetes Father    Hypertension Sister    Diabetes Sister        type 2   Heart disease Paternal Uncle        several uncles died in 9s with MI   Hypertension Paternal Uncle    Hypertension Maternal Grandmother    Stroke Maternal Grandmother    Hypertension Maternal Grandfather    Cancer Paternal Grandmother        lung   Heart disease Paternal Uncle        MI in 33s    Social History   Socioeconomic History   Marital status: Significant Other    Spouse name: Not on file   Number of children: Not on file   Years of education: Not on file   Highest education level: Not on file  Occupational History   Not on file  Tobacco Use   Smoking status: Every Day    Packs/day: 0.50    Types: Cigarettes    Start date: 09/03/2013    Last attempt to quit: 11/14/2017    Years since quitting: 4.6   Smokeless tobacco: Never   Tobacco comments:  stress at his job  Building services engineer Use: Never used  Substance and Sexual Activity   Alcohol use: Yes    Alcohol/week: 15.0 standard drinks of alcohol    Types: 15 Glasses of wine per week   Drug use: No   Sexual activity: Yes    Partners: Male    Birth control/protection: Condom    Comment: accepted condomos  Other Topics Concern   Not on file  Social History Narrative   Psychologist, counselling, assist nursing with re-certifications, [employer].   Exercise - video programs.  Weight bearing exercise as well  03/2019   Social Determinants of Health   Financial Resource Strain: Not on file  Food Insecurity: Not on file  Transportation Needs: Not on file  Physical Activity: Not on file  Stress: Not on file  Social Connections: Not on file  Intimate Partner Violence: Not on file    Objective    BP (!) 153/94   Pulse 68   Temp 97.8 F  (36.6 C) (Oral)   Ht 6' (1.829 m)   Wt 270 lb 12.8 oz (122.8 kg)   SpO2 100%   BMI 36.73 kg/m   Physical Exam  41 year old male in no acute distress Cardiovascular exam with regular rate and rhythm, no murmur appreciated Lungs clear to auscultation bilaterally  Assessment & Plan:   Problem List Items Addressed This Visit       Cardiovascular and Mediastinum   Essential hypertension    Blood pressure slightly above goal in office today.  Will allow for patient to continue with current regimen of losartan-hydrochlorothiazide.  Discussed recommendation for lifestyle modifications including DASH diet, working towards healthy, gradual weight loss.  Have also provided referral to nutritionist related to underlying hypertension as well as to discuss measures to work towards weight loss Recommend intermittent monitoring at home Plan for close follow-up to continue to monitor blood pressure      Relevant Orders   Amb ref to Medical Nutrition Therapy-MNT     Other   Obesity, Class II, BMI 35-39.9    Patient interested in referral to nutritionist Discussed general recommendations regarding weight loss, physical activity Feel that working towards healthy, gradual weight loss and working to lower BMI would help to reduce risk of chronic medical conditions for patient and would also aid in better control of blood pressure      Relevant Orders   Amb ref to Medical Nutrition Therapy-MNT   Other Visit Diagnoses     Wellness examination       Relevant Orders   Lipid panel   TSH Rfx on Abnormal to Free T4   Hemoglobin A1c       Return in about 6 weeks (around 08/05/2022) for CPE with [facility]W a few days prior.   Holbert Caples J De [place], MD

## 2022-06-24 NOTE — Patient Instructions (Signed)
  Medication Instructions:  Your physician recommends that you continue on your current medications as directed. Please refer to the Current Medication list given to you today. --If you need a refill on any your medications before your next appointment, please call your pharmacy first. If no refills are authorized on file call the office.-- Lab Work: Your physician has recommended that you have lab work today: No If you have labs (blood work) drawn today and your tests are completely normal, you will receive your results via MyChart message OR a phone call from our staff.  Please ensure you check your voicemail in the event that you authorized detailed messages to be left on a delegated number. If you have any lab test that is abnormal or we need to change your treatment, we will call you to review the results.  Referrals/Procedures/Imaging: No  Follow-Up: Your next appointment:   Your physician recommends that you schedule a follow-up appointment in: 1-2 cpe with Dr. de Peru. Nurse visit 1 week before for labs.  You will receive a text message or e-mail with a link to a survey about your care and experience with [place] today! We would greatly appreciate your feedback!   Thanks for letting us be apart of your health journey!!  Primary Care and Sports Medicine   Dr. Ceasar Mons [place]   We encourage you to activate your patient portal called "MyChart".  Sign up information is provided on this After Visit Summary.  MyChart is used to connect with patients for Virtual Visits (Telemedicine).  Patients are able to view lab/test results, encounter notes, upcoming appointments, etc.  Non-urgent messages can be sent to your provider as well. To learn more about what you can do with MyChart, please visit --  [email or web address].

## 2022-06-24 NOTE — Telephone Encounter (Signed)
[facility] Patient Advocate Encounter   I was successful in securing patient a $7500.00 grant from [employer] ([employer]) to provide copayment coverage for Biktarvy.  This will make the out of pocket cost $0.00.     I have spoken with the patient.    The billing information is as follows and has been shared with [facility].        Patient knows to call the office with questions or concerns.  Desiraye Rolfson, CPhT [facility] Phone: [phone] Fax:  [phone]  

## 2022-06-24 NOTE — Assessment & Plan Note (Signed)
Patient interested in referral to nutritionist Discussed general recommendations regarding weight loss, physical activity Feel that working towards healthy, gradual weight loss and working to lower BMI would help to reduce risk of chronic medical conditions for patient and would also aid in better control of blood pressure

## 2022-06-24 NOTE — Assessment & Plan Note (Signed)
Blood pressure slightly above goal in office today.  Will allow for patient to continue with current regimen of losartan-hydrochlorothiazide.  Discussed recommendation for lifestyle modifications including DASH diet, working towards healthy, gradual weight loss.  Have also provided referral to nutritionist related to underlying hypertension as well as to discuss measures to work towards weight loss Recommend intermittent monitoring at home Plan for close follow-up to continue to monitor blood pressure

## 2022-06-25 ENCOUNTER — Other Ambulatory Visit (HOSPITAL_COMMUNITY): Payer: Self-pay

## 2022-07-02 ENCOUNTER — Encounter: Payer: 59 | Admitting: Internal Medicine

## 2022-07-02 ENCOUNTER — Ambulatory Visit: Payer: Self-pay | Admitting: Internal Medicine

## 2022-07-04 ENCOUNTER — Encounter (HOSPITAL_BASED_OUTPATIENT_CLINIC_OR_DEPARTMENT_OTHER): Payer: Self-pay | Admitting: Family Medicine

## 2022-07-04 ENCOUNTER — Other Ambulatory Visit (HOSPITAL_BASED_OUTPATIENT_CLINIC_OR_DEPARTMENT_OTHER): Payer: Self-pay

## 2022-07-04 ENCOUNTER — Other Ambulatory Visit: Payer: Self-pay | Admitting: Family Medicine

## 2022-07-04 DIAGNOSIS — I10 Essential (primary) hypertension: Secondary | ICD-10-CM

## 2022-07-04 MED ORDER — LOSARTAN POTASSIUM-HCTZ 100-25 MG PO TABS
1.0000 | ORAL_TABLET | Freq: Every day | ORAL | 1 refills | Status: DC
  Filled 2022-07-04: qty 90, 90d supply, fill #0
  Filled 2022-09-29: qty 90, 90d supply, fill #1

## 2022-07-09 ENCOUNTER — Other Ambulatory Visit: Payer: Self-pay

## 2022-07-09 ENCOUNTER — Encounter: Payer: Self-pay | Admitting: Internal Medicine

## 2022-07-09 ENCOUNTER — Ambulatory Visit: Payer: 59 | Admitting: Internal Medicine

## 2022-07-09 ENCOUNTER — Other Ambulatory Visit (HOSPITAL_COMMUNITY): Payer: Self-pay

## 2022-07-09 DIAGNOSIS — B20 Human immunodeficiency virus [HIV] disease: Secondary | ICD-10-CM | POA: Diagnosis not present

## 2022-07-09 DIAGNOSIS — Z720 Tobacco use: Secondary | ICD-10-CM

## 2022-07-09 DIAGNOSIS — F1010 Alcohol abuse, uncomplicated: Secondary | ICD-10-CM

## 2022-07-09 MED ORDER — BIKTARVY 50-200-25 MG PO TABS
ORAL_TABLET | ORAL | 11 refills | Status: DC
  Filled 2022-07-09: qty 30, fill #0
  Filled 2022-07-18: qty 30, 30d supply, fill #0
  Filled 2022-08-13: qty 30, 30d supply, fill #1
  Filled 2022-09-10: qty 30, 30d supply, fill #2
  Filled 2022-10-11: qty 30, 30d supply, fill #3
  Filled 2022-11-07: qty 30, 30d supply, fill #4
  Filled 2022-12-05: qty 30, 30d supply, fill #5
  Filled 2023-01-02: qty 30, 30d supply, fill #6
  Filled 2023-01-27: qty 30, 30d supply, fill #7
  Filled 2023-02-28: qty 30, 30d supply, fill #8
  Filled 2023-03-27 – 2023-04-01 (×3): qty 30, 30d supply, fill #9

## 2022-07-09 NOTE — Assessment & Plan Note (Signed)
His infection has been under excellent, long-term control.  He will continue Biktarvy and follow-up after lab work in 1 year. 

## 2022-07-09 NOTE — Progress Notes (Signed)
Patient Active Problem List   Diagnosis Date Noted   Human immunodeficiency virus (HIV) disease (HCC) 06/25/2013    Priority: High   Itching 06/04/2021   Anxiety 01/18/2021   Alcohol abuse 09/06/2020   Tobacco abuse 09/05/2020   Blood in semen 07/28/2020   Screen for STD (sexually transmitted disease) 07/28/2020   Screening for prostate cancer 07/28/2020   Family history of heart disease 06/22/2019   Pure hypercholesterolemia 06/22/2019   Heart palpitations 06/07/2019   Family history of premature CAD 04/14/2019   Atypical chest pain 09/18/2018   Obesity, Class II, BMI 35-39.9 01/01/2018   Screening examination for venereal disease 07/27/2014   Screening for condition 07/27/2014   Seborrheic dermatitis 08/19/2013   Essential hypertension 06/25/2013    Patient's Medications  New Prescriptions   No medications on file  Previous Medications   LOSARTAN-HYDROCHLOROTHIAZIDE (HYZAAR) 100-25 MG TABLET    TAKE 1 TABLET BY MOUTH DAILY  Modified Medications   Modified Medication Previous Medication   BICTEGRAVIR-EMTRICITABINE-TENOFOVIR AF (BIKTARVY) 50-200-25 MG TABS TABLET bictegravir-emtricitabine-tenofovir AF (BIKTARVY) 50-200-25 MG TABS tablet      TAKE 1 TABLET BY MOUTH 1 TIME A DAY.    TAKE 1 TABLET BY MOUTH 1 TIME A DAY.  Discontinued Medications   No medications on file    Subjective: Douglas [name] is in for his routine HIV follow-up visit.  He recently got a new checking patient stating that the new [facility].  He tells me that he loves his new job.  He thinks he is only missed 1 dose of Biktarvy in the past year.  He is not on any new medications since his last visit.  He says he has cut down on his alcohol consumption and cigarette smoking "drastically".  He only drinks and smokes on Saturday and Sundays.  He did quit smoking cigarettes for 3 months several years ago.  He walks for exercise.  He denies feeling anxious or depressed.  Review of  Systems: Review of Systems  Constitutional:  Negative for weight loss.  Psychiatric/Behavioral:  Negative for depression. The patient is not nervous/anxious.     Past Medical History:  Diagnosis Date   Family history of heart disease in male family member before age 78    HIV infection (HCC) 2014   Hypertension 2004   Low HDL (under 40)    Seasonal allergic rhinitis    weather changes, spring/fall   Sickle cell trait (HCC)     Social History   Tobacco Use   Smoking status: Every Day    Packs/day: 0.50    Types: Cigarettes    Start date: 09/03/2013    Last attempt to quit: 11/14/2017    Years since quitting: 4.6   Smokeless tobacco: Never   Tobacco comments:    stress at his job  Building services engineer Use: Never used  Substance Use Topics   Alcohol use: Yes    Alcohol/week: 15.0 standard drinks of alcohol    Types: 15 Glasses of wine per week   Drug use: No    Family History  Problem Relation Age of Onset   Hypertension Mother    Stroke Mother    Heart disease Father 37       died of MI   Diabetes Father    Hypertension Sister    Diabetes Sister        type 2   Heart disease Paternal Uncle  several uncles died in 13s with MI   Hypertension Paternal Uncle    Hypertension Maternal Grandmother    Stroke Maternal Grandmother    Hypertension Maternal Grandfather    Cancer Paternal Grandmother        lung   Heart disease Paternal Uncle        MI in 71s    Allergies  Allergen Reactions   Shellfish Allergy Anaphylaxis    Health Maintenance  Topic Date Due   COVID-19 Vaccine (4 - Mixed Product risk series) 01/29/2021   INFLUENZA VACCINE  06/25/2022   TETANUS/TDAP  06/10/2023   Hepatitis C Screening  Completed   HIV Screening  Completed   HPV VACCINES  Aged Out    Objective:  Vitals:   07/09/22 1504  BP: (!) 155/84  Pulse: 69  Temp: 98.5 F (36.9 C)  TempSrc: Oral  SpO2: 92%  Weight: 272 lb (123.4 kg)   Body mass index is 36.89  kg/m.  Physical Exam Constitutional:      Comments: He is he is in good spirits.  Cardiovascular:     Rate and Rhythm: Normal rate and regular rhythm.     Heart sounds: No murmur heard. Pulmonary:     Effort: Pulmonary effort is normal.     Breath sounds: Normal breath sounds.  Psychiatric:        Mood and Affect: Mood normal.     Lab Results Lab Results  Component Value Date   WBC 4.5 06/20/2022   HGB 14.5 06/20/2022   HCT 43.9 06/20/2022   MCV 86.1 06/20/2022   PLT 216 06/20/2022    Lab Results  Component Value Date   CREATININE 1.07 06/20/2022   BUN 16 06/20/2022   NA 140 06/20/2022   K 3.8 06/20/2022   CL 104 06/20/2022   CO2 27 06/20/2022    Lab Results  Component Value Date   ALT 29 06/20/2022   AST 16 06/20/2022   ALKPHOS 52 09/15/2018   BILITOT 0.6 06/20/2022    Lab Results  Component Value Date   CHOL 191 09/05/2020   HDL 45 09/05/2020   LDLCALC 108 (H) 09/05/2020   TRIG 220 (H) 09/05/2020   CHOLHDL 4.2 09/05/2020   Lab Results  Component Value Date   LABRPR NON-REACTIVE 06/04/2021   RPRTITER 1:1 06/17/2013   HIV 1 RNA Quant (Copies/mL)  Date Value  06/20/2022 Not Detected  06/04/2021 <20 (H)  01/01/2021 <20   CD4 T Cell Abs (/uL)  Date Value  06/20/2022 505  06/04/2021 578  01/01/2021 531     Problem List Items Addressed This Visit       High   Human immunodeficiency virus (HIV) disease (HCC) (Chronic)    His infection has been under excellent, long-term control.  He will continue Biktarvy and follow-up after lab work in 1 year.      Relevant Medications   bictegravir-emtricitabine-tenofovir AF (BIKTARVY) 50-200-25 MG TABS tablet   Other Relevant Orders   CBC   T-helper cells (CD4) count (not at [facility])   Comprehensive metabolic panel   Lipid panel   RPR   HIV-1 RNA quant-no reflex-bld     Unprioritized   Tobacco abuse    I encouraged him to set a quit date in the near future.      Alcohol abuse    Congratulated him  on cutting down.  He appears motivated to quit completely.         [name], MD [facility]  for Infectious Disease [facility] [phone] pager   [phone] cell 07/09/2022, 3:33 PM

## 2022-07-09 NOTE — Assessment & Plan Note (Signed)
Congratulated him on cutting down.  He appears motivated to quit completely.

## 2022-07-09 NOTE — Assessment & Plan Note (Signed)
I encouraged him to set a quit date in the near future.

## 2022-07-10 ENCOUNTER — Encounter (HOSPITAL_BASED_OUTPATIENT_CLINIC_OR_DEPARTMENT_OTHER): Payer: Self-pay | Admitting: Family Medicine

## 2022-07-10 ENCOUNTER — Other Ambulatory Visit (HOSPITAL_BASED_OUTPATIENT_CLINIC_OR_DEPARTMENT_OTHER): Payer: Self-pay

## 2022-07-10 ENCOUNTER — Ambulatory Visit (HOSPITAL_BASED_OUTPATIENT_CLINIC_OR_DEPARTMENT_OTHER): Payer: 59 | Admitting: Family Medicine

## 2022-07-10 VITALS — BP 149/98 | HR 72 | Ht 72.0 in | Wt 270.7 lb

## 2022-07-10 DIAGNOSIS — I10 Essential (primary) hypertension: Secondary | ICD-10-CM | POA: Diagnosis not present

## 2022-07-10 DIAGNOSIS — E669 Obesity, unspecified: Secondary | ICD-10-CM

## 2022-07-10 MED ORDER — WEGOVY 0.25 MG/0.5ML ~~LOC~~ SOAJ
0.2500 mg | SUBCUTANEOUS | 0 refills | Status: DC
  Filled 2022-07-10 – 2022-07-17 (×3): qty 2, 28d supply, fill #0

## 2022-07-10 NOTE — Patient Instructions (Signed)

## 2022-07-10 NOTE — Assessment & Plan Note (Signed)
Patient presents today primarily with questions regarding pharmacotherapy to address elevated BMI.  Specifically, he has questions regarding Wegovy and utilizing this to assist with weight loss efforts.  Patient has been working on lifestyle modifications including diet and exercise but continues to have elevated BMI.  I do feel that his elevated BMI places him at increased risk for chronic medical conditions, which at present he already has diagnosis of hypertension being treated with pharmacotherapy. Today we discussed pharmacotherapy options including injectable GLP-1 receptor agonist.  After long discussion, he is amenable to initiation of Wegovy.  Prescription sent to pharmacy on file, discussed that we will need to obtain prior authorization more than likely and once we do, will need to ensure that it is not cost prohibitive for patient. Discussed possibility that daily injectable may be covered if Reginal Lutes is not, if this is the case, he would be open to starting daily injectable option. For now, we will plan for follow-up as needed, discussed that once he is able to start medication, would plan for follow-up about 1 month later to assess progress and determine appropriateness of dose titration

## 2022-07-10 NOTE — Assessment & Plan Note (Signed)
Blood pressure is slightly elevated in office today.  He does continue with losartan-hydrochlorothiazide 100-25 mg tablet.  No current issues related this.  Has not had any chest pain or headaches. Recommend continuing with lifestyle modifications and we will also be working on weight loss efforts as outlined above Recommend intermittent monitoring at home, DASH diet

## 2022-07-10 NOTE — Progress Notes (Signed)
    Procedures performed today:    None.  Independent interpretation of notes and tests performed by another provider:   None.  Brief History, Exam, Impression, and Recommendations:    BP (!) 149/98   Pulse 72   Ht 6' (1.829 m)   Wt 270 lb 11.2 oz (122.8 kg)   SpO2 97%   BMI 36.71 kg/m   Obesity, Class II, BMI 35-39.9 Patient presents today primarily with questions regarding pharmacotherapy to address elevated BMI.  Specifically, he has questions regarding Wegovy and utilizing this to assist with weight loss efforts.  Patient has been working on lifestyle modifications including diet and exercise but continues to have elevated BMI.  I do feel that his elevated BMI places him at increased risk for chronic medical conditions, which at present he already has diagnosis of hypertension being treated with pharmacotherapy. Today we discussed pharmacotherapy options including injectable GLP-1 receptor agonist.  After long discussion, he is amenable to initiation of Wegovy.  Prescription sent to pharmacy on file, discussed that we will need to obtain prior authorization more than likely and once we do, will need to ensure that it is not cost prohibitive for patient. Discussed possibility that daily injectable may be covered if Reginal Lutes is not, if this is the case, he would be open to starting daily injectable option. For now, we will plan for follow-up as needed, discussed that once he is able to start medication, would plan for follow-up about 1 month later to assess progress and determine appropriateness of dose titration  Essential hypertension Blood pressure is slightly elevated in office today.  He does continue with losartan-hydrochlorothiazide 100-25 mg tablet.  No current issues related this.  Has not had any chest pain or headaches. Recommend continuing with lifestyle modifications and we will also be working on weight loss efforts as outlined above Recommend intermittent monitoring at  home, DASH diet  Return if symptoms worsen or fail to improve.  Patient will contact the office to schedule follow-up in about 1 month after initiating GLP-1 receptor agonist   ___________________________________________ Mauri Tolen de [place], MD, ABFM, CAQSM Primary Care and Sports Medicine [facility]

## 2022-07-11 ENCOUNTER — Other Ambulatory Visit (HOSPITAL_BASED_OUTPATIENT_CLINIC_OR_DEPARTMENT_OTHER): Payer: Self-pay

## 2022-07-12 ENCOUNTER — Telehealth (HOSPITAL_BASED_OUTPATIENT_CLINIC_OR_DEPARTMENT_OTHER): Payer: Self-pay | Admitting: Family Medicine

## 2022-07-12 ENCOUNTER — Encounter (HOSPITAL_BASED_OUTPATIENT_CLINIC_OR_DEPARTMENT_OTHER): Payer: Self-pay

## 2022-07-12 ENCOUNTER — Other Ambulatory Visit (HOSPITAL_BASED_OUTPATIENT_CLINIC_OR_DEPARTMENT_OTHER): Payer: Self-pay

## 2022-07-12 NOTE — Telephone Encounter (Signed)
Recd fax from [employer] -- Denied Wegovy 0.25 mg   Paperwork in providers Yellow box

## 2022-07-15 ENCOUNTER — Other Ambulatory Visit (HOSPITAL_BASED_OUTPATIENT_CLINIC_OR_DEPARTMENT_OTHER): Payer: Self-pay

## 2022-07-17 ENCOUNTER — Other Ambulatory Visit (HOSPITAL_BASED_OUTPATIENT_CLINIC_OR_DEPARTMENT_OTHER): Payer: Self-pay

## 2022-07-17 NOTE — Telephone Encounter (Signed)
Received appeal paperwork for [facility] on 8/23 @ 11:06 for provider to fill out. Paperwork is in providers yellow tray. Please advise.

## 2022-07-18 ENCOUNTER — Telehealth (HOSPITAL_BASED_OUTPATIENT_CLINIC_OR_DEPARTMENT_OTHER): Payer: Self-pay

## 2022-07-18 ENCOUNTER — Other Ambulatory Visit (HOSPITAL_COMMUNITY): Payer: Self-pay

## 2022-07-18 NOTE — Telephone Encounter (Signed)
Received a fax from [employer] letting the office know they have received the level 1 appeal and the determination will be mailed out no less than 15 days from receipt of your appeal. Please advise.

## 2022-07-18 NOTE — Telephone Encounter (Signed)
Pt was called and he is aware of his approval for wegovy. I received the appeal approval from [employer] [facility] system on today's date 07-18-22.

## 2022-07-18 NOTE — Telephone Encounter (Signed)
[place] Approval came back via fax on 8/24 @ 10:46. Fax stated GTXMIW 0.25mg /0.5 ml pen has been approved. Effective for a maximum of 7 fills from 07/18/22 to 02/06/23.  Fax is in provider yellow tray for CMA. Please advise.

## 2022-07-19 ENCOUNTER — Other Ambulatory Visit (HOSPITAL_BASED_OUTPATIENT_CLINIC_OR_DEPARTMENT_OTHER): Payer: Self-pay

## 2022-07-22 ENCOUNTER — Other Ambulatory Visit (HOSPITAL_BASED_OUTPATIENT_CLINIC_OR_DEPARTMENT_OTHER): Payer: Self-pay

## 2022-07-22 ENCOUNTER — Other Ambulatory Visit (HOSPITAL_COMMUNITY): Payer: Self-pay

## 2022-07-23 ENCOUNTER — Other Ambulatory Visit (HOSPITAL_BASED_OUTPATIENT_CLINIC_OR_DEPARTMENT_OTHER): Payer: Self-pay

## 2022-07-24 ENCOUNTER — Other Ambulatory Visit (HOSPITAL_BASED_OUTPATIENT_CLINIC_OR_DEPARTMENT_OTHER): Payer: Self-pay

## 2022-07-30 ENCOUNTER — Telehealth (HOSPITAL_BASED_OUTPATIENT_CLINIC_OR_DEPARTMENT_OTHER): Payer: Self-pay | Admitting: Family Medicine

## 2022-07-30 ENCOUNTER — Other Ambulatory Visit (HOSPITAL_BASED_OUTPATIENT_CLINIC_OR_DEPARTMENT_OTHER): Payer: Self-pay

## 2022-07-30 DIAGNOSIS — E669 Obesity, unspecified: Secondary | ICD-10-CM

## 2022-07-30 MED ORDER — WEGOVY 0.25 MG/0.5ML ~~LOC~~ SOAJ
0.2500 mg | SUBCUTANEOUS | 0 refills | Status: DC
  Filled 2022-07-30 – 2023-01-02 (×11): qty 2, 28d supply, fill #0

## 2022-07-30 NOTE — Telephone Encounter (Signed)
Please send in [facility] ---he is doing good with it

## 2022-08-01 ENCOUNTER — Other Ambulatory Visit (HOSPITAL_BASED_OUTPATIENT_CLINIC_OR_DEPARTMENT_OTHER): Payer: Self-pay

## 2022-08-08 ENCOUNTER — Other Ambulatory Visit (HOSPITAL_COMMUNITY): Payer: Self-pay

## 2022-08-10 ENCOUNTER — Other Ambulatory Visit (HOSPITAL_COMMUNITY): Payer: Self-pay

## 2022-08-11 ENCOUNTER — Other Ambulatory Visit (HOSPITAL_COMMUNITY): Payer: Self-pay

## 2022-08-13 ENCOUNTER — Other Ambulatory Visit (HOSPITAL_COMMUNITY): Payer: Self-pay

## 2022-08-16 ENCOUNTER — Other Ambulatory Visit (HOSPITAL_COMMUNITY): Payer: Self-pay

## 2022-08-17 ENCOUNTER — Encounter (HOSPITAL_BASED_OUTPATIENT_CLINIC_OR_DEPARTMENT_OTHER): Payer: Self-pay | Admitting: Family Medicine

## 2022-08-19 ENCOUNTER — Ambulatory Visit (HOSPITAL_BASED_OUTPATIENT_CLINIC_OR_DEPARTMENT_OTHER): Payer: 59

## 2022-08-19 DIAGNOSIS — Z0000 Encounter for general adult medical examination without abnormal findings: Secondary | ICD-10-CM

## 2022-08-20 ENCOUNTER — Other Ambulatory Visit (HOSPITAL_COMMUNITY): Payer: Self-pay

## 2022-08-20 LAB — LIPID PANEL
Chol/HDL Ratio: 4 ratio (ref 0.0–5.0)
Cholesterol, Total: 162 mg/dL (ref 100–199)
HDL: 41 mg/dL (ref >39)
LDL Chol Calc (NIH): 93 mg/dL (ref 0–99)
Triglycerides: 158 mg/dL — ABNORMAL HIGH (ref 0–149)
VLDL Cholesterol Cal: 28 mg/dL (ref 5–40)

## 2022-08-20 LAB — HEMOGLOBIN A1C
Est. average glucose Bld gHb Est-mCnc: 126 mg/dL
Hgb A1c MFr Bld: 6 % — ABNORMAL HIGH (ref 4.8–5.6)

## 2022-08-20 LAB — TSH RFX ON ABNORMAL TO FREE T4: TSH: 2.5 u[IU]/mL (ref 0.450–4.500)

## 2022-08-21 ENCOUNTER — Telehealth (HOSPITAL_BASED_OUTPATIENT_CLINIC_OR_DEPARTMENT_OTHER): Payer: Self-pay

## 2022-08-21 NOTE — Telephone Encounter (Signed)
This patient wanted to know if he could switch to a different dose of Wegovy ? Read below   Thanks  I was told that wegovy is on a shortage for the lower dosages until the beginning of the year.  Do you know of there is an alternate,  or if I could jump up to the 2.7. I've had no side effects of the injection.

## 2022-08-26 ENCOUNTER — Encounter (HOSPITAL_BASED_OUTPATIENT_CLINIC_OR_DEPARTMENT_OTHER): Payer: Self-pay | Admitting: Family Medicine

## 2022-08-26 ENCOUNTER — Ambulatory Visit (INDEPENDENT_AMBULATORY_CARE_PROVIDER_SITE_OTHER): Payer: 59 | Admitting: Family Medicine

## 2022-08-26 VITALS — BP 147/95 | HR 76 | Temp 97.7°F | Ht 72.0 in | Wt 267.0 lb

## 2022-08-26 DIAGNOSIS — Z1331 Encounter for screening for depression: Secondary | ICD-10-CM | POA: Insufficient documentation

## 2022-08-26 DIAGNOSIS — Z23 Encounter for immunization: Secondary | ICD-10-CM | POA: Diagnosis not present

## 2022-08-26 DIAGNOSIS — E669 Obesity, unspecified: Secondary | ICD-10-CM

## 2022-08-26 DIAGNOSIS — Z0000 Encounter for general adult medical examination without abnormal findings: Secondary | ICD-10-CM

## 2022-08-26 DIAGNOSIS — R7303 Prediabetes: Secondary | ICD-10-CM | POA: Insufficient documentation

## 2022-08-26 NOTE — Progress Notes (Unsigned)
Subjective:    CC: Annual Physical Exam  HPI:  Douglas [name] is a 41 y.o. presenting for annual physical  I reviewed the past medical history, family history, social history, surgical history, and allergies today and no changes were needed.  Please see the problem list section below in epic for further details.  Past Medical History: Past Medical History:  Diagnosis Date   Family history of heart disease in male family member before age 35    HIV infection ([place]) 2014   Hypertension 2004   Low HDL (under 40)    Seasonal allergic rhinitis    weather changes, spring/fall   Sickle cell trait ([place])    Past Surgical History: Past Surgical History:  Procedure Laterality Date   NO PAST SURGERIES  03/2019   Social History: Social History   Socioeconomic History   Marital status: Significant Other    Spouse name: Not on file   Number of children: Not on file   Years of education: Not on file   Highest education level: Not on file  Occupational History   Not on file  Tobacco Use   Smoking status: Every Day    Packs/day: 0.50    Types: Cigarettes    Start date: 09/03/2013    Last attempt to quit: 11/14/2017    Years since quitting: 4.7   Smokeless tobacco: Never   Tobacco comments:    stress at his job  Scientific laboratory technician Use: Never used  Substance and Sexual Activity   Alcohol use: Yes    Alcohol/week: 15.0 standard drinks of alcohol    Types: 15 Glasses of wine per week   Drug use: No   Sexual activity: Yes    Partners: Male    Birth control/protection: Condom    Comment: accepted condomos  Other Topics Concern   Not on file  Social History Narrative   Corporate investment banker, assist nursing with re-certifications, [employer].   Exercise - video programs.  Weight bearing exercise as well  03/2019   Social Determinants of Health   Financial Resource Strain: Not on file  Food Insecurity: Not on file  Transportation Needs: Not on file   Physical Activity: Not on file  Stress: Not on file  Social Connections: Not on file   Family History: Family History  Problem Relation Age of Onset   Hypertension Mother    Stroke Mother    Heart disease Father 24       died of MI   Diabetes Father    Hypertension Sister    Diabetes Sister        type 2   Heart disease Paternal Uncle        several uncles died in 46s with MI   Hypertension Paternal Uncle    Hypertension Maternal Grandmother    Stroke Maternal Grandmother    Hypertension Maternal Grandfather    Cancer Paternal Grandmother        lung   Heart disease Paternal Uncle        MI in 1s   Allergies: Allergies  Allergen Reactions   Shellfish Allergy Anaphylaxis   Medications: See med rec.  Review of Systems: No headache, visual changes, nausea, vomiting, diarrhea, constipation, dizziness, abdominal pain, skin rash, fevers, chills, night sweats, swollen lymph nodes, weight loss, chest pain, body aches, joint swelling, muscle aches, shortness of breath, mood changes, visual or auditory hallucinations.  Objective:    BP (!) 147/95  Pulse 76   Temp 97.7 F (36.5 C) (Oral)   Ht 6' (1.829 m)   Wt 267 lb (121.1 kg)   SpO2 100%   BMI 36.21 kg/m   General: Well Developed, well nourished, and in no acute distress. Neuro: Alert and oriented x3, extra-ocular muscles intact, sensation grossly intact. Cranial nerves II through XII are intact, motor, sensory, and coordinative functions are all intact. HEENT: Normocephalic, atraumatic, pupils equal round reactive to light, neck supple, no masses, no lymphadenopathy, thyroid nonpalpable. Oropharynx, nasopharynx, external ear canals are unremarkable. Skin: Warm and dry, no rashes noted. Cardiac: Regular rate and rhythm, no murmurs rubs or gallops. Respiratory: Clear to auscultation bilaterally. Not using accessory muscles, speaking in full sentences. Abdominal: Soft, nontender, nondistended, positive bowel sounds, no  masses, no organomegaly. Musculoskeletal: Shoulder, elbow, wrist, hip, knee, ankle stable, and with full range of motion.  Impression and Recommendations:    Wellness examination Routine HCM labs reviewed. HCM reviewed/discussed. Anticipatory guidance regarding healthy weight, lifestyle and choices given. Recommend healthy diet.  Recommend approximately 150 minutes/week of moderate intensity exercise Recommend regular dental and vision exams Always use seatbelt/lap and shoulder restraints Recommend using smoke alarms and checking batteries at least twice a year Recommend using sunscreen when outside Discussed tetanus immunization recommendations, patient is UTD Recommend seasonal flu vaccine, administered today  Obesity, Class II, BMI 35-39.9 Patient was able to start with Wegovy at 0.25 mg dose and did not have any issue with this dose of the medication.  Unfortunately, due to the nationwide shortage of lower dosages, he has not been able to obtain the 0.5 mg dose at local pharmacy.  We did discuss options today and at this time, he will continue with lifestyle modifications and he will check into other pharmacies locally to see if they do have the 0.5 mg dose available.  If he does find a pharmacy that has it available, he will let us know and we we will send a prescription to that pharmacy.  If he is not able to find 1 in the near term, we will look to resume medication once supply has become available  Return in about 3 months (around 11/26/2022) for PreDM, HTN.   ___________________________________________ Zigmond Trela de Peru, MD, ABFM, [facility] Primary Care and Sports Medicine [facility]

## 2022-08-26 NOTE — Patient Instructions (Signed)
  Medication Instructions:  Your physician recommends that you continue on your current medications as directed. Please refer to the Current Medication list given to you today. --If you need a refill on any your medications before your next appointment, please call your pharmacy first. If no refills are authorized on file call the office.-- Lab Work: Your physician has recommended that you have lab work today: No If you have labs (blood work) drawn today and your tests are completely normal, you will receive your results via MyChart message OR a phone call from our staff.  Please ensure you check your voicemail in the event that you authorized detailed messages to be left on a delegated number. If you have any lab test that is abnormal or we need to change your treatment, we will call you to review the results.  Referrals/Procedures/Imaging: No  Follow-Up: Your next appointment:   Your physician recommends that you schedule a follow-up appointment in: 2-3 months follow-up with Dr. de Cuba.  You will receive a text message or e-mail with a link to a survey about your care and experience with us today! We would greatly appreciate your feedback!   Thanks for letting us be apart of your health journey!!  Primary Care and Sports Medicine   Dr. Raymond de Cuba   We encourage you to activate your patient portal called "MyChart".  Sign up information is provided on this After Visit Summary.  MyChart is used to connect with patients for Virtual Visits (Telemedicine).  Patients are able to view lab/test results, encounter notes, upcoming appointments, etc.  Non-urgent messages can be sent to your provider as well. To learn more about what you can do with MyChart, please visit --  [email or web address].    

## 2022-08-26 NOTE — Assessment & Plan Note (Addendum)
Routine HCM labs reviewed. HCM reviewed/discussed. Anticipatory guidance regarding healthy weight, lifestyle and choices given. Recommend healthy diet.  Recommend approximately 150 minutes/week of moderate intensity exercise Recommend regular dental and vision exams Always use seatbelt/lap and shoulder restraints Recommend using smoke alarms and checking batteries at least twice a year Recommend using sunscreen when outside Discussed tetanus immunization recommendations, patient is UTD Recommend seasonal flu vaccine, administered today

## 2022-08-29 NOTE — Assessment & Plan Note (Signed)
Patient was able to start with Wegovy at 0.25 mg dose and did not have any issue with this dose of the medication.  Unfortunately, due to the nationwide shortage of lower dosages, he has not been able to obtain the 0.5 mg dose at local pharmacy.  We did discuss options today and at this time, he will continue with lifestyle modifications and he will check into other pharmacies locally to see if they do have the 0.5 mg dose available.  If he does find a pharmacy that has it available, he will let us know and we we will send a prescription to that pharmacy.  If he is not able to find 1 in the near term, we will look to resume medication once supply has become available

## 2022-09-02 ENCOUNTER — Ambulatory Visit: Payer: 59 | Admitting: Skilled Nursing Facility1

## 2022-09-04 ENCOUNTER — Other Ambulatory Visit (HOSPITAL_COMMUNITY): Payer: Self-pay

## 2022-09-10 ENCOUNTER — Other Ambulatory Visit (HOSPITAL_COMMUNITY): Payer: Self-pay

## 2022-09-16 ENCOUNTER — Other Ambulatory Visit (HOSPITAL_COMMUNITY): Payer: Self-pay

## 2022-09-30 ENCOUNTER — Other Ambulatory Visit (HOSPITAL_COMMUNITY): Payer: Self-pay

## 2022-10-03 ENCOUNTER — Encounter (HOSPITAL_COMMUNITY): Payer: Self-pay

## 2022-10-03 ENCOUNTER — Other Ambulatory Visit (HOSPITAL_COMMUNITY): Payer: Self-pay

## 2022-10-07 ENCOUNTER — Other Ambulatory Visit (HOSPITAL_COMMUNITY): Payer: Self-pay

## 2022-10-09 ENCOUNTER — Other Ambulatory Visit (HOSPITAL_COMMUNITY): Payer: Self-pay

## 2022-10-11 ENCOUNTER — Other Ambulatory Visit (HOSPITAL_COMMUNITY): Payer: Self-pay

## 2022-10-14 ENCOUNTER — Other Ambulatory Visit (HOSPITAL_COMMUNITY): Payer: Self-pay

## 2022-11-05 ENCOUNTER — Encounter (INDEPENDENT_AMBULATORY_CARE_PROVIDER_SITE_OTHER): Payer: Self-pay

## 2022-11-05 ENCOUNTER — Encounter (INDEPENDENT_AMBULATORY_CARE_PROVIDER_SITE_OTHER): Payer: 59 | Admitting: Family Medicine

## 2022-11-07 ENCOUNTER — Other Ambulatory Visit (HOSPITAL_COMMUNITY): Payer: Self-pay

## 2022-11-08 ENCOUNTER — Other Ambulatory Visit: Payer: Self-pay

## 2022-11-26 ENCOUNTER — Ambulatory Visit (HOSPITAL_BASED_OUTPATIENT_CLINIC_OR_DEPARTMENT_OTHER): Payer: 59 | Admitting: Family Medicine

## 2022-11-27 ENCOUNTER — Other Ambulatory Visit (HOSPITAL_COMMUNITY): Payer: Self-pay

## 2022-12-03 ENCOUNTER — Other Ambulatory Visit (HOSPITAL_COMMUNITY): Payer: Self-pay

## 2022-12-05 ENCOUNTER — Other Ambulatory Visit (HOSPITAL_COMMUNITY): Payer: Self-pay

## 2022-12-09 ENCOUNTER — Other Ambulatory Visit (HOSPITAL_COMMUNITY): Payer: Self-pay

## 2022-12-11 ENCOUNTER — Other Ambulatory Visit (HOSPITAL_COMMUNITY): Payer: Self-pay

## 2022-12-11 ENCOUNTER — Other Ambulatory Visit: Payer: Self-pay

## 2022-12-14 ENCOUNTER — Other Ambulatory Visit (HOSPITAL_COMMUNITY): Payer: Self-pay

## 2022-12-16 ENCOUNTER — Other Ambulatory Visit (HOSPITAL_COMMUNITY): Payer: Self-pay

## 2022-12-30 ENCOUNTER — Other Ambulatory Visit: Payer: Self-pay | Admitting: Family Medicine

## 2022-12-30 DIAGNOSIS — I10 Essential (primary) hypertension: Secondary | ICD-10-CM

## 2022-12-31 ENCOUNTER — Other Ambulatory Visit (HOSPITAL_BASED_OUTPATIENT_CLINIC_OR_DEPARTMENT_OTHER): Payer: Self-pay

## 2022-12-31 ENCOUNTER — Other Ambulatory Visit: Payer: Self-pay

## 2022-12-31 ENCOUNTER — Other Ambulatory Visit (HOSPITAL_COMMUNITY): Payer: Self-pay

## 2022-12-31 DIAGNOSIS — H5213 Myopia, bilateral: Secondary | ICD-10-CM | POA: Diagnosis not present

## 2022-12-31 MED ORDER — LOSARTAN POTASSIUM-HCTZ 100-25 MG PO TABS
1.0000 | ORAL_TABLET | Freq: Every day | ORAL | 1 refills | Status: DC
  Filled 2022-12-31: qty 15, 15d supply, fill #0
  Filled 2022-12-31: qty 75, 75d supply, fill #0
  Filled 2023-04-01: qty 90, 90d supply, fill #1

## 2023-01-01 ENCOUNTER — Encounter (HOSPITAL_BASED_OUTPATIENT_CLINIC_OR_DEPARTMENT_OTHER): Payer: Self-pay

## 2023-01-01 ENCOUNTER — Other Ambulatory Visit (HOSPITAL_BASED_OUTPATIENT_CLINIC_OR_DEPARTMENT_OTHER): Payer: Self-pay

## 2023-01-02 ENCOUNTER — Other Ambulatory Visit (HOSPITAL_BASED_OUTPATIENT_CLINIC_OR_DEPARTMENT_OTHER): Payer: Self-pay

## 2023-01-02 ENCOUNTER — Encounter (HOSPITAL_BASED_OUTPATIENT_CLINIC_OR_DEPARTMENT_OTHER): Payer: Self-pay | Admitting: Family Medicine

## 2023-01-02 ENCOUNTER — Other Ambulatory Visit (HOSPITAL_COMMUNITY): Payer: Self-pay

## 2023-01-02 ENCOUNTER — Other Ambulatory Visit: Payer: Self-pay

## 2023-01-03 ENCOUNTER — Other Ambulatory Visit (HOSPITAL_BASED_OUTPATIENT_CLINIC_OR_DEPARTMENT_OTHER): Payer: Self-pay

## 2023-01-06 ENCOUNTER — Other Ambulatory Visit (HOSPITAL_BASED_OUTPATIENT_CLINIC_OR_DEPARTMENT_OTHER): Payer: Self-pay

## 2023-01-07 ENCOUNTER — Other Ambulatory Visit (HOSPITAL_COMMUNITY): Payer: Self-pay

## 2023-01-08 ENCOUNTER — Other Ambulatory Visit (HOSPITAL_BASED_OUTPATIENT_CLINIC_OR_DEPARTMENT_OTHER): Payer: Self-pay

## 2023-01-09 ENCOUNTER — Other Ambulatory Visit (HOSPITAL_BASED_OUTPATIENT_CLINIC_OR_DEPARTMENT_OTHER): Payer: Self-pay

## 2023-01-09 ENCOUNTER — Telehealth: Payer: Self-pay | Admitting: Family Medicine

## 2023-01-09 ENCOUNTER — Other Ambulatory Visit (HOSPITAL_BASED_OUTPATIENT_CLINIC_OR_DEPARTMENT_OTHER): Payer: Self-pay | Admitting: Family Medicine

## 2023-01-09 DIAGNOSIS — E669 Obesity, unspecified: Secondary | ICD-10-CM

## 2023-01-09 MED ORDER — WEGOVY 0.25 MG/0.5ML ~~LOC~~ SOAJ
0.2500 mg | SUBCUTANEOUS | 0 refills | Status: DC
  Filled 2023-01-09 – 2023-01-31 (×3): qty 2, 28d supply, fill #0

## 2023-01-09 NOTE — Telephone Encounter (Signed)
Patient is getting wigoovy and is asking for a higher dosage. The pharmacy wants to know what to do at this time?

## 2023-01-13 ENCOUNTER — Ambulatory Visit (HOSPITAL_BASED_OUTPATIENT_CLINIC_OR_DEPARTMENT_OTHER): Payer: Commercial Managed Care - PPO | Admitting: Family Medicine

## 2023-01-18 ENCOUNTER — Other Ambulatory Visit (HOSPITAL_BASED_OUTPATIENT_CLINIC_OR_DEPARTMENT_OTHER): Payer: Self-pay

## 2023-01-27 ENCOUNTER — Other Ambulatory Visit (HOSPITAL_COMMUNITY): Payer: Self-pay

## 2023-01-31 ENCOUNTER — Other Ambulatory Visit (HOSPITAL_BASED_OUTPATIENT_CLINIC_OR_DEPARTMENT_OTHER): Payer: Self-pay

## 2023-01-31 ENCOUNTER — Other Ambulatory Visit (HOSPITAL_COMMUNITY): Payer: Self-pay

## 2023-02-10 ENCOUNTER — Encounter (INDEPENDENT_AMBULATORY_CARE_PROVIDER_SITE_OTHER): Payer: Self-pay | Admitting: Internal Medicine

## 2023-02-10 ENCOUNTER — Ambulatory Visit (INDEPENDENT_AMBULATORY_CARE_PROVIDER_SITE_OTHER): Payer: Commercial Managed Care - PPO | Admitting: Internal Medicine

## 2023-02-10 ENCOUNTER — Ambulatory Visit (HOSPITAL_BASED_OUTPATIENT_CLINIC_OR_DEPARTMENT_OTHER): Payer: Commercial Managed Care - PPO | Admitting: Family Medicine

## 2023-02-10 VITALS — BP 132/84 | HR 71 | Temp 98.4°F | Ht 73.0 in | Wt 271.0 lb

## 2023-02-10 DIAGNOSIS — Z6835 Body mass index (BMI) 35.0-35.9, adult: Secondary | ICD-10-CM | POA: Insufficient documentation

## 2023-02-10 DIAGNOSIS — E7800 Pure hypercholesterolemia, unspecified: Secondary | ICD-10-CM

## 2023-02-10 DIAGNOSIS — B20 Human immunodeficiency virus [HIV] disease: Secondary | ICD-10-CM

## 2023-02-10 DIAGNOSIS — R7303 Prediabetes: Secondary | ICD-10-CM | POA: Diagnosis not present

## 2023-02-10 DIAGNOSIS — E66812 Obesity, class 2: Secondary | ICD-10-CM | POA: Insufficient documentation

## 2023-02-10 DIAGNOSIS — I10 Essential (primary) hypertension: Secondary | ICD-10-CM | POA: Diagnosis not present

## 2023-02-10 NOTE — Assessment & Plan Note (Signed)
Blood pressure not at goal for age and risk category.  On Hyzaar 100/25 without adverse effects.  Most recent renal parameters reviewed which showed normal electrolytes and kidney function.  Losing 10% of body weight may improve condition.  We will check renal parameters with intake labs

## 2023-02-10 NOTE — Assessment & Plan Note (Signed)
LDL is not at goal. Elevated LDL may be secondary to nutrition, genetics and spillover effect from excess adiposity.  Also could be related to antiretroviral therapy.  Recommended LDL goal is <70 to reduce the risk of fatty streaks and the progression to obstructive ASCVD in the future. His 10 year risk is: The 10-year ASCVD risk score (Arnett DK, et al., 2019) is: 9.8%  Lab Results  Component Value Date   CHOL 162 08/19/2022   HDL 41 08/19/2022   LDLCALC 93 08/19/2022   TRIG 158 (H) 08/19/2022   CHOLHDL 4.0 08/19/2022    Patient will benefit from statin therapy in addition to nutritional strategies to reduce LDL cholesterol.

## 2023-02-10 NOTE — Assessment & Plan Note (Signed)
Patient reports having an undetectable viral load and a CD4 count in the stable range.  She is currently on Biktarvy and has been on this regimen before 5 years with good tolerance and adherence.  He was made aware that HIV is a chronic inflammatory state and therefore increases his risk for cardiovascular disease at a young age we therefore need to be aggressive about his cardiovascular risk.  His blood pressures is above target.  He also has an elevated LDL cholesterol and he has prediabetes all of these conditions increases cardiovascular risk.

## 2023-02-10 NOTE — Assessment & Plan Note (Signed)
We reviewed weight, biometrics, associated medical conditions and contributing factors with patient. She would benefit from weight loss therapy via a modified calorie, low-carb, high-protein nutritional plan tailored to their REE (resting energy expenditure) which will be determined by indirect calorimetry.  We will also assess for cardiometabolic risk and nutritional derangements via fasting serologies at her next appointment. 

## 2023-02-10 NOTE — Assessment & Plan Note (Signed)
Most recent A1c is  Lab Results  Component Value Date   HGBA1C 6.0 (H) 08/19/2022  . Patient informed of disease state and risk of progression. This may contribute to abnormal cravings, fatigue and diabetes complications without having diabetes.   We reviewed treatment options which include losing 7 to 10% of body weight, increasing physical activity to a 150 minutes a week of moderate intensity.He may also be a candidate for pharmacoprophylaxis with metformin or incretin mimetic.

## 2023-02-10 NOTE — Progress Notes (Signed)
Office: [phone]  /  Fax: [phone]   Initial Visit  Douglas [name] was seen in clinic today to evaluate for obesity. He is interested in losing weight to improve overall health and reduce the risk of weight related complications. He presents today to review program treatment options, initial physical assessment, and evaluation.   He is currently on Wegovy started by his PCP but has not been taking due to concerns with supply chain problems causing disruption in titration.  He was referred by: Friend or Family  When asked what else they would like to accomplish? He states: Improve energy levels and physical activity, Improve existing medical conditions, Improve quality of life, and Improve appearance  Weight history: had childhood  When asked how has your weight affected you? He states: Contributed to medical problems and Problems with eating patterns  Some associated conditions: Hypertension, Hyperlipidemia, and Prediabetes  Contributing factors: Family history, Nutritional, Medications, Stress, Reduced physical activity, and Eating patterns  Weight promoting medications identified: Other: HAART  Current nutrition plan: None  Current level of physical activity: Walking, Running / Jogging, Strength training, and Other: inconsistent  Current or previous pharmacotherapy: GLP-1  Response to medication: Other: Wegovy .25 mg a week   Past medical history includes:   Past Medical History:  Diagnosis Date   Family history of heart disease in male family member before age 82    HIV infection ([place]) 2014   Hypertension 2004   Low HDL (under 40)    Seasonal allergic rhinitis    weather changes, spring/fall   Sickle cell trait (HCC)      Objective:   BP 132/84   Pulse 71   Temp 98.4 F (36.9 C)   Ht 6\' 1"  (1.854 m)   Wt 271 lb (122.9 kg)   SpO2 96%   BMI 35.75 kg/m  He was weighed on the bioimpedance scale: Body mass index is 35.75 kg/m.  Peak Weight:271    General:  Alert, oriented and cooperative. Patient is in no acute distress.  Respiratory: Normal respiratory effort, no problems with respiration noted   Gait: able to ambulate independently  Mental Status: Normal mood and affect. Normal behavior. Normal judgment and thought content.   DIAGNOSTIC DATA REVIEWED:  BMET    Component Value Date/Time   NA 140 06/20/2022 1539   NA 137 09/05/2020 1523   K 3.8 06/20/2022 1539   CL 104 06/20/2022 1539   CO2 27 06/20/2022 1539   GLUCOSE 97 06/20/2022 1539   BUN 16 06/20/2022 1539   BUN 15 09/05/2020 1523   CREATININE 1.07 06/20/2022 1539   CALCIUM 9.6 06/20/2022 1539   GFRNONAA 86 01/01/2021 1123   GFRAA 100 01/01/2021 1123   Lab Results  Component Value Date   HGBA1C 6.0 (H) 08/19/2022   HGBA1C 5.7 (H) 04/14/2019   No results found for: "INSULIN" CBC    Component Value Date/Time   WBC 4.5 06/20/2022 1539   RBC 5.10 06/20/2022 1539   HGB 14.5 06/20/2022 1539   HGB 14.2 06/08/2019 1001   HCT 43.9 06/20/2022 1539   HCT 44.9 06/08/2019 1001   PLT 216 06/20/2022 1539   PLT 246 06/08/2019 1001   MCV 86.1 06/20/2022 1539   MCV 87 06/08/2019 1001   MCH 28.4 06/20/2022 1539   MCHC 33.0 06/20/2022 1539   RDW 13.5 06/20/2022 1539   RDW 13.6 06/08/2019 1001   Iron/TIBC/Ferritin/ %Sat No results found for: "IRON", "TIBC", "FERRITIN", "IRONPCTSAT" Lipid Panel  Component Value Date/Time   CHOL 162 08/19/2022 1310   TRIG 158 (H) 08/19/2022 1310   HDL 41 08/19/2022 1310   CHOLHDL 4.0 08/19/2022 1310   CHOLHDL 4.6 12/16/2019 0904   VLDL 14 01/02/2017 1359   LDLCALC 93 08/19/2022 1310   LDLCALC 135 (H) 12/16/2019 0904   Hepatic Function Panel     Component Value Date/Time   PROT 7.0 06/20/2022 1539   PROT 7.2 09/15/2018 1400   ALBUMIN 4.6 09/15/2018 1400   AST 16 06/20/2022 1539   ALT 29 06/20/2022 1539   ALKPHOS 52 09/15/2018 1400   BILITOT 0.6 06/20/2022 1539   BILITOT 0.4 09/15/2018 1400      Component Value  Date/Time   TSH 2.500 08/19/2022 1310   TSH 1.260 06/08/2019 1001     Assessment and Plan:   Essential hypertension Assessment & Plan: Blood pressure not at goal for age and risk category.  On Hyzaar 100/25 without adverse effects.  Most recent renal parameters reviewed which showed normal electrolytes and kidney function.  Losing 10% of body weight may improve condition.  We will check renal parameters with intake labs    Prediabetes Assessment & Plan: Most recent A1c is  Lab Results  Component Value Date   HGBA1C 6.0 (H) 08/19/2022  . Patient informed of disease state and risk of progression. This may contribute to abnormal cravings, fatigue and diabetes complications without having diabetes.   We reviewed treatment options which include losing 7 to 10% of body weight, increasing physical activity to a 150 minutes a week of moderate intensity.He may also be a candidate for pharmacoprophylaxis with metformin or incretin mimetic.     Pure hypercholesterolemia Assessment & Plan: LDL is not at goal. Elevated LDL may be secondary to nutrition, genetics and spillover effect from excess adiposity.  Also could be related to antiretroviral therapy.  Recommended LDL goal is <70 to reduce the risk of fatty streaks and the progression to obstructive ASCVD in the future. His 10 year risk is: The 10-year ASCVD risk score (Arnett DK, et al., 2019) is: 9.8%  Lab Results  Component Value Date   CHOL 162 08/19/2022   HDL 41 08/19/2022   LDLCALC 93 08/19/2022   TRIG 158 (H) 08/19/2022   CHOLHDL 4.0 08/19/2022    Patient will benefit from statin therapy in addition to nutritional strategies to reduce LDL cholesterol.      Human immunodeficiency virus (HIV) disease ([place]) Assessment & Plan: Patient reports having an undetectable viral load and a CD4 count in the stable range.  She is currently on Biktarvy and has been on this regimen before 5 years with good tolerance and adherence.  He  was made aware that HIV is a chronic inflammatory state and therefore increases his risk for cardiovascular disease at a young age we therefore need to be aggressive about his cardiovascular risk.  His blood pressures is above target.  He also has an elevated LDL cholesterol and he has prediabetes all of these conditions increases cardiovascular risk.   Class 2 severe obesity with serious comorbidity and body mass index (BMI) of 35.0 to 35.9 in adult, unspecified obesity type [facility]) Assessment & Plan: We reviewed weight, biometrics, associated medical conditions and contributing factors with patient. She would benefit from weight loss therapy via a modified calorie, low-carb, high-protein nutritional plan tailored to their REE (resting energy expenditure) which will be determined by indirect calorimetry.  We will also assess for cardiometabolic risk and nutritional derangements via fasting  serologies at her next appointment.         Obesity Treatment / Action Plan:  Patient will work on garnering support from family and friends to begin weight loss journey. Will work on eliminating or reducing the presence of highly palatable, calorie dense foods in the home. Will complete provided nutritional and psychosocial assessment questionnaire before the next appointment. Will be scheduled for indirect calorimetry to determine resting energy expenditure in a fasting state.  This will allow [place] to create a reduced calorie, high-protein meal plan to promote loss of fat mass while preserving muscle mass. Counseled on the health benefits of losing 5%-15% of total body weight. Was counseled on nutritional approaches to weight loss and benefits of complex carbs and high quality protein as part of nutritional weight management. Was counseled on pharmacotherapy and role as an adjunct in weight management.   Obesity Education Performed Today:  He was weighed on the bioimpedance scale and results were discussed  and documented in the synopsis.  We discussed obesity as a disease and the importance of a more detailed evaluation of all the factors contributing to the disease.  We discussed the importance of long term lifestyle changes which include nutrition, exercise and behavioral modifications as well as the importance of customizing this to his specific health and social needs.  We discussed the benefits of reaching a healthier weight to alleviate the symptoms of existing conditions and reduce the risks of the biomechanical, metabolic and psychological effects of obesity.  TYSHAN ZINGSHEIM appears to be in the action stage of change and states they are ready to start intensive lifestyle modifications and behavioral modifications.  30 minutes was spent today on this visit including the above counseling, pre-visit chart review, and post-visit documentation.  Reviewed by clinician on day of visit: allergies, medications, problem list, medical history, surgical history, family history, social history, and previous encounter notes pertinent to obesity diagnosis.   Thomes Dinning, MD

## 2023-02-11 ENCOUNTER — Other Ambulatory Visit: Payer: Self-pay

## 2023-02-11 NOTE — Progress Notes (Signed)
Patient outreached by Georga Kaufmann, PharmD Candidate on 02/11/23 to discuss hypertension    Patient has an automated home blood pressure machine. Patient has not been monitoring BP at home. I educated him on the importance of monitoring his BP at home and he stated he would start checking at home.   Medication review was performed. They are taking medications as prescribed.   The following barriers to adherence were noted:  - They do not have cost concerns.  - They do not have transportation concerns.  - They do not need assistance obtaining refills.  - They do not occasionally forget to take some of their prescribed medications.  - They do not feel like one/some of their medications make them feel poorly.  - They do not have questions or concerns about their medications.  - They do have follow up scheduled with their primary care provider/cardiologist.   The following interventions were completed:  - Medications were reviewed  - Patient was educated on goal blood pressures and long term health implications of elevated blood pressure  - Patient was educated on proper technique to check home blood pressure and reminded to bring home machine and readings to next provider appointment  - Patient was educated on medications, including indication and administration  - Patient was counseled on lifestyle modifications to improve blood pressure, including diet modification and physical activity   The patient has follow up scheduled: 03/06/23  PCP: Minette Brine, [name]    Georga Kaufmann, PharmD Candidate      Maryan Puls, PharmD PGY-1 [facility] Pharmacy Resident

## 2023-02-19 ENCOUNTER — Other Ambulatory Visit (HOSPITAL_COMMUNITY): Payer: Self-pay

## 2023-02-25 ENCOUNTER — Other Ambulatory Visit (HOSPITAL_COMMUNITY): Payer: Self-pay

## 2023-02-25 ENCOUNTER — Ambulatory Visit (INDEPENDENT_AMBULATORY_CARE_PROVIDER_SITE_OTHER): Payer: Commercial Managed Care - PPO | Admitting: Internal Medicine

## 2023-02-28 ENCOUNTER — Other Ambulatory Visit (HOSPITAL_COMMUNITY): Payer: Self-pay

## 2023-03-03 ENCOUNTER — Other Ambulatory Visit: Payer: Self-pay

## 2023-03-06 ENCOUNTER — Encounter: Payer: Self-pay | Admitting: Nurse Practitioner

## 2023-03-06 ENCOUNTER — Other Ambulatory Visit (HOSPITAL_COMMUNITY): Payer: Self-pay

## 2023-03-06 ENCOUNTER — Ambulatory Visit: Payer: Commercial Managed Care - PPO | Admitting: Nurse Practitioner

## 2023-03-06 VITALS — BP 140/84 | HR 71 | Temp 98.4°F | Ht 73.0 in | Wt 274.6 lb

## 2023-03-06 DIAGNOSIS — R0683 Snoring: Secondary | ICD-10-CM

## 2023-03-06 DIAGNOSIS — Z6836 Body mass index (BMI) 36.0-36.9, adult: Secondary | ICD-10-CM

## 2023-03-06 DIAGNOSIS — Z2821 Immunization not carried out because of patient refusal: Secondary | ICD-10-CM | POA: Diagnosis not present

## 2023-03-06 DIAGNOSIS — R7303 Prediabetes: Secondary | ICD-10-CM | POA: Diagnosis not present

## 2023-03-06 DIAGNOSIS — Z720 Tobacco use: Secondary | ICD-10-CM

## 2023-03-06 DIAGNOSIS — I10 Essential (primary) hypertension: Secondary | ICD-10-CM

## 2023-03-06 DIAGNOSIS — B20 Human immunodeficiency virus [HIV] disease: Secondary | ICD-10-CM | POA: Diagnosis not present

## 2023-03-06 DIAGNOSIS — F1010 Alcohol abuse, uncomplicated: Secondary | ICD-10-CM

## 2023-03-06 DIAGNOSIS — Z7689 Persons encountering health services in other specified circumstances: Secondary | ICD-10-CM

## 2023-03-06 DIAGNOSIS — E669 Obesity, unspecified: Secondary | ICD-10-CM

## 2023-03-06 DIAGNOSIS — E7800 Pure hypercholesterolemia, unspecified: Secondary | ICD-10-CM | POA: Diagnosis not present

## 2023-03-06 MED ORDER — AMLODIPINE BESYLATE 2.5 MG PO TABS
2.5000 mg | ORAL_TABLET | Freq: Every day | ORAL | 11 refills | Status: DC
  Filled 2023-03-06: qty 30, 30d supply, fill #0
  Filled 2023-04-01: qty 30, 30d supply, fill #1

## 2023-03-06 NOTE — Patient Instructions (Addendum)
Nice to meet you!   I encourage you to cut back on drinking alcohol and to make realistic changes to your diet by cutting back over time. Increase your physical activity to at least 150 minutes per week.

## 2023-03-06 NOTE — Progress Notes (Signed)
Hershal Coria Martin,acting as a Neurosurgeon for [name], [name].,have documented all relevant documentation on the behalf of [name], [name],as directed by  [name], [name] while in the presence of [name], [name].    Subjective:     Patient ID: Douglas [name] , male    DOB: 1980/12/25 , 42 y.o.   MRN: [id number]   Chief Complaint  Patient presents with   Establish Care    HPI  Patient presents today to establish care. He was seeing Dr. Ihor Dow last year.  Works at [employer] with [employer]. Single. No children.    patient has a diagnoses of hypertension (takes his medication at night about 830) - 167/100, 132/90. He thinks he has had HTN for 18 years was on lisinopril started to have a cough then took losartan. When his sister passed from complications of diabetes he had difficulty adjusting but has been okay for several years, never took any medications. HIV - Dx 2016 - he is being followed by infectious disease.    Patient would like to discuss weight loss option considering his insurance will not cover his wegovy.   He has had a full cardiac work up due to early family history of cardiac history.   BP Readings from Last 3 Encounters: 03/06/23 : (!) 140/90 02/10/23 : 132/84 08/26/22 : (!) 147/95       Past Medical History:  Diagnosis Date   Anxiety    Family history of heart disease in male family member before age 68    HIV infection (HCC) 2014   Hypertension 2004   Low HDL (under 40)    Seasonal allergic rhinitis    weather changes, spring/fall   Sickle cell trait (HCC)      Family History  Problem Relation Age of Onset   Diabetes Mother    Hyperlipidemia Mother    Hypertension Mother    Stroke Mother    Heart disease Father 97       died of MI   Diabetes Father    Hypertension Sister    Diabetes Sister        type 2   Heart disease Paternal Uncle        several uncles died in 74s with MI   Hypertension Paternal Uncle    Heart disease  Paternal Uncle        MI in 55s   Hypertension Maternal Grandmother    Stroke Maternal Grandmother    Hypertension Maternal Grandfather    Cancer Paternal Grandmother        lung     Current Outpatient Medications:    amLODipine (NORVASC) 2.5 MG tablet, Take 1 tablet (2.5 mg total) by mouth daily., Disp: 30 tablet, Rfl: 11   bictegravir-emtricitabine-tenofovir AF (BIKTARVY) 50-200-25 MG TABS tablet, TAKE 1 TABLET BY MOUTH 1 TIME A DAY., Disp: 30 tablet, Rfl: 11   losartan-hydrochlorothiazide (HYZAAR) 100-25 MG tablet, Take 1 tablet by mouth daily., Disp: 90 tablet, Rfl: 1   WEGOVY 0.25 MG/0.5ML SOAJ, Inject 0.25 mg into the skin once a week. Use this dose for 1 month (4 shots) and then increase to next higher dose., Disp: 2 mL, Rfl: 0   Allergies  Allergen Reactions   Shellfish Allergy Anaphylaxis     Review of Systems  Constitutional: Negative.   HENT: Negative.    Eyes: Negative.   Respiratory: Negative.    Cardiovascular: Negative.   Gastrointestinal: Negative.   Neurological: Negative.   Psychiatric/Behavioral: Negative.  Today's Vitals   03/06/23 1501 03/06/23 1601  BP: (!) 140/90 (!) 140/84  Pulse: 71   Temp: 98.4 F (36.9 C)   TempSrc: Oral   Weight: 274 lb 9.6 oz (124.6 kg)   Height: 6\' 1"  (1.854 m)   PainSc: 0-No pain    Body mass index is 36.23 kg/m.  Wt Readings from Last 3 Encounters:  03/06/23 274 lb 9.6 oz (124.6 kg)  02/10/23 271 lb (122.9 kg)  08/26/22 267 lb (121.1 kg)    Objective:  Physical Exam Vitals reviewed.  Constitutional:      General: He is not in acute distress.    Appearance: Normal appearance. He is obese. He is not ill-appearing.  Cardiovascular:     Pulses: Normal pulses.     Heart sounds: Normal heart sounds. No murmur heard. Pulmonary:     Effort: Pulmonary effort is normal. No respiratory distress.     Breath sounds: Normal breath sounds.  Neurological:     Mental Status: He is alert.         Assessment And  Plan:     1. Essential hypertension Comments: Blood pressure is elevated.  Will work on low-dose amlodipine discussed side effects to them to lower extremities.  Limit intake of high salt foods - BMP8+eGFR - amLODipine (NORVASC) 2.5 MG tablet; Take 1 tablet (2.5 mg total) by mouth daily.  Dispense: 30 tablet; Refill: 11  2. Pure hypercholesterolemia Comments: Will check close blood levels.  Advised to eat a low-fat diet. - Lipid panel  3. Prediabetes Comments: Will check hemoglobin A1c.  Not currently on any medications. - Hemoglobin A1c  4. Snoring Comments: Will refer for sleep study.  Discussed risk factors related to sleep apnea to include heart events and respiratory depression. - Ambulatory referral to Sleep Studies - CBC  5. Human immunodeficiency virus (HIV) disease (HCC) Comments: Continue follow-up with infectious disease.  6. Tobacco abuse Smoking cessation instruction/counseling given:  counseled patient on the dangers of tobacco use, advised patient to stop smoking, and reviewed strategies to maximize success  7. Alcohol abuse Comments: Discussed him to quit seeing drink alcohol he is advised to wean down slowly  8. COVID-19 vaccination declined Declines covid 19 vaccine. Discussed risk of covid 22 and if he changes her mind about the vaccine to call the office. Education has been provided regarding the importance of this vaccine but patient still declined. Advised may receive this vaccine at local pharmacy or Health Dept.or vaccine clinic. Aware to provide a copy of the vaccination record if obtained from local pharmacy or Health Dept.  Encouraged to take multivitamin, vitamin d, vitamin c and zinc to increase immune system. Aware can call office if would like to have vaccine here at office. Verbalized acceptance and understanding.  9. Obesity, Class II, BMI 35-39.9 She is encouraged to strive for BMI less than 30 to decrease cardiac risk. Advised to aim for at least  150 minutes of exercise per week.  - TSH - Insulin, random - Ambulatory referral to Sleep Studies  10. Establishing care with new doctor, encounter for Patient is here to establish care. Went over patient medical, family, social and surgical history. Reviewed with patient their medications and any allergies  Reviewed with patient their sexual orientation, drug/tobacco and alcohol use Dicussed any new concerns with patient  recommended patient comes in for a physical exam and complete blood work.  Educated patient about the importance of annual screenings and immunizations.  Advised patient to eat  a healthy diet along with exercise for atleast 30-45 min atleast 4-5 days of the week.     Patient was given opportunity to ask questions. Patient verbalized understanding of the plan and was able to repeat key elements of the plan. All questions were answered to their satisfaction.  Arnette FeltsJanece Leonidas Boateng, [name]   I, Arnette FeltsJanece Tina Temme, [name], have reviewed all documentation for this visit. The documentation on 03/06/23 for the exam, diagnosis, procedures, and orders are all accurate and complete.    IF YOU HAVE BEEN REFERRED TO A SPECIALIST, IT MAY TAKE 1-2 WEEKS TO SCHEDULE/PROCESS THE REFERRAL. IF YOU HAVE NOT HEARD FROM US/SPECIALIST IN TWO WEEKS, PLEASE GIVE US A CALL AT [phone].   THE PATIENT IS ENCOURAGED TO PRACTICE SOCIAL DISTANCING DUE TO THE COVID-19 PANDEMIC.

## 2023-03-07 LAB — LIPID PANEL
Chol/HDL Ratio: 4.6 ratio (ref 0.0–5.0)
Cholesterol, Total: 195 mg/dL (ref 100–199)
HDL: 42 mg/dL (ref >39)
LDL Chol Calc (NIH): 103 mg/dL — ABNORMAL HIGH (ref 0–99)
Triglycerides: 292 mg/dL — ABNORMAL HIGH (ref 0–149)
VLDL Cholesterol Cal: 50 mg/dL — ABNORMAL HIGH (ref 5–40)

## 2023-03-07 LAB — CBC
Hematocrit: 44.4 % (ref 37.5–51.0)
Hemoglobin: 15 g/dL (ref 13.0–17.7)
MCH: 28.5 pg (ref 26.6–33.0)
MCHC: 33.8 g/dL (ref 31.5–35.7)
MCV: 84 fL (ref 79–97)
Platelets: 247 10*3/uL (ref 150–450)
RBC: 5.26 x10E6/uL (ref 4.14–5.80)
RDW: 13.9 % (ref 11.6–15.4)
WBC: 6.2 10*3/uL (ref 3.4–10.8)

## 2023-03-07 LAB — BMP8+EGFR
BUN/Creatinine Ratio: 17 (ref 9–20)
BUN: 22 mg/dL (ref 6–24)
CO2: 21 mmol/L (ref 20–29)
Calcium: 10.2 mg/dL (ref 8.7–10.2)
Chloride: 100 mmol/L (ref 96–106)
Creatinine, Ser: 1.26 mg/dL (ref 0.76–1.27)
Glucose: 92 mg/dL (ref 70–99)
Potassium: 3.8 mmol/L (ref 3.5–5.2)
Sodium: 139 mmol/L (ref 134–144)
eGFR: 73 mL/min/{1.73_m2} (ref >59)

## 2023-03-07 LAB — HEMOGLOBIN A1C
Est. average glucose Bld gHb Est-mCnc: 126 mg/dL
Hgb A1c MFr Bld: 6 % — ABNORMAL HIGH (ref 4.8–5.6)

## 2023-03-07 LAB — INSULIN, RANDOM: INSULIN: 63.5 u[IU]/mL — ABNORMAL HIGH (ref 2.6–24.9)

## 2023-03-07 LAB — TSH: TSH: 1.23 u[IU]/mL (ref 0.450–4.500)

## 2023-03-12 ENCOUNTER — Encounter: Payer: Self-pay | Admitting: Nurse Practitioner

## 2023-03-27 ENCOUNTER — Other Ambulatory Visit (HOSPITAL_COMMUNITY): Payer: Self-pay

## 2023-03-31 ENCOUNTER — Other Ambulatory Visit: Payer: Self-pay

## 2023-03-31 ENCOUNTER — Other Ambulatory Visit (HOSPITAL_COMMUNITY): Payer: Self-pay

## 2023-04-01 ENCOUNTER — Other Ambulatory Visit (HOSPITAL_COMMUNITY): Payer: Self-pay

## 2023-04-01 ENCOUNTER — Other Ambulatory Visit: Payer: Self-pay

## 2023-04-02 ENCOUNTER — Other Ambulatory Visit (HOSPITAL_COMMUNITY): Payer: Self-pay

## 2023-04-03 ENCOUNTER — Other Ambulatory Visit (HOSPITAL_COMMUNITY): Payer: Self-pay

## 2023-04-03 DIAGNOSIS — Z0289 Encounter for other administrative examinations: Secondary | ICD-10-CM

## 2023-04-04 ENCOUNTER — Other Ambulatory Visit (HOSPITAL_COMMUNITY): Payer: Self-pay

## 2023-04-04 ENCOUNTER — Encounter: Payer: Self-pay | Admitting: Nurse Practitioner

## 2023-04-04 ENCOUNTER — Other Ambulatory Visit: Payer: Self-pay

## 2023-04-04 DIAGNOSIS — B20 Human immunodeficiency virus [HIV] disease: Secondary | ICD-10-CM

## 2023-04-04 MED ORDER — BIKTARVY 50 MG-200 MG-25 MG TABLET
ORAL_TABLET | Freq: Every day | ORAL | 3 refills | 30 days
Start: 2023-04-04 — End: ?

## 2023-04-04 MED ORDER — BIKTARVY 50-200-25 MG PO TABS: ORAL_TABLET | ORAL | 3 refills | Status: DC

## 2023-04-08 ENCOUNTER — Other Ambulatory Visit: Payer: Self-pay

## 2023-04-08 DIAGNOSIS — B20 Human immunodeficiency virus [HIV] disease: Secondary | ICD-10-CM

## 2023-04-08 DIAGNOSIS — I10 Essential (primary) hypertension: Secondary | ICD-10-CM

## 2023-04-08 DIAGNOSIS — E669 Obesity, unspecified: Secondary | ICD-10-CM

## 2023-04-08 MED ORDER — BIKTARVY 50 MG-200 MG-25 MG TABLET
ORAL_TABLET | Freq: Every day | ORAL | 3 refills | 30 days
Start: 2023-04-08 — End: ?

## 2023-04-08 MED ORDER — AMLODIPINE 2.5 MG TABLET
ORAL_TABLET | Freq: Every day | ORAL | 11 refills | 30 days
Start: 2023-04-08 — End: ?

## 2023-04-08 MED ORDER — WEGOVY 0.25 MG/0.5 ML SUBCUTANEOUS PEN INJECTOR
SUBCUTANEOUS | 0 refills | 0 days
Start: 2023-04-08 — End: ?

## 2023-04-08 MED ORDER — LOSARTAN 100 MG-HYDROCHLOROTHIAZIDE 25 MG TABLET
ORAL_TABLET | Freq: Every day | ORAL | 1 refills | 90 days
Start: 2023-04-08 — End: ?

## 2023-04-08 MED ORDER — WEGOVY 0.25 MG/0.5ML ~~LOC~~ SOAJ: 0.2500 mg | SUBCUTANEOUS | 0 refills | Status: DC

## 2023-04-08 MED ORDER — AMLODIPINE BESYLATE 2.5 MG PO TABS: 2.5000 mg | ORAL_TABLET | Freq: Every day | ORAL | 11 refills | Status: DC

## 2023-04-08 MED ORDER — BIKTARVY 50-200-25 MG PO TABS
ORAL_TABLET | ORAL | 3 refills | Status: DC
Start: 2023-04-08 — End: 2023-12-04

## 2023-04-08 MED ORDER — LOSARTAN POTASSIUM-HCTZ 100-25 MG PO TABS
1.0000 | ORAL_TABLET | Freq: Every day | ORAL | 1 refills | Status: DC
Start: 2023-04-08 — End: 2023-10-27

## 2023-04-10 NOTE — Unmapped (Signed)
[facility] Specialty Medication Onboarding    Specialty Medication: BIKTARVY 50-200-25 mg tablet (bictegrav-emtricit-tenofov ala)  Prior Authorization: Not Required   Financial Assistance: Yes - copay card approved as secondary   Final Copay/Day Supply: $0 / 30    Insurance Restrictions: None     Notes to Pharmacist:   Credit Card on File: not applicable    The triage team has completed the benefits investigation and has determined that the patient is able to fill this medication at [facility]. Please contact the patient to complete the onboarding or follow up with the prescribing physician as needed.

## 2023-04-10 NOTE — Unmapped (Signed)
[facility]   Patient Onboarding/Medication Counseling    Brandon Solis is a 42 y.o. male with HIV who I am counseling today on continuation of therapy.  I am speaking to the patient.    Was a Nurse, learning disability used for this call? No    Verified patient's date of birth / HIPAA.    Specialty medication(s) to be sent: Infectious Disease: Biktarvy      Non-specialty medications/supplies to be sent:   Amlodipine 2.5mg   Losartan-hydrochlorothiazide 100-25mg       Medications not needed at this time: n/a         Biktarvy (bictegravir, emtricitabine, and tenofovir alafenamide) 50-200-25mg     The patient declined counseling on medication administration, missed dose instructions, goals of therapy, side effects and monitoring parameters, warnings and precautions, drug/food interactions, and storage, handling precautions, and disposal because they have taken the medication previously. The information in the declined sections below are for informational purposes only and was not discussed with patient.       Medication & Administration     Dosage: Take 1 tablet by mouth daily    Administration: Take without regard to food    Adherence/Missed dose instructions: take missed dose as soon as you remember. If it is close to the time of your next dose, skip the dose and resume with your next scheduled dose.    Goals of Therapy     To suppress viral replication and keep patient's HIV undetectable by lab tests    Side Effects & Monitoring Parameters     Common Side Effects:  Diarrhea  Upset stomach  Headache  Changes in Weight  Changes in mood    The following side effects should be reported to the provider:     If patient experiences: signs of an allergic reaction (rash; hives; itching; red, swollen, blistered, or peeling skin with or without fever; wheezing; tightness in the chest or throat; trouble breathing, swallowing, or talking; unusual hoarseness; or swelling of the mouth, face, lips, tongue, or throat)  signs of kidney problems (unable to pass urine, change in how much urine is passed, blood in the urine, or a big weight gain)  signs of liver problems (dark urine, feeling tired, not hungry, upset stomach or stomach pain, light-colored stools, throwing up, or yellow skin or eyes)  signs of lactic acidosis (fast breathing, fast heartbeat, a heartbeat that does not feel normal, very bad upset stomach or throwing up, feeling very sleepy, shortness of breath, feeling very tired or weak, very bad dizziness, feeling cold, or muscle pain or cramps)  Weight gain: some patients have reported weight gain after starting this medication. The amount of weight can vary.    Monitoring Parameters:  CD4  Count  HIV RNA plasma levels,  Liver function  Total bilirubin  serum creatinine  urine glucose  urine protein (prior to or when initiating therapy and as clinically indicated during therapy);       Drug/Food Interactions     Medication list reviewed in Epic. The patient was instructed to inform the care team before taking any new medications or supplements. No drug interactions identified.   Calcium Salts: May decrease the serum concentration of Biktarvy. If taken with food, Biktarvy can be administered with calcium salts.   Iron Preparations: May decrease the serum concentration of Biktarvy. If taken with food, Biktarvy can be administered with Ferrous sulfate. If taken on an empty stomach, Biktarvy must be taken 2 hours before ferrous sulfate. Avoid other  iron salts.    Contraindications, Warnings, & Precautions     Black Box Warning: Severe acute exacertbations of HBV have been reported in patients coinfected with HIV-1 and HBV fllowing discontinuation of therapy  Coadministration with dofetilide, rifampin is contraindicated  Immune reconstitution syndrome: Patients may develop immune reconstitution syndrome, resulting in the occurrence of an inflammatory response to an indolent or residual opportunistic infection or activation of autoimmune disorders (eg, Graves disease, polymyositis, Guillain-Barr?? syndrome, autoimmune hepatitis)   Lactic acidosis/hepatomegaly  Renal toxicity: patients with preexisting renal impairment and those taking nephrotoxic agents (including NSAIDs) are at increased risk.     Storage, Handling Precautions, & Disposal     Store in the original container at room temperature.   Keep lid tightly closed.   Store in a dry place. Do not store in a bathroom.   Keep all drugs in a safe place. Keep all drugs out of the reach of children and pets.   Throw away unused or expired drugs. Do not flush down a toilet or pour down a drain unless you are told to do so. Check with your pharmacist if you have questions about the best way to throw out drugs. There may be drug take-back programs in your area.      Current Medications (including OTC/herbals), Comorbidities and Allergies     Current Outpatient Medications   Medication Sig Dispense Refill    amlodipine (NORVASC) 2.5 MG tablet Take 1 tablet (2.5 mg total) by mouth daily. 30 tablet 11    bictegrav-emtricit-tenofov ala (BIKTARVY) 50-200-25 mg tablet TAKE 1 TABLET BY MOUTH 1 TIME A DAY. 30 tablet 3    bictegrav-emtricit-tenofov ala (BIKTARVY) 50-200-25 mg tablet Take 1 tablet by mouth daily. 30 tablet 3    losartan-hydroCHLOROthiazide (HYZAAR) 100-25 mg per tablet Take 1 tablet by mouth daily. 90 tablet 1    WEGOVY 0.25 MG/0.5 ML SUBCUTANEOUS PEN INJECTOR Inject 0.25 mg into the skin once a week. Use this dose for 1 month (4 shots) and then increase to next higher dose. 2 mL 0     No current facility-administered medications for this visit.       Not on File    There is no problem list on file for this patient.      Reviewed and up to date in Epic.    HIV ASSOCIATED LABS:     HIV-1 RNA quant-no reflex-bld  Specimen: Blood - Blood  Component  Ref Range & Units 9 mo ago Comments   HIV 1 RNA Quant  Copies/mL Not Detected    HIV-1 RNA Quant, Log  Log cps/mL Not Detected .  Reference Range:                            Not Detected     copies/mL                            Not Detected Log copies/mL  .  Marland Kitchen  The test was performed using Real-Time Polymerase Chain  Reaction.  .  .  Reportable Range: 20 copies/mL to 10,000,000 copies/mL  (1.30 Log copies/mL to 7.00 Log copies/mL).  .   Resulting Agency [name]    Resulting Agency Comment    Performing Organization Information:      Site ID: [id number] (CLIA: [id number])      Name: [name]      Address:  [address] [place], [place] [address]      Director: Benjamine Mola MD  Specimen Collected: 06/20/22 15:39    Performed by: Dyane Dustman Last Resulted: 06/24/22 11:55   Received From: [facility]  Result Received: 04/10/23 15:27       Appropriateness of Therapy     Acute infections noted within Epic:  No active infections  Patient reported infection: None    Is medication and dose appropriate based on diagnosis and infection status? Yes    Prescription has been clinically reviewed: Yes      Baseline Quality of Life Assessment      How many days over the past month did your HIV  keep you from your normal activities? For example, brushing your teeth or getting up in the morning. 0    Financial Information     Medication Assistance provided: Copay Assistance    Anticipated copay of $0.00 reviewed with patient. Verified delivery address.    Delivery Information     Scheduled delivery date: 04/14/23    Expected start date: continuation of current therapy      Medication will be delivered via UPS to the prescription address in [facility].  This shipment will not require a signature.      Explained the services we provide at [facility] and that each month we would call to set up refills.  Stressed importance of returning phone calls so that we could ensure they receive their medications in time each month.  Informed patient that we should be setting up refills 7-10 days prior to when they will run out of medication.  A pharmacist will reach out to perform a clinical assessment periodically.  Informed patient that a welcome packet, containing information about our pharmacy and other support services, a Notice of Privacy Practices, and a drug information handout will be sent.      The patient or caregiver noted above participated in the development of this care plan and knows that they can request review of or adjustments to the care plan at any time.      Patient or caregiver verbalized understanding of the above information as well as how to contact the pharmacy at [phone] option 4 with any questions/concerns.  The pharmacy is open Monday through Friday 8:30am-4:30pm.  A pharmacist is available 24/7 via pager to answer any clinical questions they may have.    Patient Specific Needs     Does the patient have any physical, cognitive, or cultural barriers? No    Does the patient have adequate living arrangements? (i.e. the ability to store and take their medication appropriately) Yes    Did you identify any home environmental safety or security hazards? No    Patient prefers to have medications discussed with  Patient     Is the patient or caregiver able to read and understand education materials at a high school level or above? Yes    Patient's primary language is  English     Is the patient high risk? No    SOCIAL DETERMINANTS OF HEALTH     At the [facility], we have learned that life circumstances - like trouble affording food, housing, utilities, or transportation can affect the health of many of our patients.   That is why we wanted to ask: are you currently experiencing any life circumstances that are negatively impacting your health and/or quality of life? Patient declined to answer    Social Determinants of Health  Financial Resource Strain: Not on file   Internet Connectivity: Not on file   Food Insecurity: Not on file   Tobacco Use: High Risk (03/06/2023)    Received from [facility]    Patient History     Smoking Tobacco Use: Every Day     Smokeless Tobacco Use: Never     Passive Exposure: Not on file   Housing/Utilities: Not on file   Alcohol Use: Not on file   Transportation Needs: Not on file   Substance Use: Not on file   Health Literacy: Not on file   Physical Activity: Not on file   Interpersonal Safety: Not on file   Stress: Not on file   Intimate Partner Violence: Not on file   Depression: Not at risk (02/15/2022)    Received from [facility]    PHQ-2     Patient Health Questionnaire-2 Score: 0   Social Connections: Not on file       Would you be willing to receive help with any of the needs that you have identified today? Not applicable       Roderic Palau, PharmD  [facility] Specialty Pharmacist

## 2023-04-11 MED FILL — BIKTARVY 50 MG-200 MG-25 MG TABLET: ORAL | 30 days supply | Qty: 30 | Fill #0

## 2023-04-11 MED FILL — AMLODIPINE 2.5 MG TABLET: ORAL | 30 days supply | Qty: 30 | Fill #0

## 2023-04-11 MED FILL — LOSARTAN 100 MG-HYDROCHLOROTHIAZIDE 25 MG TABLET: ORAL | 90 days supply | Qty: 90 | Fill #0

## 2023-04-28 ENCOUNTER — Ambulatory Visit: Payer: Commercial Managed Care - PPO | Admitting: Nurse Practitioner

## 2023-04-28 NOTE — Unmapped (Signed)
[facility] Refill Coordination Note    Brandon Solis, DOB: 09-29-1981  Phone: [phone] (home)       All above HIPAA information was verified with patient.         04/28/2023     7:34 AM   Specialty Rx Medication Refill Questionnaire   Which Medications would you like refilled and shipped? Biktarvy   Please list all current allergies: Shellfish   Have you missed any doses in the last 30 days? No   Have you had any changes to your medication(s) since your last refill? No   How many days remaining of each medication do you have at home? Not home, i am not sure   Have you experienced any side effects in the last 30 days? No   Please enter the full address (street address, city, state, zip code) where you would like your medication(s) to be delivered to. [address], [place] [address]   Please specify on which day you would like your medication(s) to arrive. Note: if you need your medication(s) within 3 days, please call the pharmacy to schedule your order at [phone]  05/05/2023   Has your insurance changed since your last refill? No   Would you like a pharmacist to call you to discuss your medication(s)? No   Do you require a signature for your package? (Note: if we are billing Medicare Part B or your order contains a controlled substance, we will require a signature) No         Completed refill call assessment today to schedule patient's medication shipment from the [facility] [phone]).  All relevant notes have been reviewed.       Confirmed patient received a Conservation officer, historic buildings and a Surveyor, mining with first shipment. The patient will receive a drug information handout for each medication shipped and additional FDA Medication Guides as required.         REFERRAL TO PHARMACIST     Referral to the pharmacist: Not needed      [facility]     Shipping address confirmed in Epic.     Delivery Scheduled: Yes, Expected medication delivery date: 05/05/2023. Medication will be delivered via UPS to the prescription address in Epic [facility].    Kerby Less   [facility] Specialty Technician

## 2023-05-02 ENCOUNTER — Other Ambulatory Visit (HOSPITAL_COMMUNITY): Payer: Self-pay

## 2023-05-02 NOTE — Unmapped (Signed)
Brandon Solis 's [place] shipment will be delayed as a result of the medication is too soon to refill until 05/04/2023.     I have reached out to the patient  at [phone] and communicated the delivery change. We will reschedule the medication for the delivery date that the patient agreed upon.  We have confirmed the delivery date as 05/06/2023, via ups.

## 2023-05-05 MED FILL — BIKTARVY 50 MG-200 MG-25 MG TABLET: ORAL | 30 days supply | Qty: 30 | Fill #1

## 2023-05-06 ENCOUNTER — Ambulatory Visit (INDEPENDENT_AMBULATORY_CARE_PROVIDER_SITE_OTHER): Payer: BC Managed Care – PPO | Admitting: Internal Medicine

## 2023-05-06 ENCOUNTER — Encounter (INDEPENDENT_AMBULATORY_CARE_PROVIDER_SITE_OTHER): Payer: Self-pay | Admitting: Internal Medicine

## 2023-05-06 VITALS — BP 132/80 | HR 68 | Temp 98.0°F | Ht 73.0 in | Wt 278.0 lb

## 2023-05-06 DIAGNOSIS — R7303 Prediabetes: Secondary | ICD-10-CM

## 2023-05-06 DIAGNOSIS — E668 Other obesity: Secondary | ICD-10-CM

## 2023-05-06 DIAGNOSIS — R0602 Shortness of breath: Secondary | ICD-10-CM | POA: Diagnosis not present

## 2023-05-06 DIAGNOSIS — F32A Depression, unspecified: Secondary | ICD-10-CM

## 2023-05-06 DIAGNOSIS — I10 Essential (primary) hypertension: Secondary | ICD-10-CM | POA: Diagnosis not present

## 2023-05-06 DIAGNOSIS — E669 Obesity, unspecified: Secondary | ICD-10-CM

## 2023-05-06 DIAGNOSIS — B20 Human immunodeficiency virus [HIV] disease: Secondary | ICD-10-CM

## 2023-05-06 DIAGNOSIS — E7800 Pure hypercholesterolemia, unspecified: Secondary | ICD-10-CM | POA: Diagnosis not present

## 2023-05-06 DIAGNOSIS — Z6836 Body mass index (BMI) 36.0-36.9, adult: Secondary | ICD-10-CM

## 2023-05-06 DIAGNOSIS — R5383 Other fatigue: Secondary | ICD-10-CM | POA: Diagnosis not present

## 2023-05-06 DIAGNOSIS — Z1331 Encounter for screening for depression: Secondary | ICD-10-CM

## 2023-05-06 DIAGNOSIS — E66812 Obesity, class 2: Secondary | ICD-10-CM

## 2023-05-06 NOTE — Assessment & Plan Note (Signed)
Most recent A1c is  Lab Results  Component Value Date   HGBA1C 6.0 (H) 03/06/2023  . Patient informed of disease state and risk of progression. This may contribute to abnormal cravings, fatigue and diabetes complications without having diabetes.   We reviewed treatment options which include losing 7 to 10% of body weight, increasing physical activity to a 150 minutes a week of moderate intensity.He may also be a candidate for pharmacoprophylaxis with metformin or incretin mimetic.

## 2023-05-06 NOTE — Assessment & Plan Note (Signed)
Patient reports having an undetectable viral load and a CD4 count in the stable range.  He is currently on Biktarvy and has been on this regimen before 5 years with good tolerance and adherence.  He was made aware that HIV is a chronic inflammatory state and therefore increases his risk for cardiovascular disease at a young age we therefore need to be aggressive about his cardiovascular risk.  His blood pressures has improved he also has an elevated LDL cholesterol and he has prediabetes all of these conditions increases cardiovascular risk.  He also smokes on the weekends.  He had a coronary artery calcium score that was 0.

## 2023-05-06 NOTE — Assessment & Plan Note (Signed)
LDL is not at goal. Elevated LDL may be secondary to nutrition, genetics and spillover effect from excess adiposity.  Also could be related to antiretroviral therapy.  Recommended LDL goal is <70 to reduce the risk of fatty streaks and the progression to obstructive ASCVD in the future. His 10 year risk is: The 10-year ASCVD risk score (Arnett DK, et al., 2019) is: 10.2%  Lab Results  Component Value Date   CHOL 195 03/06/2023   HDL 42 03/06/2023   LDLCALC 103 (H) 03/06/2023   TRIG 292 (H) 03/06/2023   CHOLHDL 4.6 03/06/2023    Most recent lipid panel was done in a nonfasting state.  We will repeat today to follow-up on hypertriglyceridemia.  Patient may benefit from LDL lowering to less than 70 to reduce cardiovascular risk associated with chronic HIV.  We also discussed harms of smoking and benefits of quitting at his age.

## 2023-05-06 NOTE — Assessment & Plan Note (Signed)
Blood pressure not at goal for age and risk category.  On Hyzaar 100/25 without adverse effects.  Most recent renal parameters reviewed which showed low normal potassium.  We will check magnesium levels today.  Losing 10% of body weight may improve condition.  Continue current regimen monitor for orthostasis while losing weight

## 2023-05-06 NOTE — Progress Notes (Signed)
Chief Complaint:   OBESITY Douglas [name] (MR# [id number]) is a 42 y.o. male who presents for evaluation and treatment of obesity and related comorbidities. Current BMI is Body mass index is 36.68 kg/m. Douglas [name] has been struggling with his weight for many years and has been unsuccessful in either losing weight, maintaining weight loss, or reaching his healthy weight goal.  Douglas [name] is currently in the action stage of change and ready to dedicate time achieving and maintaining a healthier weight. Douglas [name] is interested in becoming our patient and working on intensive lifestyle modifications including (but not limited to) diet and exercise for weight loss.  Douglas [name]'s habits were reviewed today and are as follows: he struggles with family and or coworkers weight loss sabotage, his desired weight loss is 58 lbs, he has been heavy most of his life, he started gaining weight in his late 9's, his heaviest weight ever was 283 pounds, he has significant food cravings issues, he is frequently drinking liquids with calories, he frequently makes poor food choices, he frequently eats larger portions than normal, and he struggles with emotional eating.  Depression Screen Douglas [name] Food and Mood (modified PHQ-9) score was 11.  Subjective:   1. Other fatigue Douglas [name] admits to daytime somnolence and admits to waking up still tired. Patient has a history of symptoms of daytime fatigue and morning fatigue. Douglas [name] generally gets 6 or 8 hours of sleep per night, and states that he has nightime awakenings and generally restful sleep. Snoring is present. Apneic episodes are present. Epworth Sleepiness Score is 4.   2. SOB (shortness of breath) on exertion Douglas [name] notes increasing shortness of breath with exercising and seems to be worsening over time with weight gain. He notes getting out of breath sooner with activity than he used to. This has not gotten worse recently. Douglas [name] denies shortness of breath at  rest or orthopnea.  3. Essential hypertension Blood pressure not at goal for age and risk category.  On Hyzaar 100/25 without adverse effects.  Most recent renal parameters reviewed which showed low normal potassium.  We will check magnesium levels today.  4. Prediabetes Patient informed of disease state and risk of progression. This may contribute to abnormal cravings, fatigue and diabetes complications without having diabetes.   Most recent A1c is  Lab Results  Component Value Date   HGBA1C 6.0 (H) 03/06/2023   5. Pure hypercholesterolemia LDL is not at goal. Elevated LDL may be secondary to nutrition, genetics and spillover effect from excess adiposity.  Also could be related to antiretroviral therapy.  Recommended LDL goal is <70 to reduce the risk of fatty streaks and the progression to obstructive ASCVD in the future. His 10 year risk is: The 10-year ASCVD risk score (Arnett DK, et al., 2019) is: 10.2%  Lab Results  Component Value Date   CHOL 195 03/06/2023   HDL 42 03/06/2023   LDLCALC 103 (H) 03/06/2023   TRIG 292 (H) 03/06/2023   CHOLHDL 4.6 03/06/2023   6. Human immunodeficiency virus (HIV) disease (HCC) Patient reports having an undetectable viral load and a CD4 count in the stable range.  He is currently on Biktarvy and has been on this regimen before 5 years with good tolerance and adherence.  He was made aware that HIV is a chronic inflammatory state and therefore increases his risk for cardiovascular disease at a young age we therefore need to be aggressive about his cardiovascular risk.  His blood pressures has improved he also  has an elevated LDL cholesterol and he has prediabetes, and all of these conditions increases cardiovascular risk.  He also smokes on the weekends.  He had a coronary artery calcium score that was 0.  Assessment/Plan:   1. Other fatigue Douglas [name] does feel that his weight is causing his energy to be lower than it should be. Fatigue may be related  to obesity, depression or many other causes. Labs will be ordered, and in the meanwhile, Douglas [name] will focus on self care including making healthy food choices, increasing physical activity and focusing on stress reduction.  - EKG 12-Lead - Vitamin B12  2. SOB (shortness of breath) on exertion Douglas [name] does feel that he gets out of breath more easily that he used to when he exercises. Douglas [name] shortness of breath appears to be obesity related and exercise induced. He has agreed to work on weight loss and gradually increase exercise to treat his exercise induced shortness of breath. Will continue to monitor closely.  3. Essential hypertension Losing 10% of body weight may improve condition.  Continue current regimen monitor for orthostasis while losing weight.  - Magnesium  4. Prediabetes We reviewed treatment options which include losing 7 to 10% of body weight, increasing physical activity to a 150 minutes a week of moderate intensity.He may also be a candidate for pharmacoprophylaxis with metformin or incretin mimetic.   - Insulin, random - Glucose, fasting  5. Pure hypercholesterolemia Most recent lipid panel was done in a nonfasting state.  We will repeat today to follow-up on hypertriglyceridemia.  Patient may benefit from LDL lowering to less than 70 to reduce cardiovascular risk associated with chronic HIV.  We also discussed harms of smoking and benefits of quitting at his age.  - Lipid Panel With LDL/HDL Ratio  6. Human immunodeficiency virus (HIV) disease (HCC) Patient reports having an undetectable viral load and a CD4 count in the stable range.  He is currently on Biktarvy and has been on this regimen before 5 years with good tolerance and adherence.  He was made aware that HIV is a chronic inflammatory state and therefore increases his risk for cardiovascular disease at a young age we therefore need to be aggressive about his cardiovascular risk.  His blood pressures has improved  he also has an elevated LDL cholesterol and he has prediabetes, and all of these conditions increases cardiovascular risk.  He also smokes on the weekends.  He had a coronary artery calcium score that was 0.  7. Depression screen Douglas [name] had a positive depression screening. Depression is commonly associated with obesity and often results in emotional eating behaviors. We will monitor this closely and work on CBT to help improve the non-hunger eating patterns. Referral to Psychology may be required if no improvement is seen as he continues in our clinic.  8. Obesity, Class II, BMI 35-39.9 - VITAMIN D 25 Hydroxy (Vit-D Deficiency, Fractures)  Douglas [name] is currently in the action stage of change and his goal is to continue with weight loss efforts. I recommend Douglas [name] begin the structured treatment plan as follows:  He has agreed to the Category 4 Plan.  Exercise goals: As is.    Behavioral modification strategies: increasing lean protein intake, decreasing simple carbohydrates, increasing vegetables, increasing water intake, decreasing liquid calories, increasing high fiber foods, no skipping meals, meal planning and cooking strategies, keeping healthy foods in the home, better snacking choices, and planning for success.  He was informed of the importance of frequent follow-up visits to maximize his  success with intensive lifestyle modifications for his multiple health conditions. He was informed we would discuss his lab results at his next visit unless there is a critical issue that needs to be addressed sooner. Douglas [name] agreed to keep his next visit at the agreed upon time to discuss these results.  Objective:   Blood pressure 132/80, pulse 68, temperature 98 F (36.7 C), height 6\' 1"  (1.854 m), weight 278 lb (126.1 kg), SpO2 97 %. Body mass index is 36.68 kg/m.  EKG: Normal sinus rhythm, rate 68 BPM.  Indirect Calorimeter completed today shows a VO2 of 318 and a REE of 2189.  His calculated  basal metabolic rate is 1610 thus his basal metabolic rate is worse than expected.  General: Cooperative, alert, well developed, in no acute distress. HEENT: Conjunctivae and lids unremarkable. Cardiovascular: Regular rhythm.  Lungs: Normal work of breathing. Neurologic: No focal deficits.   Lab Results  Component Value Date   CREATININE 1.26 03/06/2023   BUN 22 03/06/2023   NA 139 03/06/2023   K 3.8 03/06/2023   CL 100 03/06/2023   CO2 21 03/06/2023   Lab Results  Component Value Date   ALT 29 06/20/2022   AST 16 06/20/2022   ALKPHOS 52 09/15/2018   BILITOT 0.6 06/20/2022   Lab Results  Component Value Date   HGBA1C 6.0 (H) 03/06/2023   HGBA1C 6.0 (H) 08/19/2022   HGBA1C 5.7 (H) 04/14/2019   Lab Results  Component Value Date   INSULIN 63.5 (H) 03/06/2023   Lab Results  Component Value Date   TSH 1.230 03/06/2023   Lab Results  Component Value Date   CHOL 195 03/06/2023   HDL 42 03/06/2023   LDLCALC 103 (H) 03/06/2023   TRIG 292 (H) 03/06/2023   CHOLHDL 4.6 03/06/2023   Lab Results  Component Value Date   WBC 6.2 03/06/2023   HGB 15.0 03/06/2023   HCT 44.4 03/06/2023   MCV 84 03/06/2023   PLT 247 03/06/2023   No results found for: "IRON", "TIBC", "FERRITIN"  Attestation Statements:   Reviewed by clinician on day of visit: allergies, medications, problem list, medical history, surgical history, family history, social history, and previous encounter notes.  Time spent on visit including pre-visit chart review and post-visit charting and care was 40 minutes.   Trude Mcburney, am acting as transcriptionist for Worthy Rancher, MD.  I have reviewed the above documentation for accuracy and completeness, and I agree with the above. -Worthy Rancher, MD

## 2023-05-07 LAB — VITAMIN D 25 HYDROXY (VIT D DEFICIENCY, FRACTURES): Vit D, 25-Hydroxy: 14.1 ng/mL — ABNORMAL LOW (ref 30.0–100.0)

## 2023-05-07 LAB — MAGNESIUM: Magnesium: 2.3 mg/dL (ref 1.6–2.3)

## 2023-05-07 LAB — LIPID PANEL WITH LDL/HDL RATIO
Cholesterol, Total: 174 mg/dL (ref 100–199)
HDL: 42 mg/dL (ref >39)
LDL Chol Calc (NIH): 111 mg/dL — ABNORMAL HIGH (ref 0–99)
LDL/HDL Ratio: 2.6 ratio (ref 0.0–3.6)
Triglycerides: 118 mg/dL (ref 0–149)
VLDL Cholesterol Cal: 21 mg/dL (ref 5–40)

## 2023-05-07 LAB — GLUCOSE, FASTING: Glucose, Plasma: 104 mg/dL — ABNORMAL HIGH (ref 70–99)

## 2023-05-07 LAB — INSULIN, RANDOM: INSULIN: 21.5 u[IU]/mL (ref 2.6–24.9)

## 2023-05-07 LAB — VITAMIN B12: Vitamin B-12: 408 pg/mL (ref 232–1245)

## 2023-05-08 NOTE — Progress Notes (Signed)
The 10-year ASCVD risk score (Arnett DK, et al., 2019) is: 9.9%   Values used to calculate the score:     Age: 42 years     Sex: Male     Is Non-Hispanic African American: Yes     Diabetic: No     Tobacco smoker: Yes     Systolic Blood Pressure: 132 mmHg     Is BP treated: Yes     HDL Cholesterol: 42 mg/dL     Total Cholesterol: 174 mg/dL  Sandie Ano, RN

## 2023-05-14 MED FILL — AMLODIPINE 2.5 MG TABLET: ORAL | 30 days supply | Qty: 30 | Fill #1

## 2023-05-20 ENCOUNTER — Ambulatory Visit: Payer: BC Managed Care – PPO | Admitting: Nurse Practitioner

## 2023-05-20 ENCOUNTER — Encounter: Payer: Self-pay | Admitting: Nurse Practitioner

## 2023-05-20 ENCOUNTER — Encounter (INDEPENDENT_AMBULATORY_CARE_PROVIDER_SITE_OTHER): Payer: Self-pay | Admitting: Internal Medicine

## 2023-05-20 ENCOUNTER — Ambulatory Visit (INDEPENDENT_AMBULATORY_CARE_PROVIDER_SITE_OTHER): Payer: BC Managed Care – PPO | Admitting: Internal Medicine

## 2023-05-20 VITALS — BP 131/84 | HR 69 | Temp 98.7°F | Ht 73.0 in | Wt 275.0 lb

## 2023-05-20 VITALS — BP 140/72 | HR 88 | Temp 99.2°F | Ht 73.0 in | Wt 280.2 lb

## 2023-05-20 DIAGNOSIS — E669 Obesity, unspecified: Secondary | ICD-10-CM

## 2023-05-20 DIAGNOSIS — E559 Vitamin D deficiency, unspecified: Secondary | ICD-10-CM | POA: Diagnosis not present

## 2023-05-20 DIAGNOSIS — Z9189 Other specified personal risk factors, not elsewhere classified: Secondary | ICD-10-CM

## 2023-05-20 DIAGNOSIS — Z2821 Immunization not carried out because of patient refusal: Secondary | ICD-10-CM

## 2023-05-20 DIAGNOSIS — B20 Human immunodeficiency virus [HIV] disease: Secondary | ICD-10-CM

## 2023-05-20 DIAGNOSIS — F1010 Alcohol abuse, uncomplicated: Secondary | ICD-10-CM | POA: Diagnosis not present

## 2023-05-20 DIAGNOSIS — E7800 Pure hypercholesterolemia, unspecified: Secondary | ICD-10-CM | POA: Diagnosis not present

## 2023-05-20 DIAGNOSIS — I10 Essential (primary) hypertension: Secondary | ICD-10-CM | POA: Diagnosis not present

## 2023-05-20 DIAGNOSIS — Z6836 Body mass index (BMI) 36.0-36.9, adult: Secondary | ICD-10-CM

## 2023-05-20 DIAGNOSIS — R7303 Prediabetes: Secondary | ICD-10-CM

## 2023-05-20 MED ORDER — WEGOVY 0.25 MG/0.5 ML SUBCUTANEOUS PEN INJECTOR
SUBCUTANEOUS | 0 refills | 0 days
Start: 2023-05-20 — End: ?

## 2023-05-20 MED ORDER — ATORVASTATIN 10 MG TABLET
ORAL_TABLET | Freq: Every day | ORAL | 11 refills | 30 days
Start: 2023-05-20 — End: ?

## 2023-05-20 MED ORDER — ERGOCALCIFEROL (VITAMIN D2) 1,250 MCG (50,000 UNIT) CAPSULE
ORAL_CAPSULE | ORAL | 0 refills | 112 days
Start: 2023-05-20 — End: ?

## 2023-05-20 MED ORDER — AMLODIPINE 5 MG TABLET
ORAL_TABLET | Freq: Every day | ORAL | 1 refills | 90 days
Start: 2023-05-20 — End: ?

## 2023-05-20 MED ORDER — ATORVASTATIN CALCIUM 10 MG PO TABS
10.0000 mg | ORAL_TABLET | Freq: Every day | ORAL | 11 refills | Status: DC
Start: 2023-05-20 — End: 2023-12-04

## 2023-05-20 MED ORDER — AMLODIPINE BESYLATE 5 MG PO TABS: 5.0000 mg | ORAL_TABLET | Freq: Every day | ORAL | 1 refills | Status: DC

## 2023-05-20 MED ORDER — SEMAGLUTIDE-WEIGHT MANAGEMENT 0.25 MG/0.5ML ~~LOC~~ SOAJ
0.2500 mg | SUBCUTANEOUS | 0 refills | Status: DC
Start: 2023-05-20 — End: 2023-06-03

## 2023-05-20 MED ORDER — VITAMIN D (ERGOCALCIFEROL) 1.25 MG (50000 UNIT) PO CAPS
50000.0000 [IU] | ORAL_CAPSULE | ORAL | 0 refills | Status: DC
Start: 2023-05-20 — End: 2024-08-09

## 2023-05-20 NOTE — Assessment & Plan Note (Signed)
Patient reports having an undetectable viral load and a CD4 count in the stable range.  He is currently on Biktarvy and has been on this regimen before 5 years with good tolerance and adherence.  He was made aware that HIV is a chronic inflammatory state and therefore increases his risk for cardiovascular disease at a young age we therefore need to be aggressive about his cardiovascular risk.  His blood pressures has improved he also has an elevated LDL cholesterol and he has prediabetes all of these conditions increases cardiovascular risk.  He also smokes on the weekends.  He had a coronary artery calcium score that was 0.  We discussed about the benefits of smoking cessation.

## 2023-05-20 NOTE — Progress Notes (Signed)
Office: [phone]  /  Fax: [phone]  WEIGHT SUMMARY AND BIOMETRICS  Vitals Temp: 98.7 F (37.1 C) BP: 131/84 Pulse Rate: 69 SpO2: 97 %   Anthropometric Measurements Height: 6\' 1"  (1.854 m) Weight: 275 lb (124.7 kg) BMI (Calculated): 36.29 Weight at Last Visit: 278 lb Weight Lost Since Last Visit: 3 lb Starting Weight: 278 lb Total Weight Loss (lbs): 3 lb (1.361 kg) Peak Weight: 276 lb   Body Composition  Body Fat %: 33.1 % Fat Mass (lbs): 91.2 lbs Muscle Mass (lbs): 175.6 lbs Total Body Water (lbs): 120.4 lbs Visceral Fat Rating : 17    No data recorded Today's Visit #: 2  Starting Date: 05/06/23   HPI  Chief Complaint: OBESITY  Douglas [name] is here to discuss his progress with his obesity treatment plan. He is on the the Category 4 Plan and states he is following his eating plan approximately 80 % of the time. He states he is not exercising.   Interval History:  Since last office visit he has has lost 3 pounds. He reports good adherence to reduced calorie nutritional plan. He has been working on not skipping meals, increasing protein intake at every meal, eating more fruits, eating more vegetables, drinking more water, and working on meal prepping  Orixegenic Control: Reports problems with appetite and hunger signals.  Denies problems with satiety and satiation.  Denies problems with eating patterns and portion control.  Denies abnormal cravings. Denies feeling deprived or restricted.   Barriers identified: strong hunger signals and appetite, medical comorbidities, and presence of obesogenic drugs.   Pharmacotherapy for weight loss: He is currently taking no anti-obesity medication.   The 10-year ASCVD risk score (Arnett DK, et al., 2019) is: 9.8%  ASSESSMENT AND PLAN  TREATMENT PLAN FOR OBESITY:  Recommended Dietary Goals  [name] is currently in the action stage of change. As such, his goal is to continue weight management plan. He has  agreed to: continue current plan  Behavioral Intervention  We discussed the following Behavioral Modification Strategies today: increasing lean protein intake, decreasing simple carbohydrates , increasing vegetables, increasing lower glycemic fruits, increasing fiber rich foods, increasing water intake, continue to practice mindfulness when eating, and planning for success.  Additional resources provided today: None  Recommended Physical Activity Goals  [name] has been advised to work up to 150 minutes of moderate intensity aerobic activity a week and strengthening exercises 2-3 times per week for cardiovascular health, weight loss maintenance and preservation of muscle mass.   He has agreed to :  Think about ways to increase daily physical activity and overcoming barriers to exercise  Pharmacotherapy We discussed various medication options to help Tieler with his weight loss efforts and we both agreed to : start anti-obesity medication. In addition to reduced calorie nutrition plan (RCNP), behavioral strategies and physical activity, Archer would benefit from pharmacotherapy to assist with hunger signals, satiety and cravings. This will reduce obesity-related health risks by inducing weight loss, and help reduce food consumption and adherence to [facility]) . It may also improve QOL by improving self-confidence and reduce the  setbacks associated with metabolic adaptations.  This will also help prevent diabetes he has insulin resistance and prediabetes and has contraindications to metformin as Biktarvy increases the risk of metabolic acidosis.  After discussion of treatment options, mechanisms of action, benefits, side effects, contraindications and shared decision making he is agreeable to starting Wegovy 0.25 mg once a day. Patient also made aware that medication is indicated for long-term  management of obesity and the risk of weight regain following discontinuation of treatment and hence the  importance of adhering to medical weight loss plan.  We demonstrated use of device and patient using teach back method was able to demonstrate proper technique.  ASSOCIATED CONDITIONS ADDRESSED TODAY  Vitamin D deficiency Assessment & Plan: Most recent vitamin D levels  Lab Results  Component Value Date   VD25OH 14.1 (L) 05/06/2023     Deficiency state associated with adiposity and may result in leptin resistance, weight gain and fatigue.  Plan: After discussion of benefits, alternative treatment options and side effects patient will be started on vitamin D2 50,000 units 1 tablet weekly for 3-4 months. for a treatment goal level of 50-60 mg/dl. Check levels at that time for response monitoring.   Orders: -     Vitamin D (Ergocalciferol); Take 1 capsule (50,000 Units total) by mouth every 7 (seven) days.  Dispense: 16 capsule; Refill: 0  Prediabetes Assessment & Plan: Most recent A1c is  Lab Results  Component Value Date   HGBA1C 6.0 (H) 03/06/2023   HGBA1C 5.7 (H) 04/14/2019    Patient aware of disease state and risk of progression. This may contribute to abnormal cravings, fatigue and diabetic complications without having diabetes.   We reviewed treatment options which includes losing 7 to 10% of body weight, increasing physical activity to a goal of 150 minutes a week at moderate intensity.  He has contraindications to metformin because of interaction with Biktarvy.  I therefore recommend starting semaglutide 0.25 mg once a week for pharmacoprophylaxis.   Orders: -     Semaglutide-Weight Management; Inject 0.25 mg into the skin once a week for 28 days.  Dispense: 2 mL; Refill: 0  Human immunodeficiency virus (HIV) disease (HCC) Assessment & Plan: Patient reports having an undetectable viral load and a CD4 count in the stable range.  He is currently on Biktarvy and has been on this regimen before 5 years with good tolerance and adherence.  He was made aware that HIV is a  chronic inflammatory state and therefore increases his risk for cardiovascular disease at a young age we therefore need to be aggressive about his cardiovascular risk.  His blood pressures has improved he also has an elevated LDL cholesterol and he has prediabetes all of these conditions increases cardiovascular risk.  He also smokes on the weekends.  He had a coronary artery calcium score that was 0.  We discussed about the benefits of smoking cessation.   Obesity, Class II, BMI 35-39.9 -     Semaglutide-Weight Management; Inject 0.25 mg into the skin once a week for 28 days.  Dispense: 2 mL; Refill: 0  Pure hypercholesterolemia Assessment & Plan: LDL is not at goal. Elevated LDL may be secondary to nutrition, antiretroviral therapy, genetics and spillover effect from excess adiposity.  Also could be related to antiretroviral therapy.  Recommended LDL goal is <70 to reduce the risk of fatty streaks and the progression to obstructive ASCVD in the future. His 10 year risk is: The 10-year ASCVD risk score (Arnett DK, et al., 2019) is: 9.8%  Lab Results  Component Value Date   CHOL 174 05/06/2023   HDL 42 05/06/2023   LDLCALC 111 (H) 05/06/2023   TRIG 118 05/06/2023   CHOLHDL 4.6 03/06/2023     Patient may benefit from LDL lowering to less than 70 to reduce cardiovascular risk associated with chronic HIV.  We also discussed harms of smoking and benefits of  quitting at his age.  Losing 10% of body weight will improve condition also reducing saturated fats to less than 10% of daily calories.     Orders: -     Semaglutide-Weight Management; Inject 0.25 mg into the skin once a week for 28 days.  Dispense: 2 mL; Refill: 0  Essential hypertension Assessment & Plan: Blood pressure not at goal for age and risk category.  On Hyzaar 100/25 without adverse effects.  Most recent renal parameters reviewed which showed low normal potassium.  We will check magnesium levels today.  Losing 10% of body  weight may improve condition.  Continue current regimen monitor for orthostasis while losing weight.  Work with primary care team to intensify therapy with a goal blood pressure of less than 120/80 for cardiovascular risk reduction.   Orders: -     Semaglutide-Weight Management; Inject 0.25 mg into the skin once a week for 28 days.  Dispense: 2 mL; Refill: 0  At increased risk for cardiovascular disease Assessment & Plan: His calculated 10-year cardiovascular risk is 9.8% he is above the age of 30 and has HIV which is a chronic inflammatory state and increases his risks of premature coronary artery disease although he recently coronary artery calcium score of 0.  He may still benefit from statin therapy.  He will discuss this today with his HIV care provider.     PHYSICAL EXAM:  Blood pressure 131/84, pulse 69, temperature 98.7 F (37.1 C), height 6\' 1"  (1.854 m), weight 275 lb (124.7 kg), SpO2 97 %. Body mass index is 36.28 kg/m.  General: He is overweight, cooperative, alert, well developed, and in no acute distress. PSYCH: Has normal mood, affect and thought process.   HEENT: EOMI, sclerae are anicteric. Lungs: Normal breathing effort, no conversational dyspnea. Extremities: No edema.  Neurologic: No gross sensory or motor deficits. No tremors or fasciculations noted.    DIAGNOSTIC DATA REVIEWED:  BMET    Component Value Date/Time   NA 139 03/06/2023 1604   K 3.8 03/06/2023 1604   CL 100 03/06/2023 1604   CO2 21 03/06/2023 1604   GLUCOSE 92 03/06/2023 1604   GLUCOSE 97 06/20/2022 1539   BUN 22 03/06/2023 1604   CREATININE 1.26 03/06/2023 1604   CREATININE 1.07 06/20/2022 1539   CALCIUM 10.2 03/06/2023 1604   GFRNONAA 86 01/01/2021 1123   GFRAA 100 01/01/2021 1123   Lab Results  Component Value Date   HGBA1C 6.0 (H) 03/06/2023   HGBA1C 5.7 (H) 04/14/2019   Lab Results  Component Value Date   INSULIN 21.5 05/06/2023   INSULIN 63.5 (H) 03/06/2023   Lab Results   Component Value Date   TSH 1.230 03/06/2023   CBC    Component Value Date/Time   WBC 6.2 03/06/2023 1604   WBC 4.5 06/20/2022 1539   RBC 5.26 03/06/2023 1604   RBC 5.10 06/20/2022 1539   HGB 15.0 03/06/2023 1604   HCT 44.4 03/06/2023 1604   PLT 247 03/06/2023 1604   MCV 84 03/06/2023 1604   MCH 28.5 03/06/2023 1604   MCH 28.4 06/20/2022 1539   MCHC 33.8 03/06/2023 1604   MCHC 33.0 06/20/2022 1539   RDW 13.9 03/06/2023 1604   Iron Studies No results found for: "IRON", "TIBC", "FERRITIN", "IRONPCTSAT" Lipid Panel     Component Value Date/Time   CHOL 174 05/06/2023 0919   TRIG 118 05/06/2023 0919   HDL 42 05/06/2023 0919   CHOLHDL 4.6 03/06/2023 1604   CHOLHDL 4.6 12/16/2019 0904  VLDL 14 01/02/2017 1359   LDLCALC 111 (H) 05/06/2023 0919   LDLCALC 135 (H) 12/16/2019 0904   Hepatic Function Panel     Component Value Date/Time   PROT 7.0 06/20/2022 1539   PROT 7.2 09/15/2018 1400   ALBUMIN 4.6 09/15/2018 1400   AST 16 06/20/2022 1539   ALT 29 06/20/2022 1539   ALKPHOS 52 09/15/2018 1400   BILITOT 0.6 06/20/2022 1539   BILITOT 0.4 09/15/2018 1400      Component Value Date/Time   TSH 1.230 03/06/2023 1604   Nutritional Lab Results  Component Value Date   VD25OH 14.1 (L) 05/06/2023     Return in about 2 weeks (around 06/03/2023) for For Weight Mangement with Dr. Rikki Spearing.Marland Kitchen He was informed of the importance of frequent follow up visits to maximize his success with intensive lifestyle modifications for his multiple health conditions.   ATTESTASTION STATEMENTS:  Reviewed by clinician on day of visit: allergies, medications, problem list, medical history, surgical history, family history, social history, and previous encounter notes.     Worthy Rancher, MD

## 2023-05-20 NOTE — Assessment & Plan Note (Signed)
Blood pressure not at goal for age and risk category.  On Hyzaar 100/25 without adverse effects.  Most recent renal parameters reviewed which showed low normal potassium.  We will check magnesium levels today.  Losing 10% of body weight may improve condition.  Continue current regimen monitor for orthostasis while losing weight.  Work with primary care team to intensify therapy with a goal blood pressure of less than 120/80 for cardiovascular risk reduction.

## 2023-05-20 NOTE — Progress Notes (Signed)
Douglas [name], CMA,acting as a Neurosurgeon for Douglas Felts, [name].,have documented all relevant documentation on the behalf of Douglas Felts, [name],as directed by  Douglas Felts, [name] while in the presence of Douglas Felts, [name].  Subjective:  Patient ID: Douglas [name] , male    DOB: [birth year] , 42 y.o.   MRN: [id number]  Chief Complaint  Patient presents with   Hypertension    HPI  Patient presents today for BP, Pre DM, and chol check. Patient reports compliance with medications and has no other concerns today. Patient denies any chest pains, SOB, or headaches. Patient reports no issues with the amlodipine.  BP Readings from Last 3 Encounters: 05/20/23 : (!) 140/72 05/20/23 : 131/84 05/06/23 : 132/80      Past Medical History:  Diagnosis Date   Alcohol abuse    Anxiety    Back pain    Chest pain    Constipation    Family history of heart disease in male family member before age 72    Food allergy    High cholesterol    HIV infection (HCC) 2014   Hypertension 2004   Lactose intolerance    Low HDL (under 40)    Prediabetes    Seasonal allergic rhinitis    weather changes, spring/fall   Sickle cell trait (HCC)    Sleep apnea      Family History  Problem Relation Age of Onset   Diabetes Mother    Hyperlipidemia Mother    Hypertension Mother    Stroke Mother    Kidney disease Mother    Depression Mother    Drug abuse Mother    Obesity Mother    Heart disease Father 7       died of MI   Diabetes Father    Sudden death Father    Alcoholism Father    Obesity Father    Hypertension Sister    Diabetes Sister        type 2   Hypertension Maternal Grandmother    Stroke Maternal Grandmother    Hypertension Maternal Grandfather    Cancer Paternal Grandmother        lung   Heart disease Paternal Uncle        several uncles died in 39s with MI   Hypertension Paternal Uncle    Heart disease Paternal Uncle        MI in 57s     Current Outpatient Medications:     atorvastatin (LIPITOR) 10 MG tablet, Take 1 tablet (10 mg total) by mouth daily., Disp: 30 tablet, Rfl: 11   bictegravir-emtricitabine-tenofovir AF (BIKTARVY) 50-200-25 MG TABS tablet, TAKE 1 TABLET BY MOUTH 1 TIME A DAY., Disp: 30 tablet, Rfl: 3   losartan-hydrochlorothiazide (HYZAAR) 100-25 MG tablet, Take 1 tablet by mouth daily., Disp: 90 tablet, Rfl: 1   Vitamin D, Ergocalciferol, (DRISDOL) 1.25 MG (50000 UNIT) CAPS capsule, Take 1 capsule (50,000 Units total) by mouth every 7 (seven) days., Disp: 16 capsule, Rfl: 0   amLODipine (NORVASC) 5 MG tablet, Take 1 tablet (5 mg total) by mouth daily., Disp: 90 tablet, Rfl: 1   Allergies  Allergen Reactions   Shellfish Allergy Anaphylaxis     Review of Systems  Constitutional: Negative.   HENT: Negative.    Eyes: Negative.   Respiratory: Negative.    Cardiovascular: Negative.   Gastrointestinal: Negative.   Neurological: Negative.   Psychiatric/Behavioral: Negative.    All other systems reviewed and are negative.  Today's Vitals   05/20/23 1441  BP: (!) 140/72  Pulse: 88  Temp: 99.2 F (37.3 C)  TempSrc: Oral  Weight: 280 lb 3.2 oz (127.1 kg)  Height: 6\' 1"  (1.854 m)  PainSc: 0-No pain   Body mass index is 36.97 kg/m.  Wt Readings from Last 3 Encounters:  05/20/23 280 lb 3.2 oz (127.1 kg)  05/20/23 275 lb (124.7 kg)  05/06/23 278 lb (126.1 kg)    The 10-year ASCVD risk score (Arnett DK, et al., 2019) is: 11%   Values used to calculate the score:     Age: 49 years     Sex: Male     Is Non-Hispanic African American: Yes     Diabetic: No     Tobacco smoker: Yes     Systolic Blood Pressure: 140 mmHg     Is BP treated: Yes     HDL Cholesterol: 42 mg/dL     Total Cholesterol: 174 mg/dL  Objective:  Physical Exam Vitals reviewed.  Constitutional:      General: He is not in acute distress.    Appearance: Normal appearance. He is obese. He is not ill-appearing.  Cardiovascular:     Rate and Rhythm: Normal rate and  regular rhythm.     Pulses: Normal pulses.     Heart sounds: Normal heart sounds. No murmur heard. Pulmonary:     Effort: Pulmonary effort is normal. No respiratory distress.     Breath sounds: Normal breath sounds. No wheezing.  Skin:    General: Skin is warm and dry.  Neurological:     General: No focal deficit present.     Mental Status: He is alert and oriented to person, place, and time.     Cranial Nerves: No cranial nerve deficit.     Motor: No weakness.  Psychiatric:        Mood and Affect: Mood normal.        Behavior: Behavior normal.        Thought Content: Thought content normal.        Judgment: Judgment normal.         Assessment And Plan:  1. Essential hypertension Comments: Pressure is slightly elevated even with the repeat.  Will increase dose of amlodipine to 5 mg - amLODipine (NORVASC) 5 MG tablet; Take 1 tablet (5 mg total) by mouth daily.  Dispense: 90 tablet; Refill: 1 - atorvastatin (LIPITOR) 10 MG tablet; Take 1 tablet (10 mg total) by mouth daily.  Dispense: 30 tablet; Refill: 11  2. Pure hypercholesterolemia Comments: Cholesterol levels are stable.  Continue to focus on low-fat diet.  Continue statin, tolerating well. - atorvastatin (LIPITOR) 10 MG tablet; Take 1 tablet (10 mg total) by mouth daily.  Dispense: 30 tablet; Refill: 11  3. Prediabetes Comments: Hemoglobin A1c is stable.  Will check today.  4. Alcohol abuse Comments: Encouraged to continue to cut back.  5. COVID-19 vaccination declined Declines covid 19 vaccine. Discussed risk of covid 57 and if he changes her mind about the vaccine to call the office. Education has been provided regarding the importance of this vaccine but patient still declined. Advised may receive this vaccine at local pharmacy or Health Dept.or vaccine clinic. Aware to provide a copy of the vaccination record if obtained from local pharmacy or Health Dept.  Encouraged to take multivitamin, vitamin d, vitamin c and zinc  to increase immune system. Aware can call office if would like to have vaccine here at office. Verbalized  acceptance and understanding.    Return for uncontrolled bp 3-49months check.  Patient was given opportunity to ask questions. Patient verbalized understanding of the plan and was able to repeat key elements of the plan. All questions were answered to their satisfaction.    Jeanell Sparrow, [name], have reviewed all documentation for this visit. The documentation on 05/20/23 for the exam, diagnosis, procedures, and orders are all accurate and complete.   IF YOU HAVE BEEN REFERRED TO A SPECIALIST, IT MAY TAKE 1-2 WEEKS TO SCHEDULE/PROCESS THE REFERRAL. IF YOU HAVE NOT HEARD FROM US/SPECIALIST IN TWO WEEKS, PLEASE GIVE [place] A CALL AT [phone].

## 2023-05-20 NOTE — Assessment & Plan Note (Signed)
Most recent A1c is  Lab Results  Component Value Date   HGBA1C 6.0 (H) 03/06/2023   HGBA1C 5.7 (H) 04/14/2019    Patient aware of disease state and risk of progression. This may contribute to abnormal cravings, fatigue and diabetic complications without having diabetes.   We reviewed treatment options which includes losing 7 to 10% of body weight, increasing physical activity to a goal of 150 minutes a week at moderate intensity.  He has contraindications to metformin because of interaction with Biktarvy.  I therefore recommend starting semaglutide 0.25 mg once a week for pharmacoprophylaxis.

## 2023-05-20 NOTE — Progress Notes (Deleted)
.smr  Office: [phone]  /  Fax: [phone]  WEIGHT SUMMARY AND BIOMETRICS  Vitals Temp: 98.7 F (37.1 C) BP: 131/84 Pulse Rate: 69 SpO2: 97 %   Anthropometric Measurements Height: 6\' 1"  (1.854 m) Weight: 275 lb (124.7 kg) BMI (Calculated): 36.29 Weight at Last Visit: 278 lb Weight Lost Since Last Visit: 3 lb Starting Weight: 278 lb Total Weight Loss (lbs): 3 lb (1.361 kg) Peak Weight: 276 lb   Body Composition  Body Fat %: 33.1 % Fat Mass (lbs): 91.2 lbs Muscle Mass (lbs): 175.6 lbs Total Body Water (lbs): 120.4 lbs Visceral Fat Rating : 17   Other Clinical Data Fasting: No Labs: No Today's Visit #: 2 Starting Date: 05/06/23     HPI  Chief Complaint: OBESITY  Douglas [name] is here to discuss his progress with his obesity treatment plan. He is on the the Category 4 Plan and states he is following his eating plan approximately 80 % of the time. He states he is not exercising.   Interval History:  Since last office visit he *** Hunger/appetite-*** Cravings- *** Stress- *** Sleep- *** Exercise-*** Hydration-***   Pharmacotherapy: ***  TREATMENT PLAN FOR OBESITY:  Recommended Dietary Goals  Douglas [name] is currently in the action stage of change. As such, his goal is to continue weight management plan. He has agreed to {MWMwtlossportion/plan2:23431}.  Behavioral Intervention  We discussed the following Behavioral Modification Strategies today: {EMWMwtlossstrategies:28914::"increasing lean protein intake","decreasing simple carbohydrates ","increasing vegetables","increasing lower glycemic fruits","increasing water intake","continue to practice mindfulness when eating","planning for success"}.  Additional resources provided today: NA  Recommended Physical Activity Goals  Douglas [name] has been advised to work up to 150 minutes of moderate intensity aerobic activity a week and strengthening exercises 2-3 times per week for cardiovascular health, weight loss  maintenance and preservation of muscle mass.   He has agreed to {EMEXERCISE:28847::"Think about ways to increase daily physical activity and overcoming barriers to exercise"}   Pharmacotherapy We discussed various medication options to help Douglas [name] with his weight loss efforts and we both agreed to ***.    No follow-ups on file.Marland Kitchen He was informed of the importance of frequent follow up visits to maximize his success with intensive lifestyle modifications for his multiple health conditions.  PHYSICAL EXAM:  Blood pressure 131/84, pulse 69, temperature 98.7 F (37.1 C), height 6\' 1"  (1.854 m), weight 275 lb (124.7 kg), SpO2 97 %. Body mass index is 36.28 kg/m.  General: He is overweight, cooperative, alert, well developed, and in no acute distress. PSYCH: Has normal mood, affect and thought process.   Lungs: Normal breathing effort, no conversational dyspnea.  DIAGNOSTIC DATA REVIEWED:  BMET    Component Value Date/Time   NA 139 03/06/2023 1604   K 3.8 03/06/2023 1604   CL 100 03/06/2023 1604   CO2 21 03/06/2023 1604   GLUCOSE 92 03/06/2023 1604   GLUCOSE 97 06/20/2022 1539   BUN 22 03/06/2023 1604   CREATININE 1.26 03/06/2023 1604   CREATININE 1.07 06/20/2022 1539   CALCIUM 10.2 03/06/2023 1604   GFRNONAA 86 01/01/2021 1123   GFRAA 100 01/01/2021 1123   Lab Results  Component Value Date   HGBA1C 6.0 (H) 03/06/2023   HGBA1C 5.7 (H) 04/14/2019   Lab Results  Component Value Date   INSULIN 21.5 05/06/2023   INSULIN 63.5 (H) 03/06/2023   Lab Results  Component Value Date   TSH 1.230 03/06/2023   CBC    Component Value Date/Time   WBC 6.2 03/06/2023 1604   WBC  4.5 06/20/2022 1539   RBC 5.26 03/06/2023 1604   RBC 5.10 06/20/2022 1539   HGB 15.0 03/06/2023 1604   HCT 44.4 03/06/2023 1604   PLT 247 03/06/2023 1604   MCV 84 03/06/2023 1604   MCH 28.5 03/06/2023 1604   MCH 28.4 06/20/2022 1539   MCHC 33.8 03/06/2023 1604   MCHC 33.0 06/20/2022 1539   RDW 13.9  03/06/2023 1604   Iron Studies No results found for: "IRON", "TIBC", "FERRITIN", "IRONPCTSAT" Lipid Panel     Component Value Date/Time   CHOL 174 05/06/2023 0919   TRIG 118 05/06/2023 0919   HDL 42 05/06/2023 0919   CHOLHDL 4.6 03/06/2023 1604   CHOLHDL 4.6 12/16/2019 0904   VLDL 14 01/02/2017 1359   LDLCALC 111 (H) 05/06/2023 0919   LDLCALC 135 (H) 12/16/2019 0904   Hepatic Function Panel     Component Value Date/Time   PROT 7.0 06/20/2022 1539   PROT 7.2 09/15/2018 1400   ALBUMIN 4.6 09/15/2018 1400   AST 16 06/20/2022 1539   ALT 29 06/20/2022 1539   ALKPHOS 52 09/15/2018 1400   BILITOT 0.6 06/20/2022 1539   BILITOT 0.4 09/15/2018 1400      Component Value Date/Time   TSH 1.230 03/06/2023 1604   Nutritional Lab Results  Component Value Date   VD25OH 14.1 (L) 05/06/2023    ASSOCIATED CONDITIONS ADDRESSED TODAY  ASSESSMENT AND PLAN  Problem List Items Addressed This Visit   None   ATTESTASTION STATEMENTS:  Reviewed by clinician on day of visit: allergies, medications, problem list, medical history, surgical history, family history, social history, and previous encounter notes.   I have personally spent 30 minutes total time today in preparation, patient care, nutritional counseling and documentation for this visit, including the following: review of clinical lab tests; review of medical tests/procedures/services.      Worthy Rancher, MD

## 2023-05-20 NOTE — Assessment & Plan Note (Signed)
His calculated 10-year cardiovascular risk is 9.8% he is above the age of 17 and has HIV which is a chronic inflammatory state and increases his risks of premature coronary artery disease although he recently coronary artery calcium score of 0.  He may still benefit from statin therapy.  He will discuss this today with his HIV care provider.

## 2023-05-20 NOTE — Assessment & Plan Note (Signed)
LDL is not at goal. Elevated LDL may be secondary to nutrition, antiretroviral therapy, genetics and spillover effect from excess adiposity.  Also could be related to antiretroviral therapy.  Recommended LDL goal is <70 to reduce the risk of fatty streaks and the progression to obstructive ASCVD in the future. His 10 year risk is: The 10-year ASCVD risk score (Arnett DK, et al., 2019) is: 9.8%  Lab Results  Component Value Date   CHOL 174 05/06/2023   HDL 42 05/06/2023   LDLCALC 111 (H) 05/06/2023   TRIG 118 05/06/2023   CHOLHDL 4.6 03/06/2023     Patient may benefit from LDL lowering to less than 70 to reduce cardiovascular risk associated with chronic HIV.  We also discussed harms of smoking and benefits of quitting at his age.  Losing 10% of body weight will improve condition also reducing saturated fats to less than 10% of daily calories.

## 2023-05-20 NOTE — Assessment & Plan Note (Signed)
Most recent vitamin D levels  Lab Results  Component Value Date   VD25OH 14.1 (L) 05/06/2023     Deficiency state associated with adiposity and may result in leptin resistance, weight gain and fatigue.  Plan: After discussion of benefits, alternative treatment options and side effects patient will be started on vitamin D2 50,000 units 1 tablet weekly for 3-4 months. for a treatment goal level of 50-60 mg/dl. Check levels at that time for response monitoring.

## 2023-05-22 MED FILL — ATORVASTATIN 10 MG TABLET: ORAL | 90 days supply | Qty: 90 | Fill #0

## 2023-05-22 MED FILL — AMLODIPINE 5 MG TABLET: ORAL | 90 days supply | Qty: 90 | Fill #0

## 2023-05-22 MED FILL — ERGOCALCIFEROL (VITAMIN D2) 1,250 MCG (50,000 UNIT) CAPSULE: ORAL | 84 days supply | Qty: 12 | Fill #0

## 2023-05-28 NOTE — Unmapped (Signed)
[facility] Refill Coordination Note    Brandon Solis, DOB: Feb 25, 1981  Phone: [phone] (home)       All above HIPAA information was verified with patient.         05/28/2023     1:49 PM   Specialty Rx Medication Refill Questionnaire   Which Medications would you like refilled and shipped? Biktarvy   Please list all current allergies: Shell fish   Have you missed any doses in the last 30 days? No   Have you had any changes to your medication(s) since your last refill? No   How many days remaining of each medication do you have at home? 10   Have you experienced any side effects in the last 30 days? No   Please enter the full address (street address, city, state, zip code) where you would like your medication(s) to be delivered to. [address], [place] [address]   Please specify on which day you would like your medication(s) to arrive. Note: if you need your medication(s) within 3 days, please call the pharmacy to schedule your order at [phone]  05/30/2023   Has your insurance changed since your last refill? No   Would you like a pharmacist to call you to discuss your medication(s)? No   Do you require a signature for your package? (Note: if we are billing Medicare Part B or your order contains a controlled substance, we will require a signature) No         Completed refill call assessment today to schedule patient's medication shipment from the [facility] [phone]).  All relevant notes have been reviewed.       Confirmed patient received a Conservation officer, historic buildings and a Surveyor, mining with first shipment. The patient will receive a drug information handout for each medication shipped and additional FDA Medication Guides as required.         REFERRAL TO PHARMACIST     Referral to the pharmacist: Not needed      [facility]     Shipping address confirmed in Epic.     Delivery Scheduled: Yes, Expected medication delivery date: 06/02/2023. Confirmed delivery date with pt.    Medication will be delivered via UPS to the prescription address in Epic WAM.    Kerby Less   [facility] Specialty Technician

## 2023-05-30 MED FILL — BIKTARVY 50 MG-200 MG-25 MG TABLET: ORAL | 30 days supply | Qty: 30 | Fill #2

## 2023-06-02 NOTE — Progress Notes (Signed)
TeleHealth Visit:  This visit was completed with telemedicine (audio/video) technology. Douglas [name] Douglas [name] [name] has verbally consented to this TeleHealth visit. The patient is located at home, the provider is located at home. The participants in this visit include the listed provider and patient. The visit was conducted today via MyChart video.  Douglas [name] Douglas [name] [name] is here to discuss his progress with his Douglas [name] treatment plan along with follow-up of his Douglas [name] related diagnoses.   Today's visit was # 3 Douglas [name] Douglas [name] [name]: 278 lbs Douglas [name] date: 05/06/23 Douglas [name] [name] at last in office visit: 275 lbs on 05/20/23 Total Douglas [name] [name] loss: 3 lbs at last in office visit on 05/20/23. Today's reported Douglas [name] [name] (06/03/23): none reported  Nutrition Plan: the Category 4 plan   Current exercise:  walks for 60 minutes 5-6 days week., strength training- sporadic  Interim History:  He reports he consistently gets in adequate protein. However, often skips breakfast and lunch. He has been challenged with social situations in which he wanted to eat to blend in an has not followed the plan in the past few weeks..  Drinks water or zero calories beverages.  Drinks alcohol socially.  He lives at home and cooks for himself.  He denies polyphagia and feels he eats more as response to "food noise".  Has been on Wegovy in the past which worked very well for him.  Wegovy prescribed last office visit but denied by insurance.  He has to work on lifestyle changes for 6 months before he can get coverage.  Assessment/Plan:  1. Hypertension Hypertension poorly controlled.  Medication(s): Amlodipine 5 mg daily, losartan-HCTZ 100-25 mg daily.  Good compliance.  BP Readings from Last 3 Encounters:  05/20/23 (!) 140/72  05/20/23 131/84  05/06/23 132/80   Lab Results  Component Value Date   CREATININE 1.26 03/06/2023   CREATININE 1.07 06/20/2022   CREATININE 1.08 01/01/2021   No results found for: "GFR"  Plan: Continue all  antihypertensives at current dosages. Continue to monitor.   2. Other depression/emotional eating Douglas [name] has had issues with stress/emotional eating. Currently this is poorly controlled. Overall mood is stable. Medication(s): none  Plan: Blood pressure poorly controlled so bupropion not a good choice. Discussed and he defers Douglas [name] medication at this time. Consider topiramate at future visit.   3. Generalized Douglas [name]: Current BMI 71  Douglas [name] is currently in the action stage of change. As such, his goal is to continue with Douglas [name] [name] loss efforts.  He has agreed to the Category 4 plan.  1.  He would like to work on being consistent with eating breakfast every day. 2.  Agrees to have protein shake if he skips lunch.  Exercise goals:  as is  Behavioral modification strategies: no meal skipping, meal planning , and planning for success.  Douglas [name] has agreed to follow-up with our clinic in 3 weeks.  No orders of the defined types were placed in this encounter.   There are no discontinued medications.   No orders of the defined types were placed in this encounter.     Objective:   VITALS: Per patient if applicable, see vitals. GENERAL: Alert and in no acute distress. CARDIOPULMONARY: No increased WOB. Speaking in clear sentences.  PSYCH: Pleasant and cooperative. Speech normal rate and rhythm. Affect is appropriate. Insight and judgement are appropriate. Attention is focused, linear, and appropriate.  NEURO: Oriented as arrived to appointment on time with no prompting.   Attestation Statements:   Reviewed by clinician on day of visit: allergies, medications, problem list, medical history, surgical history,  family history, social history, and previous encounter notes.  Time spent on visit including the items listed below was 35 minutes.  -preparing to see the patient (e.g., review of tests, history, previous notes) -obtaining and/or reviewing separately obtained  history -counseling and educating the patient/family/caregiver -documenting clinical information in the electronic or other health record -ordering medications, tests, or procedures -independently interpreting results and communicating results to the patient/ family/caregiver -referring and communicating with other health care professionals  -care coordination   This was prepared with the assistance of Engineer, civil (consulting).  Occasional wrong-word or sound-a-like substitutions may have occurred due to the inherent limitations of voice recognition software.

## 2023-06-03 ENCOUNTER — Encounter (INDEPENDENT_AMBULATORY_CARE_PROVIDER_SITE_OTHER): Payer: Self-pay | Admitting: Family Medicine

## 2023-06-03 ENCOUNTER — Telehealth (INDEPENDENT_AMBULATORY_CARE_PROVIDER_SITE_OTHER): Payer: BC Managed Care – PPO | Admitting: Family Medicine

## 2023-06-03 DIAGNOSIS — F509 Eating disorder, unspecified: Secondary | ICD-10-CM | POA: Insufficient documentation

## 2023-06-03 DIAGNOSIS — F5089 Other specified eating disorder: Secondary | ICD-10-CM | POA: Diagnosis not present

## 2023-06-03 DIAGNOSIS — I10 Essential (primary) hypertension: Secondary | ICD-10-CM

## 2023-06-03 DIAGNOSIS — E669 Obesity, unspecified: Secondary | ICD-10-CM | POA: Diagnosis not present

## 2023-06-03 DIAGNOSIS — Z6836 Body mass index (BMI) 36.0-36.9, adult: Secondary | ICD-10-CM

## 2023-06-04 ENCOUNTER — Other Ambulatory Visit: Payer: Self-pay | Admitting: Oncology

## 2023-06-04 DIAGNOSIS — Z006 Encounter for examination for normal comparison and control in clinical research program: Secondary | ICD-10-CM

## 2023-06-08 ENCOUNTER — Encounter: Payer: Self-pay | Admitting: Nurse Practitioner

## 2023-06-23 ENCOUNTER — Encounter (INDEPENDENT_AMBULATORY_CARE_PROVIDER_SITE_OTHER): Payer: Self-pay | Admitting: Internal Medicine

## 2023-06-23 ENCOUNTER — Ambulatory Visit (INDEPENDENT_AMBULATORY_CARE_PROVIDER_SITE_OTHER): Payer: BC Managed Care – PPO | Admitting: Internal Medicine

## 2023-06-23 VITALS — BP 134/86 | HR 77 | Temp 98.2°F | Ht 73.0 in | Wt 277.0 lb

## 2023-06-23 DIAGNOSIS — E559 Vitamin D deficiency, unspecified: Secondary | ICD-10-CM | POA: Diagnosis not present

## 2023-06-23 DIAGNOSIS — R7303 Prediabetes: Secondary | ICD-10-CM | POA: Diagnosis not present

## 2023-06-23 DIAGNOSIS — Z6836 Body mass index (BMI) 36.0-36.9, adult: Secondary | ICD-10-CM

## 2023-06-23 DIAGNOSIS — I10 Essential (primary) hypertension: Secondary | ICD-10-CM

## 2023-06-23 DIAGNOSIS — F5089 Other specified eating disorder: Secondary | ICD-10-CM | POA: Diagnosis not present

## 2023-06-23 NOTE — Assessment & Plan Note (Signed)
Most recent vitamin D levels  Lab Results  Component Value Date   VD25OH 14.1 (L) 05/06/2023     Deficiency state associated with adiposity and may result in leptin resistance, weight gain and fatigue. Currently on vitamin D supplementation without any adverse effects.  Plan: Continue high-dose vitamin D supplementation for total 4 months.  Recheck levels in October.

## 2023-06-23 NOTE — Progress Notes (Signed)
Office: [phone]  /  Fax: [phone]  WEIGHT SUMMARY AND BIOMETRICS  Vitals Temp: 98.2 F (36.8 C) BP: 134/86 Pulse Rate: 77 SpO2: 100 %   Anthropometric Measurements Height: 6\' 1"  (1.854 m) Weight: 277 lb (125.6 kg) BMI (Calculated): 36.55 Weight at Last Visit: 275 lb Weight Lost Since Last Visit: 0 lb Weight Gained Since Last Visit: 2 lb Starting Weight: 278 lb Total Weight Loss (lbs): 2 lb (0.907 kg) Peak Weight: 276 lb   Body Composition  Body Fat %: 34.7 % Fat Mass (lbs): 96.2 lbs Muscle Mass (lbs): 172 lbs Total Body Water (lbs): 123 lbs Visceral Fat Rating : 17    RMR: 2189  Today's Visit #: 3  Starting Date: 05/06/23   HPI  Chief Complaint: OBESITY  Douglas [name] is here to discuss his progress with his obesity treatment plan. He is on the the Category 4 Plan and states he is following his eating plan approximately 10 % of the time. He states he is exercising 20 minutes 2 times per week.  Interval History:  Since last office visit he has gained 2 pounds.  He got tired of tracking and measuring portions Was eating until satisfied and has been reducing simple carbs.  He likes meats and vegetables for the most part. He reports variable adherence to reduced calorie nutritional plan He has been working on not skipping meals, increasing protein intake at every meal, eating more fruits, eating more vegetables, drinking more water, avoiding and / or reducing liquid calories, and avoiding or reducing simple and processed carbohydrates  Orixegenic Control: Reports problems with appetite and hunger signals.  Denies problems with satiety and satiation.  Denies problems with eating patterns and portion control.  Reports abnormal cravings. Denies feeling deprived or restricted.   Barriers identified: strong hunger signals and appetite, having difficulty with meal prep and planning, and presence of obesogenic drugs.   Pharmacotherapy for weight loss: He is  currently taking no anti-obesity medication.    ASSESSMENT AND PLAN  TREATMENT PLAN FOR OBESITY:  Recommended Dietary Goals  Douglas [name] is currently in the action stage of change. As such, his goal is to continue weight management plan. He has agreed to: portion control, balanced plate and making smarter food choices, such as increasing vegetables, protein intake and reducing simple carbohydrates and processed foods  and continue current plan  Behavioral Intervention  We discussed the following Behavioral Modification Strategies today: increasing lean protein intake, decreasing simple carbohydrates , increasing vegetables, increasing lower glycemic fruits, increasing fiber rich foods, avoiding skipping meals, increasing water intake, continue to practice mindfulness when eating, and planning for success.  Additional resources provided today: Handout on how to make protein smoothies  Recommended Physical Activity Goals  Douglas [name] has been advised to work up to 150 minutes of moderate intensity aerobic activity a week and strengthening exercises 2-3 times per week for cardiovascular health, weight loss maintenance and preservation of muscle mass.   He has agreed to :  Think about ways to increase daily physical activity and overcoming barriers to exercise  Pharmacotherapy We discussed various medication options to help Douglas [name] with his weight loss efforts and we both agreed to :  His insurance denied Wegovy at the present time.  We briefly reviewed using Qsymia as I feel he does benefit from appetite suppressant.  He will discuss this with his HIV care provider as topiramate may affect the levels of Biktarvy but this is at higher dosages.  ASSOCIATED CONDITIONS ADDRESSED TODAY  Essential  hypertension  Other disorder of eating/emotional eating  Class 2 severe obesity with serious comorbidity and body mass index (BMI) of 35.0 to 35.9 in adult, unspecified obesity type [facility]) Assessment &  Plan: See treatment plan for obesity   Prediabetes Assessment & Plan: Most recent A1c is  Lab Results  Component Value Date   HGBA1C 6.0 (H) 03/06/2023   HGBA1C 5.7 (H) 04/14/2019    Patient aware of disease state and risk of progression. This may contribute to abnormal cravings, fatigue and diabetic complications without having diabetes.   We reviewed treatment options which includes losing 7 to 10% of body weight, increasing physical activity to a goal of 150 minutes a week at moderate intensity.  He has contraindications to metformin because of interaction with Biktarvy.  I therefore recommended starting semaglutide 0.25 mg once a week for pharmacoprophylaxis with medication was denied by his insurance.    Vitamin D deficiency Assessment & Plan: Most recent vitamin D levels  Lab Results  Component Value Date   VD25OH 14.1 (L) 05/06/2023     Deficiency state associated with adiposity and may result in leptin resistance, weight gain and fatigue. Currently on vitamin D supplementation without any adverse effects.  Plan: Continue high-dose vitamin D supplementation for total 4 months.  Recheck levels in October.      PHYSICAL EXAM:  Blood pressure 134/86, pulse 77, temperature 98.2 F (36.8 C), height 6\' 1"  (1.854 m), weight 277 lb (125.6 kg), SpO2 100%. Body mass index is 36.55 kg/m.  General: He is overweight, cooperative, alert, well developed, and in no acute distress. PSYCH: Has normal mood, affect and thought process.   HEENT: EOMI, sclerae are anicteric. Lungs: Normal breathing effort, no conversational dyspnea. Extremities: No edema.  Neurologic: No gross sensory or motor deficits. No tremors or fasciculations noted.    DIAGNOSTIC DATA REVIEWED:  BMET    Component Value Date/Time   NA 139 03/06/2023 1604   K 3.8 03/06/2023 1604   CL 100 03/06/2023 1604   CO2 21 03/06/2023 1604   GLUCOSE 92 03/06/2023 1604   GLUCOSE 97 06/20/2022 1539   BUN 22  03/06/2023 1604   CREATININE 1.26 03/06/2023 1604   CREATININE 1.07 06/20/2022 1539   CALCIUM 10.2 03/06/2023 1604   GFRNONAA 86 01/01/2021 1123   GFRAA 100 01/01/2021 1123   Lab Results  Component Value Date   HGBA1C 6.0 (H) 03/06/2023   HGBA1C 5.7 (H) 04/14/2019   Lab Results  Component Value Date   INSULIN 21.5 05/06/2023   INSULIN 63.5 (H) 03/06/2023   Lab Results  Component Value Date   TSH 1.230 03/06/2023   CBC    Component Value Date/Time   WBC 6.2 03/06/2023 1604   WBC 4.5 06/20/2022 1539   RBC 5.26 03/06/2023 1604   RBC 5.10 06/20/2022 1539   HGB 15.0 03/06/2023 1604   HCT 44.4 03/06/2023 1604   PLT 247 03/06/2023 1604   MCV 84 03/06/2023 1604   MCH 28.5 03/06/2023 1604   MCH 28.4 06/20/2022 1539   MCHC 33.8 03/06/2023 1604   MCHC 33.0 06/20/2022 1539   RDW 13.9 03/06/2023 1604   Iron Studies No results found for: "IRON", "TIBC", "FERRITIN", "IRONPCTSAT" Lipid Panel     Component Value Date/Time   CHOL 174 05/06/2023 0919   TRIG 118 05/06/2023 0919   HDL 42 05/06/2023 0919   CHOLHDL 4.6 03/06/2023 1604   CHOLHDL 4.6 12/16/2019 0904   VLDL 14 01/02/2017 1359   LDLCALC 111 (H)  05/06/2023 0919   LDLCALC 135 (H) 12/16/2019 0904   Hepatic Function Panel     Component Value Date/Time   PROT 7.0 06/20/2022 1539   PROT 7.2 09/15/2018 1400   ALBUMIN 4.6 09/15/2018 1400   AST 16 06/20/2022 1539   ALT 29 06/20/2022 1539   ALKPHOS 52 09/15/2018 1400   BILITOT 0.6 06/20/2022 1539   BILITOT 0.4 09/15/2018 1400      Component Value Date/Time   TSH 1.230 03/06/2023 1604   Nutritional Lab Results  Component Value Date   VD25OH 14.1 (L) 05/06/2023     Return in about 4 weeks (around 07/21/2023) for For Weight Mangement with Dr. Rikki Spearing.Marland Kitchen He was informed of the importance of frequent follow up visits to maximize his success with intensive lifestyle modifications for his multiple health conditions.   ATTESTASTION STATEMENTS:  Reviewed by  clinician on day of visit: allergies, medications, problem list, medical history, surgical history, family history, social history, and previous encounter notes.   I have spent 30 minutes in the care of the patient today including: preparing to see patient (e.g. review and interpretation of tests, old notes ), obtaining and/or reviewing separately obtained history, counseling and educating the patient, documenting clinical information in the electronic or other health care record, and independently interpreting results and communicating results to the patient,family, or caregiver   Worthy Rancher, MD

## 2023-06-23 NOTE — Assessment & Plan Note (Signed)
Most recent A1c is  Lab Results  Component Value Date   HGBA1C 6.0 (H) 03/06/2023   HGBA1C 5.7 (H) 04/14/2019    Patient aware of disease state and risk of progression. This may contribute to abnormal cravings, fatigue and diabetic complications without having diabetes.   We reviewed treatment options which includes losing 7 to 10% of body weight, increasing physical activity to a goal of 150 minutes a week at moderate intensity.  He has contraindications to metformin because of interaction with Biktarvy.  I therefore recommended starting semaglutide 0.25 mg once a week for pharmacoprophylaxis with medication was denied by his insurance.

## 2023-06-23 NOTE — Assessment & Plan Note (Signed)
See treatment plan for obesity

## 2023-06-24 ENCOUNTER — Encounter: Payer: Self-pay | Admitting: Internal Medicine

## 2023-06-24 NOTE — Telephone Encounter (Signed)
No interactions

## 2023-06-30 NOTE — Unmapped (Signed)
[facility] Specialty Pharmacy Refill Coordination Note    Brandon Solis, DOB: 06-Mar-1981  Phone: [phone] (home)       All above HIPAA information was verified with patient.         06/30/2023     9:02 AM   Specialty Rx Medication Refill Questionnaire   Which Medications would you like refilled and shipped? Biktarvy 10 days left, Losartan HCTZ 100-25 MG 15 DAYS LEFT   Please list all current allergies: Shellfish   Have you missed any doses in the last 30 days? Yes   If Yes, how many doses have you missed ? 0-2   Have you had any changes to your medication(s) since your last refill? No   How many days remaining of each medication do you have at home? 10-15   Have you experienced any side effects in the last 30 days? No   Please enter the full address (street address, city, state, zip code) where you would like your medication(s) to be delivered to. [address] [place] [address]   Please specify on which day you would like your medication(s) to arrive. Note: if you need your medication(s) within 3 days, please call the pharmacy to schedule your order at [phone]  07/11/2023   Has your insurance changed since your last refill? No   Would you like a pharmacist to call you to discuss your medication(s)? No   Do you require a signature for your package? (Note: if we are billing Medicare Part B or your order contains a controlled substance, we will require a signature) No         Completed refill call assessment today to schedule patient's medication shipment from the [facility] [phone]).  All relevant notes have been reviewed.       Confirmed patient received a Conservation officer, historic buildings and a Surveyor, mining with first shipment. The patient will receive a drug information handout for each medication shipped and additional FDA Medication Guides as required.         REFERRAL TO PHARMACIST     Referral to the pharmacist: Not needed      [facility]     Shipping address confirmed in Epic. Delivery Scheduled: Yes, Expected medication delivery date: 07/11/23 .     Medication will be delivered via UPS to the prescription address in Epic WAM.    Ricci Barker   [facility] Specialty Technician

## 2023-07-07 ENCOUNTER — Ambulatory Visit (INDEPENDENT_AMBULATORY_CARE_PROVIDER_SITE_OTHER): Payer: BC Managed Care – PPO | Admitting: Internal Medicine

## 2023-07-08 ENCOUNTER — Ambulatory Visit: Payer: 59 | Admitting: Internal Medicine

## 2023-07-10 MED FILL — BIKTARVY 50 MG-200 MG-25 MG TABLET: ORAL | 30 days supply | Qty: 30 | Fill #3

## 2023-07-11 MED FILL — LOSARTAN 100 MG-HYDROCHLOROTHIAZIDE 25 MG TABLET: ORAL | 90 days supply | Qty: 90 | Fill #1

## 2023-07-21 ENCOUNTER — Encounter: Payer: Self-pay | Admitting: Internal Medicine

## 2023-07-21 ENCOUNTER — Other Ambulatory Visit: Payer: Self-pay

## 2023-07-21 ENCOUNTER — Encounter (INDEPENDENT_AMBULATORY_CARE_PROVIDER_SITE_OTHER): Payer: Self-pay

## 2023-07-21 ENCOUNTER — Other Ambulatory Visit (HOSPITAL_COMMUNITY)
Admission: RE | Admit: 2023-07-21 | Discharge: 2023-07-21 | Disposition: A | Discharge status: Home / Self Care | Payer: BC Managed Care – PPO | Source: Ambulatory Visit | Attending: Internal Medicine | Admitting: Internal Medicine

## 2023-07-21 ENCOUNTER — Ambulatory Visit (INDEPENDENT_AMBULATORY_CARE_PROVIDER_SITE_OTHER): Payer: BC Managed Care – PPO | Admitting: Internal Medicine

## 2023-07-21 ENCOUNTER — Ambulatory Visit: Payer: BC Managed Care – PPO | Admitting: Internal Medicine

## 2023-07-21 VITALS — BP 135/84 | HR 80 | Temp 97.7°F | Ht 72.0 in | Wt 277.0 lb

## 2023-07-21 DIAGNOSIS — B20 Human immunodeficiency virus [HIV] disease: Secondary | ICD-10-CM

## 2023-07-21 NOTE — Progress Notes (Signed)
   HPI: Douglas [name] is a 42 y.o. male who presents to the [facility] pharmacy clinic for Douglas [name] counseling.  Current HIV Regimen: Biktarvy  Assessment: Douglas [name] presents today for initial counseling for [name]. Counseled that [name] is two separate intramuscular injections in the gluteal muscle on each side for each visit. Explained that the second injection is 30 days after the initial injection then every 2 months thereafter. Discussed the rare but significant chance of developing resistance despite compliance. Explained that showing up to injection appointments is very important and warned that if 2 appointments are missed, it will be reassessed by their provider whether they are a good candidate for injection therapy. He does have reliable transportation and the ability to make time for appointments. Counseled on possible side effects associated with the injections such as injection site pain, which is usually mild to moderate in nature, injection site nodules, and injection site reactions. Asked to call the clinic or send me a mychart message if they experience any issues, such as fatigue, nausea, headache, rash, or dizziness. Advised that they can take ibuprofen or tylenol for injection site pain if needed.   He is a little unsure about wanting to initiate Cabenuva due to his work schedule. I advised him that we would continue his Biktarvy for now while we are waiting to know what insurance coverage may look like for him. He does work for [employer], so will need to [employer] to make sure he can get Cabenuva at the clinic if he did want to move forward with it. I did have him sign the [facility] contract so that if he wants to move forward that aspect is covered. Will plan to follow-up with him after insurance coverage is determined to see if he would like to move forward with [name] or continue on Biktarvy.  Plan: - Continue Biktarvy while waiting for insurance approval - Will reach out to  obtain final treatment decision after insurance check - Call with any issues or questions  Lennie Muckle, PharmD PGY1 Pharmacy Resident 07/21/2023 4:59 PM

## 2023-07-21 NOTE — Progress Notes (Signed)
[facility]     HPI: Douglas [name] is a 42 y.o. male Hiv on 43. Misses 1 dose/month Date of diagnosis 2014, found + at plasma donation ART exposure stribilid Past OIs Risk factors: MSM Partners in last 2months 1, in the last 12 months1.  Anal sex receptivey, insertivey. Contraception none. HIV + partenr  Social: Occupation: Claims Housing: house, by self Support: Sport and exercise psychologist,  Understanding of HIV: Etoh/drug/tobacco use: binge on weeknds/somkes ciggerets/ no  Past Medical History:  Diagnosis Date   Alcohol abuse    Anxiety    Back pain    Chest pain    Constipation    Family history of heart disease in male family member before age 58    Food allergy    High cholesterol    HIV infection (HCC) 2014   Hypertension 2004   Lactose intolerance    Low HDL (under 40)    Prediabetes    Seasonal allergic rhinitis    weather changes, spring/fall   Sickle cell trait (HCC)    Sleep apnea     Past Surgical History:  Procedure Laterality Date   NO PAST SURGERIES  03/2019    Family History  Problem Relation Age of Onset   Diabetes Mother    Hyperlipidemia Mother    Hypertension Mother    Stroke Mother    Kidney disease Mother    Depression Mother    Drug abuse Mother    Obesity Mother    Heart disease Father 63       died of MI   Diabetes Father    Sudden death Father    Alcoholism Father    Obesity Father    Hypertension Sister    Diabetes Sister        type 2   Hypertension Maternal Grandmother    Stroke Maternal Grandmother    Hypertension Maternal Grandfather    Cancer Paternal Grandmother        lung   Heart disease Paternal Uncle        several uncles died in 33s with MI   Hypertension Paternal Uncle    Heart disease Paternal Uncle        MI in 53s   Current Outpatient Medications on File Prior to Visit  Medication Sig Dispense Refill   amLODipine (NORVASC) 5 MG tablet Take 1 tablet (5 mg total) by mouth  daily. 90 tablet 1   atorvastatin (LIPITOR) 10 MG tablet Take 1 tablet (10 mg total) by mouth daily. 30 tablet 11   bictegravir-emtricitabine-tenofovir AF (BIKTARVY) 50-200-25 MG TABS tablet TAKE 1 TABLET BY MOUTH 1 TIME A DAY. 30 tablet 3   losartan-hydrochlorothiazide (HYZAAR) 100-25 MG tablet Take 1 tablet by mouth daily. 90 tablet 1   Vitamin D, Ergocalciferol, (DRISDOL) 1.25 MG (50000 UNIT) CAPS capsule Take 1 capsule (50,000 Units total) by mouth every 7 (seven) days. 16 capsule 0   [DISCONTINUED] rosuvastatin (CRESTOR) 10 MG tablet Take 1 tablet (10 mg total) by mouth at bedtime. (Patient not taking: Reported on 05/18/2019) 90 tablet 0   No current facility-administered medications on file prior to visit.    Allergies  Allergen Reactions   Shellfish Allergy Anaphylaxis      Lab Results HIV 1 RNA Quant (Copies/mL)  Date Value  06/20/2022 Not Detected  06/04/2021 <20 (H)  01/01/2021 <20   CD4 T Cell Abs (/uL)  Date Value  06/20/2022 505  06/04/2021 578  01/01/2021  531   No results found for: "HIV1GENOSEQ" Lab Results  Component Value Date   WBC 6.2 03/06/2023   HGB 15.0 03/06/2023   HCT 44.4 03/06/2023   MCV 84 03/06/2023   PLT 247 03/06/2023    Lab Results  Component Value Date   CREATININE 1.26 03/06/2023   BUN 22 03/06/2023   NA 139 03/06/2023   K 3.8 03/06/2023   CL 100 03/06/2023   CO2 21 03/06/2023   Lab Results  Component Value Date   ALT 29 06/20/2022   AST 16 06/20/2022   ALKPHOS 52 09/15/2018   BILITOT 0.6 06/20/2022    Lab Results  Component Value Date   CHOL 174 05/06/2023   TRIG 118 05/06/2023   HDL 42 05/06/2023   LDLCALC 111 (H) 05/06/2023   Lab Results  Component Value Date   HAV POS (A) 06/17/2013   Lab Results  Component Value Date   HEPBSAG NEGATIVE 06/17/2013   HEPBSAB POS (A) 06/17/2013   Lab Results  Component Value Date   HCVAB NEGATIVE 06/17/2013   Lab Results  Component Value Date   CHLAMYDIAWP Negative  06/20/2022   N Negative 06/20/2022   No results found for: "GCPROBEAPT" No results found for: "QUANTGOLD"  Assessment/Plan #HIV -CD4 505, VLND, on 03/17/22  Plan -Follow up in   #Vaccination COVID Flu 2023 Monkeypox PCV 205/26/22 Meningitis 01/16/2017 HepA 2014 immune HEpB 2014x2->cab adnsab+ Tdap needs(06/10/23) Shingles  #Hx of tobacco abuse #Hx of Etoh abuse  #Health maintenance -Quantiferon today 07/21/23 -RPR today 07/21/23, hx of syphilllis PS PEN -HCV today 07/21/23 -GC urine today 07/21/23 -Lipid The 10-year ASCVD risk score (Arnett DK, et al., 2019) is: 10.9%   Values used to calculate the score:     Age: 65 years     Sex: Male     Is Non-Hispanic African American: Yes     Diabetic: No     Tobacco smoker: Yes     Systolic Blood Pressure: 135 mmHg     Is BP treated: Yes     HDL Cholesterol: 42 mg/dL     Total Cholesterol: 174 mg/dL   On atorvastatin -Dysplasia screen M->next visit     Danelle Earthly, MD [facility]  I have personally spent 45 minutes involved in face-to-face and non-face-to-face activities for this patient on the day of the visit. Professional time spent includes the following activities: Preparing to see the patient (review of tests), Obtaining and/or reviewing separately obtained history (admission/discharge record), Performing a medically appropriate examination and/or evaluation , Ordering medications/tests/procedures, referring and communicating with other health care professionals, Documenting clinical information in the EMR, Independently interpreting results (not separately reported), Communicating results to the patient/family/caregiver, Counseling and educating the patient/family/caregiver and Care coordination (not separately reported).

## 2023-07-22 ENCOUNTER — Encounter: Payer: Self-pay | Admitting: Pharmacist

## 2023-07-22 ENCOUNTER — Other Ambulatory Visit (HOSPITAL_COMMUNITY): Payer: Self-pay

## 2023-07-22 LAB — T-HELPER CELLS (CD4) COUNT (NOT AT ARMC)
CD4 % Helper T Cell: 48 % (ref 33–65)
CD4 T Cell Abs: 746 /uL (ref 400–1790)

## 2023-07-23 LAB — COMPLETE METABOLIC PANEL WITH GFR
AG Ratio: 1.8 (calc) (ref 1.0–2.5)
ALT: 38 U/L (ref 9–46)
AST: 17 U/L (ref 10–40)
Albumin: 4.8 g/dL (ref 3.6–5.1)
Alkaline phosphatase (APISO): 72 U/L (ref 36–130)
BUN: 18 mg/dL (ref 7–25)
CO2: 27 mmol/L (ref 20–32)
Calcium: 10.1 mg/dL (ref 8.6–10.3)
Chloride: 104 mmol/L (ref 98–110)
Creat: 1.24 mg/dL (ref 0.60–1.29)
Globulin: 2.6 g/dL (ref 1.9–3.7)
Glucose, Bld: 95 mg/dL (ref 65–99)
Potassium: 3.8 mmol/L (ref 3.5–5.3)
Sodium: 141 mmol/L (ref 135–146)
Total Bilirubin: 0.5 mg/dL (ref 0.2–1.2)
Total Protein: 7.4 g/dL (ref 6.1–8.1)
eGFR: 74 mL/min/{1.73_m2} (ref >=60)

## 2023-07-23 LAB — URINE CYTOLOGY ANCILLARY ONLY
Chlamydia: NEGATIVE
Comment: NEGATIVE
Comment: NORMAL
Neisseria Gonorrhea: NEGATIVE

## 2023-07-23 LAB — CBC WITH DIFFERENTIAL/PLATELET
Absolute Monocytes: 998 {cells}/uL — ABNORMAL HIGH (ref 200–950)
Basophils Absolute: 39 cells/uL (ref 0–200)
Basophils Relative: 0.5 %
Eosinophils Absolute: 156 {cells}/uL (ref 15–500)
Eosinophils Relative: 2 %
HCT: 46.1 % (ref 38.5–50.0)
Hemoglobin: 15.3 g/dL (ref 13.2–17.1)
Lymphs Abs: 1755 {cells}/uL (ref 850–3900)
MCH: 28 pg (ref 27.0–33.0)
MCHC: 33.2 g/dL (ref 32.0–36.0)
MCV: 84.4 fL (ref 80.0–100.0)
MPV: 11.5 fL (ref 7.5–12.5)
Monocytes Relative: 12.8 %
Neutro Abs: 4852 {cells}/uL (ref 1500–7800)
Neutrophils Relative %: 62.2 %
Platelets: 273 10*3/uL (ref 140–400)
RBC: 5.46 10*6/uL (ref 4.20–5.80)
RDW: 13.7 % (ref 11.0–15.0)
Total Lymphocyte: 22.5 %
WBC: 7.8 10*3/uL (ref 3.8–10.8)

## 2023-07-23 LAB — QUANTIFERON-TB GOLD PLUS
Mitogen-NIL: >10 [IU]/mL
NIL: 0.02 [IU]/mL
QuantiFERON-TB Gold Plus: NEGATIVE
TB1-NIL: 0.01 [IU]/mL
TB2-NIL: 0.07 [IU]/mL

## 2023-07-23 LAB — HIV-1 RNA QUANT-NO REFLEX-BLD
HIV 1 RNA Quant: NOT DETECTED {copies}/mL
HIV-1 RNA Quant, Log: NOT DETECTED {Log_copies}/mL

## 2023-07-23 LAB — RPR: RPR Ser Ql: NONREACTIVE

## 2023-07-23 LAB — HEPATITIS C ANTIBODY: Hepatitis C Ab: NONREACTIVE

## 2023-07-27 ENCOUNTER — Encounter (INDEPENDENT_AMBULATORY_CARE_PROVIDER_SITE_OTHER): Payer: Self-pay | Admitting: Internal Medicine

## 2023-08-08 MED ORDER — ERGOCALCIFEROL (VITAMIN D2) 1,250 MCG (50,000 UNIT) CAPSULE
ORAL_CAPSULE | ORAL | 0 refills | 112.00000 days
Start: 2023-08-08 — End: ?

## 2023-08-08 NOTE — Unmapped (Signed)
[facility] Delivery Pharm[facility] Refill Coordination Note    Specialty Medication(s) to be Shipped:   Infectious Disease: Biktarvy    Other medication(s) to be shipped:  amlodipine, atorvastatin, Vit D     Brandon Solis, DOB: 25-Aug-1981  Phone: [phone] (home)       All above HIPAA information was verified with patient.     Was a Nurse, learning disability used for this call? No    Completed refill call assessment today to schedule patient's medication shipment from the [facility] and [facility] Delivery Pharmacy  [phone]).  All relevant notes have been reviewed.     Specialty medication(s) and dose(s) confirmed: Regimen is correct and unchanged.   Changes to medications: Williard reports no changes at this time.  Changes to insurance: No  New side effects reported not previously addressed with a pharmacist or physician: None reported  Questions for the pharmacist: No    Confirmed patient received a Conservation officer, historic buildings and a Surveyor, mining with first shipment. The patient will receive a drug information handout for each medication shipped and additional FDA Medication Guides as required.       DISEASE/MEDICATION-SPECIFIC INFORMATION        N/A    SPECIALTY MEDICATION ADHERENCE     Medication Adherence    Patient reported X missed doses in the last month: 0  Specialty Medication: BIKTARVY 50-200-25 mg tablet (bictegrav-emtricit-tenofov ala)  Patient is on additional specialty medications: No  Patient is on more than two specialty medications: No  Any gaps in refill history greater than 2 weeks in the last 3 months: no  Demonstrates understanding of importance of adherence: yes  Informant: patient  Confirmed plan for next specialty medication refill: delivery by pharmacy  Refills needed for supportive medications: not needed          Refill Coordination    Has the Patients' Contact Information Changed: No  Is the Shipping Address Different: No         Were doses missed due to medication being on hold? No    BIKTARVY 50-200-25   mg: 5 days of medicine on hand       REFERRAL TO PHARMACIST     Referral to the pharmacist: Not needed      [facility]     Shipping address confirmed in Epic.       Delivery Scheduled: Yes, Expected medication delivery date: 08/13/2023.     Medication will be delivered via UPS to the prescription address in Epic WAM.    Kerby Less   [facility]  Specialty Technician

## 2023-08-14 ENCOUNTER — Other Ambulatory Visit (INDEPENDENT_AMBULATORY_CARE_PROVIDER_SITE_OTHER): Payer: Self-pay | Admitting: Internal Medicine

## 2023-08-14 DIAGNOSIS — E559 Vitamin D deficiency, unspecified: Secondary | ICD-10-CM

## 2023-08-14 MED ORDER — ERGOCALCIFEROL (VITAMIN D2) 1,250 MCG (50,000 UNIT) CAPSULE
ORAL_CAPSULE | ORAL | 0 refills | 112 days
Start: 2023-08-14 — End: ?

## 2023-08-14 MED FILL — AMLODIPINE 5 MG TABLET: ORAL | 90 days supply | Qty: 90 | Fill #1

## 2023-08-14 MED FILL — BIKTARVY 50 MG-200 MG-25 MG TABLET: ORAL | 30 days supply | Qty: 30 | Fill #0

## 2023-08-14 MED FILL — ATORVASTATIN 10 MG TABLET: ORAL | 90 days supply | Qty: 90 | Fill #1

## 2023-08-14 NOTE — Unmapped (Signed)
Brandon Solis 's [place] shipment will be rescheduled as a result of system issue causing order to get stuck.   I have spoken with the patient  at [phone]  and communicated the delivery change. We will reschedule the medication for the delivery date that the patient agreed upon.  We have confirmed the delivery date as 9/20, via ups.

## 2023-08-25 ENCOUNTER — Ambulatory Visit: Payer: BC Managed Care – PPO | Admitting: Nurse Practitioner

## 2023-09-01 NOTE — Unmapped (Signed)
[facility] Refill Coordination Note    Brandon Solis, [place]: 07/27/1981  Phone: [phone] (home)       All above HIPAA information was verified with patient.         09/01/2023     9:43 AM   Specialty Rx Medication Refill Questionnaire   Which Medications would you like refilled and shipped? Biktarvy   Please list all current allergies: Shellfish   Have you missed any doses in the last 30 days? No   Have you had any changes to your medication(s) since your last refill? No   How many days remaining of each medication do you have at home? 10   Have you experienced any side effects in the last 30 days? No   Please enter the full address (street address, city, state, zip code) where you would like your medication(s) to be delivered to. [address]   Please specify on which day you would like your medication(s) to arrive. Note: if you need your medication(s) within 3 days, please call the pharmacy to schedule your order at [phone]  09/08/2023   Has your insurance changed since your last refill? No   Would you like a pharmacist to call you to discuss your medication(s)? No   Do you require a signature for your package? (Note: if we are billing Medicare Part B or your order contains a controlled substance, we will require a signature) No         Completed refill call assessment today to schedule patient's medication shipment from the [facility] [phone]).  All relevant notes have been reviewed.       Confirmed patient received a Conservation officer, historic buildings and a Surveyor, mining with first shipment. The patient will receive a drug information handout for each medication shipped and additional FDA Medication Guides as required.         REFERRAL TO PHARMACIST     Referral to the pharmacist: Not needed      [facility]     Shipping address confirmed in Epic.     Delivery Scheduled: Yes, Expected medication delivery date: 09/08/23.     Medication will be delivered via UPS to the prescription address in Epic WAM.    Kerby Less   [facility] Specialty Technician

## 2023-09-03 ENCOUNTER — Ambulatory Visit: Payer: BC Managed Care – PPO | Admitting: Neurology

## 2023-09-03 ENCOUNTER — Encounter: Payer: Self-pay | Admitting: Neurology

## 2023-09-03 ENCOUNTER — Encounter: Payer: Self-pay | Admitting: Nurse Practitioner

## 2023-09-03 ENCOUNTER — Ambulatory Visit: Payer: BC Managed Care – PPO | Admitting: Nurse Practitioner

## 2023-09-03 VITALS — BP 135/84 | HR 63 | Ht 72.0 in | Wt 275.0 lb

## 2023-09-03 VITALS — BP 120/80 | HR 62 | Temp 98.3°F | Ht 72.0 in | Wt 275.2 lb

## 2023-09-03 DIAGNOSIS — R7303 Prediabetes: Secondary | ICD-10-CM

## 2023-09-03 DIAGNOSIS — R0683 Snoring: Secondary | ICD-10-CM | POA: Insufficient documentation

## 2023-09-03 DIAGNOSIS — E66812 Obesity, class 2: Secondary | ICD-10-CM | POA: Diagnosis not present

## 2023-09-03 DIAGNOSIS — B20 Human immunodeficiency virus [HIV] disease: Secondary | ICD-10-CM | POA: Diagnosis not present

## 2023-09-03 DIAGNOSIS — E7800 Pure hypercholesterolemia, unspecified: Secondary | ICD-10-CM

## 2023-09-03 DIAGNOSIS — I10 Essential (primary) hypertension: Secondary | ICD-10-CM

## 2023-09-03 DIAGNOSIS — Z23 Encounter for immunization: Secondary | ICD-10-CM

## 2023-09-03 NOTE — Progress Notes (Signed)
[name], CMA,acting as a Neurosurgeon for [name], [name].,have documented all relevant documentation on the behalf of [name], [name],as directed by  [name], [name] while in the presence of [name], [name].  Subjective:  Patient ID: Douglas [name] , male    DOB: 10/13/1981 , 42 y.o.   MRN: [id number]  Chief Complaint  Patient presents with   Hypertension    HPI  Patient presents today for a bp and dm follow up, Patient reports compliance with medication. Patient denies any chest pain, SOB, or headaches. Patient has no concerns today. Patient thinks he would benefit from a stand up walking desk since he is now working from home.   BP Readings from Last 3 Encounters: 09/03/23 : 118/82 07/21/23 : 135/84 06/23/23 : 134/86   Wt Readings from Last 3 Encounters: 09/03/23 : 275 lb 3.2 oz (124.8 kg) 07/21/23 : 277 lb (125.6 kg) 06/23/23 : 277 lb (125.6 kg)  He is eating a low carb diet since the end of last month, he does have a difficult time with weekends. He is exercising daily during the week - walking around the neighborhood and trying to get 10,000 steps a day, doing resistance training     Past Medical History:  Diagnosis Date   Alcohol abuse    Anxiety    Back pain    Chest pain    Constipation    Family history of heart disease in male family member before age 85    Food allergy    High cholesterol    HIV infection (HCC) 2014   Hypertension 2004   Lactose intolerance    Low HDL (under 40)    Prediabetes    Seasonal allergic rhinitis    weather changes, spring/fall   Sickle cell trait (HCC)    Sleep apnea      Family History  Problem Relation Age of Onset   Diabetes Mother    Hyperlipidemia Mother    Hypertension Mother    Stroke Mother    Kidney disease Mother    Depression Mother    Drug abuse Mother    Obesity Mother    Heart disease Father 16       died of MI   Diabetes Father    Sudden death Father    Alcoholism Father    Obesity Father     Hypertension Sister    Diabetes Sister        type 2   Hypertension Maternal Grandmother    Stroke Maternal Grandmother    Hypertension Maternal Grandfather    Cancer Paternal Grandmother        lung   Heart disease Paternal Uncle        several uncles died in 72s with MI   Hypertension Paternal Uncle    Heart disease Paternal Uncle        MI in 46s     Current Outpatient Medications:    amLODipine (NORVASC) 5 MG tablet, Take 1 tablet (5 mg total) by mouth daily., Disp: 90 tablet, Rfl: 1   atorvastatin (LIPITOR) 10 MG tablet, Take 1 tablet (10 mg total) by mouth daily., Disp: 30 tablet, Rfl: 11   bictegravir-emtricitabine-tenofovir AF (BIKTARVY) 50-200-25 MG TABS tablet, TAKE 1 TABLET BY MOUTH 1 TIME A DAY., Disp: 30 tablet, Rfl: 3   losartan-hydrochlorothiazide (HYZAAR) 100-25 MG tablet, Take 1 tablet by mouth daily., Disp: 90 tablet, Rfl: 1   Vitamin D, Ergocalciferol, (DRISDOL) 1.25 MG (50000 UNIT) CAPS  capsule, Take 1 capsule (50,000 Units total) by mouth every 7 (seven) days., Disp: 16 capsule, Rfl: 0   Allergies  Allergen Reactions   Shellfish Allergy Anaphylaxis     Review of Systems  Constitutional: Negative.   HENT: Negative.    Eyes: Negative.   Respiratory: Negative.    Cardiovascular: Negative.   Gastrointestinal: Negative.   Neurological: Negative.   Psychiatric/Behavioral: Negative.       Today's Vitals   09/03/23 1133 09/03/23 1208  BP: 118/82 120/80  Pulse: 62   Temp: 98.3 F (36.8 C)   TempSrc: Oral   Weight: 275 lb 3.2 oz (124.8 kg)   Height: 6' (1.829 m)   PainSc: 0-No pain    Body mass index is 37.32 kg/m.  Wt Readings from Last 3 Encounters:  09/03/23 275 lb (124.7 kg)  09/03/23 275 lb 3.2 oz (124.8 kg)  07/21/23 277 lb (125.6 kg)      Objective:  Physical Exam Vitals reviewed.  Constitutional:      General: He is not in acute distress.    Appearance: Normal appearance. He is obese. He is not ill-appearing.  Cardiovascular:      Rate and Rhythm: Normal rate and regular rhythm.     Pulses: Normal pulses.     Heart sounds: Normal heart sounds. No murmur heard. Pulmonary:     Effort: Pulmonary effort is normal. No respiratory distress.     Breath sounds: Normal breath sounds. No wheezing.  Skin:    General: Skin is warm and dry.  Neurological:     General: No focal deficit present.     Mental Status: He is alert and oriented to person, place, and time.     Cranial Nerves: No cranial nerve deficit.     Motor: No weakness.  Psychiatric:        Mood and Affect: Mood normal.        Behavior: Behavior normal.        Thought Content: Thought content normal.        Judgment: Judgment normal.         Assessment And Plan:  Essential hypertension Assessment & Plan: Blood pressure is controlled, continue current medications.    Pure hypercholesterolemia Assessment & Plan: Cholesterol levels were slightly elevated at last visit. Continue statin, tolerating well.       Orders: -     Lipid panel  Prediabetes Assessment & Plan: HgbA1c is stable, continue focusing on lifestyle modifications.    Orders: -     Hemoglobin A1c  Need for influenza vaccination Assessment & Plan: Influenza vaccine administered Encouraged to take Tylenol as needed for fever or muscle aches.   Orders: -     Flu vaccine trivalent PF, 6mos and older(Flulaval,Afluria,Fluarix,Fluzone)  Need for Tdap vaccination Assessment & Plan: Tdap given in office.   Orders: -     Tdap vaccine greater than or equal to 7yo IM    Return for schedule HM in 3-4 months.  Patient was given opportunity to ask questions. Patient verbalized understanding of the plan and was able to repeat key elements of the plan. All questions were answered to their satisfaction.    Jeanell Sparrow, [name], have reviewed all documentation for this visit. The documentation on 09/03/23 for the exam, diagnosis, procedures, and orders are all accurate and complete.    IF YOU HAVE BEEN REFERRED TO A SPECIALIST, IT MAY TAKE 1-2 WEEKS TO SCHEDULE/PROCESS THE REFERRAL. IF YOU HAVE NOT HEARD FROM US/SPECIALIST  IN TWO WEEKS, PLEASE GIVE [place] A CALL AT [phone].

## 2023-09-03 NOTE — Patient Instructions (Signed)
Screening for Sleep Apnea  Sleep apnea is a condition in which breathing pauses or becomes shallow during sleep. Sleep apnea screening is a test to determine if you are at risk for sleep apnea. The test includes a series of questions. It will only takes a few minutes. Your health care provider may ask you to have this test in preparation for surgery or as part of a physical exam. What are the symptoms of sleep apnea? Common symptoms of sleep apnea include: Snoring. Waking up often at night. Daytime sleepiness. Pauses in breathing. Choking or gasping during sleep. Irritability. Forgetfulness. Trouble thinking clearly. Depression. Personality changes. Most people with sleep apnea do not know that they have it. What are the advantages of sleep apnea screening? Getting screened for sleep apnea can help: Ensure your safety. It is important for your health care providers to know whether or not you have sleep apnea, especially if you are having surgery or have other long-term (chronic) health conditions. Improve your health and allow you to get a better night's rest. Restful sleep can help you: Have more energy. Lose weight. Improve high blood pressure. Improve diabetes management. Prevent stroke. Prevent car accidents. What happens during the screening? Screening usually includes being asked a list of questions about your sleep quality. Some questions you may be asked include: Do you snore? Is your sleep restless? Do you have daytime sleepiness? Has a partner or spouse told you that you stop breathing during sleep? Have you had trouble concentrating or memory loss? What is your age? What is your neck circumference? To measure your neck, keep your back straight and gently wrap the tape measure around your neck. Put the tape measure at the middle of your neck, between your chin and collarbone. What is your sex assigned at birth? Do you have or are you being treated for high blood  pressure? If your screening test is positive, you are at risk for the condition. Further testing may be needed to confirm a diagnosis of sleep apnea. Where to find more information You can find screening tools online or at your health care clinic. For more information about sleep apnea screening and healthy sleep, visit these websites: Centers for Disease Control and Prevention: FootballExhibition.com.br American Sleep Apnea Association: www.sleepapnea.org Contact a health care provider if: You think that you may have sleep apnea. Summary Sleep apnea screening can help determine if you are at risk for sleep apnea. It is important for your health care providers to know whether or not you have sleep apnea, especially if you are having surgery or have other chronic health conditions. You may be asked to take a screening test for sleep apnea in preparation for surgery or as part of a physical exam. This information is not intended to replace advice given to you by your health care provider. Make sure you discuss any questions you have with your health care provider. Document Revised: 10/20/2020 Document Reviewed: 10/20/2020 Elsevier Patient Education  2024 Elsevier Inc.        ASSESSMENT AND PLAN 42 y.o. year old AA male  patient here with:    1) unspecific sleep concern, there has been weight gain and at  his heaviest ever. Gained about 35 pounds in 2-2.5 years. Developed prediabetes.   2) loud snoring in the past and a presumed to have started again   3) no EDS.   I will order a HST as a screening test or sleep apnea.     I plan to follow  up PRN  through our NP within 3-5 months if sleep apnea has been identified. .   I would like to thank [name], [name] and [name], [name] [address] [place],  [place] [address] for allowing me to meet with and to take care of this pleasant patient.

## 2023-09-03 NOTE — Progress Notes (Signed)
SLEEP MEDICINE CLINIC    Provider:  Melvyn Novas, MD  Primary Care Physician:  [name], [name] [address] [place] [address]     Referring Provider: [name], [name] [address] [place],  [place] [address]          Chief Complaint according to patient   Patient presents with:     New Patient (Initial Visit)           HISTORY OF PRESENT ILLNESS:  Douglas [name] is a 42 y.o. male patient who is seen upon referral on 09/03/2023 from NP Christell Constant for a sleep medicine evaluation .   Chief concern according to patient :  " I had apnea as a child age 61-6 , never had a tonsillectomy. It went away- I was a sleep walker, and I hold my breath now sometimes, as I gained weight"  I am snoring " I some times wake up from snoring.    I have the pleasure of seeing HEBER HOOG 09/03/23 a right -handed male with a possible sleep disorder.    Sleep relevant medical history: Sleep walking in childhood,  no Tonsillectomy, no thyroid disease , no sinus disease, no TBI.  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    Family medical /sleep history: No other family member with OSA. father was a loud snorer.    Social history:  Patient is working as a Counsellor, collections,  and lives in a household  alone. No pets,  Family status is single , without children.  The patient currently works from home  in daytime   Tobacco use- mild intermittent .  ETOH use ; weekends, 4-6,  Caffeine intake in form of Coffee( 2 in AM ) Soda( /) Tea ( /) No energy drinks Exercise in form of walking daily .  Gym-       Sleep habits are as follows: The patient's dinner time is between 5-6  PM. The patient goes to bed at 10-12  PM and continues to sleep for intervals of 2 hours  - wakes for 1 bathroom break, the first time at 3 AM.  Gets up to drink water, is not aware of apnea- and he wakes up feeling not tired- , The preferred sleep position is prone with the  support of 1 pillow  Dreams are reportedly frequent/vivid.   The patient wakes up spontaneously/ at 7 and 7.30-  AM is the usual rise time.  He reports  feeling refreshed or restored in AM, without  symptoms such as dry mouth, morning headaches, and residual fatigue.  Naps are  not taken.   Review of Systems: Out of a complete 14 system review, the patient complains of only the following symptoms, and all other reviewed systems are negative.:  Fatigue, sleepiness , snoring, fragmented sleep,   How likely are you to doze in the following situations: 0 = not likely, 1 = slight chance, 2 = moderate chance, 3 = high chance   Sitting and Reading? Watching Television? Sitting inactive in a public place (theater or meeting)? As a passenger in a car for an hour without a break? Lying down in the afternoon when circumstances permit? Sitting and talking to someone? Sitting quietly after lunch without alcohol? In a car, while stopped for a few minutes in traffic?   Total = 5/ 24 points   FSS endorsed at 13/ 63 points.   Depression? Denied.  Anxiety :  denied.   HIV positive for 10 years .   Social History   Socioeconomic History   Marital status: Significant Other    Spouse name: Not on file   Number of children: Not on file   Years of education: Not on file   Highest education level: 12th grade  Occupational History   Occupation: patient registration    Employer: [employer]   Occupation: Hospital doctor  Tobacco Use   Smoking status: Every Day    Current packs/day: 0.00    Average packs/day: 0.5 packs/day for 4.2 years (2.1 ttl pk-yrs)    Types: Cigarettes    Start date: 09/03/2013    Last attempt to quit: 11/14/2017    Years since quitting: 5.8   Smokeless tobacco: Never   Tobacco comments:    stress at his job, 1/2 pack a day on weekends only, trying to mentally decrease  Vaping Use   Vaping status: Never Used  Substance and Sexual Activity   Alcohol use:  Yes    Alcohol/week: 13.0 standard drinks of alcohol    Types: 8 Glasses of wine, 5 Shots of liquor per week   Drug use: No   Sexual activity: Yes    Partners: Male    Birth control/protection: Condom    Comment: accepted condoms  Other Topics Concern   Not on file  Social History Narrative   Psychologist, counselling, assist nursing with re-certifications, [facility].   Exercise - video programs.  Weight bearing exercise as well  03/2019   Social Determinants of Health   Financial Resource Strain: Low Risk  (05/15/2023)   Overall Financial Resource Strain (CARDIA)    Difficulty of Paying Living Expenses: Not very hard  Food Insecurity: Food Insecurity Present (05/15/2023)   Hunger Vital Sign    Worried About Running Out of Food in the Last Year: Never true    Ran Out of Food in the Last Year: Sometimes true  Transportation Needs: No Transportation Needs (05/15/2023)   PRAPARE - Administrator, Civil Service (Medical): No    Lack of Transportation (Non-Medical): No  Physical Activity: Unknown (05/15/2023)   Exercise Vital Sign    Days of Exercise per Week: 0 days    Minutes of Exercise per Session: Not on file  Stress: No Stress Concern Present (05/15/2023)   [name] of Occupational Health - Occupational Stress Questionnaire    Feeling of Stress : Only a little  Social Connections: Socially Isolated (05/15/2023)   Social Connection and Isolation Panel [NHANES]    Frequency of Communication with Friends and Family: More than three times a week    Frequency of Social Gatherings with Friends and Family: Twice a week    Attends Religious Services: Never    Database administrator or Organizations: No    Attends Engineer, structural: Not on file    Marital Status: Never married    Family History  Problem Relation Age of Onset   Diabetes Mother    Hyperlipidemia Mother    Hypertension Mother    Stroke Mother    Kidney disease Mother     Depression Mother    Drug abuse Mother    Obesity Mother    Heart disease Father 40       died of MI   Diabetes Father    Sudden death Father    Alcoholism Father    Obesity Father    Hypertension Sister  Diabetes Sister        type 2   Hypertension Maternal Grandmother    Stroke Maternal Grandmother    Hypertension Maternal Grandfather    Cancer Paternal Grandmother        lung   Heart disease Paternal Uncle        several uncles died in 46s with MI   Hypertension Paternal Uncle    Heart disease Paternal Uncle        MI in 26s    Past Medical History:  Diagnosis Date   Alcohol abuse    Anxiety    Back pain    Chest pain    Constipation    Family history of heart disease in male family member before age 81    Food allergy    High cholesterol    HIV infection (HCC) 2014   Hypertension 2004   Lactose intolerance    Low HDL (under 40)    Prediabetes    Seasonal allergic rhinitis    weather changes, spring/fall   Sickle cell trait (HCC)    Sleep apnea     Past Surgical History:  Procedure Laterality Date   NO PAST SURGERIES  03/2019     Current Outpatient Medications on File Prior to Visit  Medication Sig Dispense Refill   amLODipine (NORVASC) 5 MG tablet Take 1 tablet (5 mg total) by mouth daily. 90 tablet 1   atorvastatin (LIPITOR) 10 MG tablet Take 1 tablet (10 mg total) by mouth daily. 30 tablet 11   bictegravir-emtricitabine-tenofovir AF (BIKTARVY) 50-200-25 MG TABS tablet TAKE 1 TABLET BY MOUTH 1 TIME A DAY. 30 tablet 3   losartan-hydrochlorothiazide (HYZAAR) 100-25 MG tablet Take 1 tablet by mouth daily. 90 tablet 1   Vitamin D, Ergocalciferol, (DRISDOL) 1.25 MG (50000 UNIT) CAPS capsule Take 1 capsule (50,000 Units total) by mouth every 7 (seven) days. 16 capsule 0   [DISCONTINUED] rosuvastatin (CRESTOR) 10 MG tablet Take 1 tablet (10 mg total) by mouth at bedtime. (Patient not taking: Reported on 05/18/2019) 90 tablet 0   No current  facility-administered medications on file prior to visit.    Allergies  Allergen Reactions   Shellfish Allergy Anaphylaxis     DIAGNOSTIC DATA (LABS, IMAGING, TESTING) - I reviewed patient records, labs, notes, testing and imaging myself where available.  Lab Results  Component Value Date   WBC 7.8 07/21/2023   HGB 15.3 07/21/2023   HCT 46.1 07/21/2023   MCV 84.4 07/21/2023   PLT 273 07/21/2023      Component Value Date/Time   NA 141 07/21/2023 1626   NA 139 03/06/2023 1604   K 3.8 07/21/2023 1626   CL 104 07/21/2023 1626   CO2 27 07/21/2023 1626   GLUCOSE 95 07/21/2023 1626   BUN 18 07/21/2023 1626   BUN 22 03/06/2023 1604   CREATININE 1.24 07/21/2023 1626   CALCIUM 10.1 07/21/2023 1626   PROT 7.4 07/21/2023 1626   PROT 7.2 09/15/2018 1400   ALBUMIN 4.6 09/15/2018 1400   AST 17 07/21/2023 1626   ALT 38 07/21/2023 1626   ALKPHOS 52 09/15/2018 1400   BILITOT 0.5 07/21/2023 1626   BILITOT 0.4 09/15/2018 1400   GFRNONAA 86 01/01/2021 1123   GFRAA 100 01/01/2021 1123   Lab Results  Component Value Date   CHOL 174 05/06/2023   HDL 42 05/06/2023   LDLCALC 111 (H) 05/06/2023   TRIG 118 05/06/2023   CHOLHDL 4.6 03/06/2023   Lab Results  Component Value  Date   HGBA1C 6.0 (H) 03/06/2023   Lab Results  Component Value Date   VITAMINB12 408 05/06/2023   Lab Results  Component Value Date   TSH 1.230 03/06/2023    PHYSICAL EXAM:  Today's Vitals   09/03/23 1520  BP: 135/84  Pulse: 63  Weight: 275 lb (124.7 kg)  Height: 6' (1.829 m)   Body mass index is 37.3 kg/m.   Wt Readings from Last 3 Encounters:  09/03/23 275 lb (124.7 kg)  09/03/23 275 lb 3.2 oz (124.8 kg)  07/21/23 277 lb (125.6 kg)     Ht Readings from Last 3 Encounters:  09/03/23 6' (1.829 m)  09/03/23 6' (1.829 m)  07/21/23 6' (1.829 m)      General: The patient is awake, alert and appears not in acute distress. The patient is well groomed. Head: Normocephalic, atraumatic. Neck is  supple. Mallampati 2,  neck circumference:17 inches .  Nasal airflow patent.  Retrognathia is not seen.  Dental status: biological  Cardiovascular:  Regular rate and cardiac rhythm by pulse,  without distended neck veins. Respiratory: Lungs are clear to auscultation.  Skin:  Without evidence of ankle edema, or rash. Trunk: The patient's posture is erect.   NEUROLOGIC EXAM: The patient is awake and alert, oriented to place and time.   Memory subjective described as intact.  Attention span & concentration ability appears normal.  Speech is fluent,  without  dysarthria, dysphonia or aphasia.  Mood and affect are appropriate.   Cranial nerves: no loss of smell or taste reported  Pupils are equal and briskly reactive to light. Funduscopic exam deferred/ .  Extraocular movements in vertical and horizontal planes were intact and without nystagmus. No Diplopia. Visual fields by finger perimetry are intact. Hearing was intact to soft voice and finger rubbing.    Facial sensation intact to fine touch.  Facial motor strength is symmetric and tongue and uvula move midline.  Neck ROM : rotation, tilt and flexion extension were normal for age and shoulder shrug was symmetrical.    Motor exam:  Symmetric bulk, tone and ROM.   Normal tone without cog-wheeling, symmetric grip strength .   Sensory:  Fine touch, pinprick and vibration were tested  and  normal.  Proprioception tested in the upper extremities was normal.   Coordination: no change in penmanship.  Rapid alternating movements in the fingers/hands were of normal speed. 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  The Finger-to-nose maneuver was intact without evidence of ataxia, dysmetria or tremor.   Gait and station: Patient could rise unassisted from a seated position, walked without assistive device.  Stance is of normal width/ base. Toe and heel walk were deferred.  Deep tendon reflexes: in the  upper and lower extremities are symmetrically  attenuated  . intact.    ASSESSMENT AND PLAN 42 y.o. year old AA male  patient here with:    1) unspecific sleep concern, there has been weight gain and at  his heaviest ever. Gained about 35 pounds in 2-2.5 years. Developed prediabetes.   2) loud snoring in the past and a presumed to have started again   3) no EDS.   I will order a HST as a screening test or sleep apnea.     I plan to follow up PRN  through our NP within 3-5 months if sleep apnea has been identified. .   I would like to thank [name], [name] and [name], [name] [address] [place]  [address] [place],  [place] [address] for allowing me to meet with and to take care of this pleasant patient.    After spending a total time of  35  minutes face to face and additional time for physical and neurologic examination, review of laboratory studies,  personal review of imaging studies, reports and results of other testing and review of referral information / records as far as provided in visit,   Electronically signed by: Melvyn Novas, MD 09/03/2023 3:46 PM  [facility] Sleep Board certified by The [employer] of Sleep Medicine and Diplomate of the [employer] of Sleep Medicine. Board certified In Neurology through the ABPN, Fellow of the [employer] of Neurology.

## 2023-09-04 LAB — HEMOGLOBIN A1C
Est. average glucose Bld gHb Est-mCnc: 126 mg/dL
Hgb A1c MFr Bld: 6 % — ABNORMAL HIGH (ref 4.8–5.6)

## 2023-09-04 LAB — LIPID PANEL
Chol/HDL Ratio: 3.4 {ratio} (ref 0.0–5.0)
Cholesterol, Total: 142 mg/dL (ref 100–199)
HDL: 42 mg/dL (ref >39)
LDL Chol Calc (NIH): 82 mg/dL (ref 0–99)
Triglycerides: 93 mg/dL (ref 0–149)
VLDL Cholesterol Cal: 18 mg/dL (ref 5–40)

## 2023-09-05 MED FILL — BIKTARVY 50 MG-200 MG-25 MG TABLET: ORAL | 30 days supply | Qty: 30 | Fill #1

## 2023-09-09 ENCOUNTER — Encounter: Payer: Self-pay | Admitting: Nurse Practitioner

## 2023-09-14 ENCOUNTER — Encounter: Payer: Self-pay | Admitting: Nurse Practitioner

## 2023-09-14 DIAGNOSIS — Z23 Encounter for immunization: Secondary | ICD-10-CM | POA: Insufficient documentation

## 2023-09-14 DIAGNOSIS — I10 Essential (primary) hypertension: Secondary | ICD-10-CM | POA: Insufficient documentation

## 2023-09-14 NOTE — Assessment & Plan Note (Signed)
Cholesterol levels were slightly elevated at last visit.  Continue statin, tolerating well.

## 2023-09-14 NOTE — Assessment & Plan Note (Signed)
Tdap given in office

## 2023-09-14 NOTE — Assessment & Plan Note (Signed)
Influenza vaccine administered Encouraged to take Tylenol as needed for fever or muscle aches.

## 2023-09-14 NOTE — Assessment & Plan Note (Signed)
HgbA1c is stable, continue focusing on lifestyle modifications.

## 2023-09-14 NOTE — Assessment & Plan Note (Signed)
Blood pressure is controlled, continue current medications

## 2023-09-17 ENCOUNTER — Encounter: Payer: Self-pay | Admitting: Nurse Practitioner

## 2023-09-23 ENCOUNTER — Other Ambulatory Visit: Payer: Self-pay | Admitting: Nurse Practitioner

## 2023-09-23 DIAGNOSIS — R7303 Prediabetes: Secondary | ICD-10-CM

## 2023-09-23 MED ORDER — METFORMIN 500 MG TABLET
ORAL_TABLET | Freq: Every day | ORAL | 1 refills | 90 days
Start: 2023-09-23 — End: ?

## 2023-09-23 MED ORDER — METFORMIN HCL 500 MG PO TABS
500.0000 mg | ORAL_TABLET | Freq: Every day | ORAL | 1 refills | Status: DC
Start: 2023-09-23 — End: 2023-12-10

## 2023-09-25 MED FILL — METFORMIN 500 MG TABLET: ORAL | 90 days supply | Qty: 90 | Fill #0

## 2023-10-02 NOTE — Unmapped (Signed)
[facility] Refill Coordination Note    Brandon Solis, [place]: 1981-01-05  Phone: [phone] (home)       All above HIPAA information was verified with patient.         10/02/2023    12:55 PM   Specialty Rx Medication Refill Questionnaire   Which Medications would you like refilled and shipped? Biktarvy   Please list all current allergies: Shellfish   Have you missed any doses in the last 30 days? Yes   If Yes, how many doses have you missed ? 0-2   Have you had any changes to your medication(s) since your last refill? Yes   Please list your medication(s) changes below. Metformin was added   How many days remaining of each medication do you have at home? Not sure   Have you experienced any side effects in the last 30 days? No   Please enter the full address (street address, city, state, zip code) where you would like your medication(s) to be delivered to. [address] , [place] [address]   Please specify on which day you would like your medication(s) to arrive. Note: if you need your medication(s) within 3 days, please call the pharmacy to schedule your order at [phone]  10/03/2023   Has your insurance changed since your last refill? No   Would you like a pharmacist to call you to discuss your medication(s)? No   Do you require a signature for your package? (Note: if we are billing Medicare Part B or your order contains a controlled substance, we will require a signature) No         Completed refill call assessment today to schedule patient's medication shipment from the [facility] [phone]).  All relevant notes have been reviewed.       Confirmed patient received a Conservation officer, historic buildings and a Surveyor, mining with first shipment. The patient will receive a drug information handout for each medication shipped and additional FDA Medication Guides as required.         REFERRAL TO PHARMACIST     Referral to the pharmacist: Not needed      [facility]     Shipping address confirmed in Epic.     Delivery Scheduled: Yes, Expected medication delivery date: 10/06/23.     Medication will be delivered via UPS to the prescription address in Epic WAM.    Kerby Less   [facility] Specialty Technician

## 2023-10-03 MED FILL — BIKTARVY 50 MG-200 MG-25 MG TABLET: ORAL | 30 days supply | Qty: 30 | Fill #2

## 2023-10-08 ENCOUNTER — Other Ambulatory Visit (HOSPITAL_COMMUNITY): Payer: BC Managed Care – PPO | Attending: Oncology

## 2023-10-20 ENCOUNTER — Encounter: Payer: Self-pay | Admitting: Nurse Practitioner

## 2023-10-25 ENCOUNTER — Other Ambulatory Visit: Payer: Self-pay | Admitting: Nurse Practitioner

## 2023-10-25 DIAGNOSIS — I10 Essential (primary) hypertension: Secondary | ICD-10-CM

## 2023-10-25 MED ORDER — LOSARTAN 100 MG-HYDROCHLOROTHIAZIDE 25 MG TABLET
ORAL_TABLET | Freq: Every day | ORAL | 1 refills | 90 days
Start: 2023-10-25 — End: ?

## 2023-10-27 ENCOUNTER — Encounter: Payer: Self-pay | Admitting: Nurse Practitioner

## 2023-10-27 ENCOUNTER — Other Ambulatory Visit: Payer: Self-pay

## 2023-10-27 DIAGNOSIS — I10 Essential (primary) hypertension: Secondary | ICD-10-CM

## 2023-10-27 MED ORDER — LOSARTAN 100 MG-HYDROCHLOROTHIAZIDE 25 MG TABLET
ORAL_TABLET | Freq: Every day | ORAL | 1 refills | 90 days
Start: 2023-10-27 — End: ?

## 2023-10-27 MED ORDER — LOSARTAN POTASSIUM-HCTZ 100-25 MG PO TABS: 1.0000 | ORAL_TABLET | Freq: Every day | ORAL | 1 refills | Status: DC

## 2023-10-28 MED FILL — LOSARTAN 100 MG-HYDROCHLOROTHIAZIDE 25 MG TABLET: ORAL | 90 days supply | Qty: 90 | Fill #0

## 2023-10-28 MED FILL — AMLODIPINE 2.5 MG TABLET: ORAL | 30 days supply | Qty: 30 | Fill #2

## 2023-10-28 MED FILL — ERGOCALCIFEROL (VITAMIN D2) 1,250 MCG (50,000 UNIT) CAPSULE: ORAL | 28 days supply | Qty: 4 | Fill #1

## 2023-11-04 NOTE — Unmapped (Signed)
The [facility] has made a second and final attempt to reach this patient to refill the following medication:BIKTARVY 50-200-25 mg tablet (bictegrav-emtricit-tenofov ala).      We have left voicemails on the following phone numbers: [phone], have sent a text message to the following phone numbers: [phone], and have sent a Mychart questionnaire..    Dates contacted: 10/27/23 and 11/04/23  Last scheduled delivery: 10/03/23    The patient may be at risk of non-compliance with this medication. The patient should call the [facility] at [phone]  Option 4, then Option 4: Infectious Disease, Transplant to refill medication.    Brandon Solis   [facility] and [facility]

## 2023-11-05 ENCOUNTER — Ambulatory Visit: Payer: BC Managed Care – PPO | Admitting: Nurse Practitioner

## 2023-11-12 NOTE — Unmapped (Signed)
[facility] Specialty and Home Delivery Pharmacy Clinical Assessment & Refill Coordination Note    Brandon Solis, DOB: 1981/03/29  Phone: [phone] (home)     All above HIPAA information was verified with patient.     Was a Nurse, learning disability used for this call? No    Specialty Medication(s):   Infectious Disease: Biktarvy     Current Outpatient Medications   Medication Sig Dispense Refill    amlodipine (NORVASC) 2.5 MG tablet Take 1 tablet (2.5 mg total) by mouth daily. 30 tablet 11    amlodipine (NORVASC) 5 MG tablet Take 1 tablet (5 mg total) by mouth daily. 90 tablet 1    atorvastatin (LIPITOR) 10 MG tablet Take 1 tablet (10 mg total) by mouth daily. 30 tablet 11    bictegrav-emtricit-tenofov ala (BIKTARVY) 50-200-25 mg tablet TAKE 1 TABLET BY MOUTH 1 TIME A DAY. 30 tablet 3    bictegrav-emtricit-tenofov ala (BIKTARVY) 50-200-25 mg tablet Take 1 tablet by mouth daily. 30 tablet 3    EPINEPHrine (EPIPEN) 0.3 mg/0.3 mL injection Inject into the muscle.      ergocalciferol-1,250 mcg, 50,000 unit, (VITAMIN D2-1,250 MCG, 50,000 UNIT,) 1,250 mcg (50,000 unit) capsule Take 1 capsule (1,250 mcg total) by mouth once a week. 16 capsule 0    losartan-hydroCHLOROthiazide (HYZAAR) 100-25 mg per tablet Take 1 tablet by mouth daily. 90 tablet 1    losartan-hydroCHLOROthiazide (HYZAAR) 100-25 mg per tablet Take 1 tablet by mouth daily. 90 tablet 1    metFORMIN (GLUCOPHAGE) 500 MG tablet Take 1 tablet (500 mg total) by mouth daily with breakfast. 90 tablet 1    WEGOVY 0.25 MG/0.5 ML SUBCUTANEOUS PEN INJECTOR Inject 0.25 mg into the skin once a week. Use this dose for 1 month (4 shots) and then increase to next higher dose. 2 mL 0    WEGOVY 0.25 MG/0.5 ML SUBCUTANEOUS PEN INJECTOR Inject the contents of 1 pen (0.25 mg) into the skin once a week for 28 days. 2 mL 0     No current facility-administered medications for this visit.        Changes to medications: Brandon Solis reports no changes at this time.    Allergies   Allergen Reactions Shellfish Containing Products Anaphylaxis       Changes to allergies: No    SPECIALTY MEDICATION ADHERENCE     Biktarvy 50-200-25 mg: 9 days of medicine on hand       Medication Adherence    Patient reported X missed doses in the last month: 0  Specialty Medication: Biktarvy 50-200-25mg   Patient is on additional specialty medications: No  Any gaps in refill history greater than 2 weeks in the last 3 months: no  Demonstrates understanding of importance of adherence: yes  Informant: patient  Provider-estimated medication adherence level: good  Patient is at risk for Non-Adherence: No  Confirmed plan for next specialty medication refill: delivery by pharmacy  Refills needed for supportive medications: not needed          Specialty medication(s) dose(s) confirmed: Regimen is correct and unchanged.     Are there any concerns with adherence? No    Adherence counseling provided? Not needed    CLINICAL MANAGEMENT AND INTERVENTION      Clinical Benefit Assessment:    Do you feel the medicine is effective or helping your condition? Yes    HIV ASSOCIATED LABS:     No results found for: HIVRS, HIVCP, RCD4, ACD4  HIV-1 RNA quant-no reflex-bld  Order: [id number]  Component  Ref  Range & Units 3 mo ago   HIV 1 RNA Quant  Copies/mL Not Detected   HIV-1 RNA Quant, Log  Log cps/mL Not Detected   Comment: .  Reference Range:                            Not Detected     copies/mL                            Not Detected Log copies/mL  .  Marland Kitchen  The test was performed using Real-Time Polymerase Chain  Reaction.  .  .  Reportable Range: 20 copies/mL to 10,000,000 copies/mL  (1.30 Log copies/mL to 7.00 Log copies/mL).  .   Resulting Agency [facility][name] CHANTILLY     Specimen Collected: 07/21/23 16:26    Performed by: [facility] Last Resulted: 07/23/23 17:00     Clinical Benefit counseling provided? Labs from 07/21/23 show evidence of clinical benefit    Adverse Effects Assessment:    Are you experiencing any side effects? No    Are you experiencing difficulty administering your medicine? No    Quality of Life Assessment:    How many days over the past month did your HIV  keep you from your normal activities? For example, brushing your teeth or getting up in the morning. 0    Have you discussed this with your provider? Not needed    Acute Infection Status:    Acute infections noted within Epic:  No active infections  Patient reported infection: None    Therapy Appropriateness:    Is therapy appropriate based on current medication list, adverse reactions, adherence, clinical benefit and progress toward achieving therapeutic goals? Yes, therapy is appropriate and should be continued     DISEASE/MEDICATION-SPECIFIC INFORMATION      N/A    HIV: Not Applicable    PATIENT SPECIFIC NEEDS     Does the patient have any physical, cognitive, or cultural barriers? No    Is the patient high risk? No    Did the patient require a clinical intervention? No    Does the patient require physician intervention or other additional services (i.e., nutrition, smoking cessation, social work)? No    SOCIAL DETERMINANTS OF HEALTH     At the [facility], we have learned that life circumstances - like trouble affording food, housing, utilities, or transportation can affect the health of many of our patients.   That is why we wanted to ask: are you currently experiencing any life circumstances that are negatively impacting your health and/or quality of life? Patient declined to answer    Social Drivers of Health     Food Insecurity: Food Insecurity Present (05/15/2023)    Received from [facility]    Hunger Vital Sign     Worried About Running Out of Food in the Last Year: Never true     Ran Out of Food in the Last Year: Sometimes true   Internet Connectivity: Not on file   Housing/Utilities: Not on file   Tobacco Use: High Risk (09/14/2023)    Received from [facility]    Patient History     Smoking Tobacco Use: Every Day     Smokeless Tobacco Use: Never Passive Exposure: Not on file   Transportation Needs: No Transportation Needs (05/15/2023)    Received from [facility] - Transportation  Lack of Transportation (Medical): No     Lack of Transportation (Non-Medical): No   Alcohol Use: Not on file   Interpersonal Safety: Not on file   Physical Activity: Unknown (05/15/2023)    Received from [facility]    Exercise Vital Sign     Days of Exercise per Week: 0 days     Minutes of Exercise per Session: Not on file   Intimate Partner Violence: Not on file   Stress: No Stress Concern Present (05/15/2023)    Received from [facility] - Occupational Stress Questionnaire     Feeling of Stress : Only a little   Substance Use: Not on file (10/01/2023)   Social Connections: Socially Isolated (05/15/2023)    Received from [facility]    Social Connection and Isolation Panel [NHANES]     Frequency of Communication with Friends and Family: More than three times a week     Frequency of Social Gatherings with Friends and Family: Twice a week     Attends Religious Services: Never     Database administrator or Organizations: No     Attends Engineer, structural: Not on file     Marital Status: Never married   Physicist, medical Strain: Low Risk  (05/15/2023)    Received from [facility]    Overall Financial Resource Strain (CARDIA)     Difficulty of Paying Living Expenses: Not very hard   Depression: Not at risk (02/15/2022)    [facility]    PHQ-2     Patient Health Questionnaire-2 Score: 0   Health Literacy: Not on file       Would you be willing to receive help with any of the needs that you have identified today? Not applicable       SHIPPING     Specialty Medication(s) to be Shipped:   Infectious Disease: Biktarvy    Other medication(s) to be shipped:  atorvastatin 10mg      Changes to insurance: No    Delivery Scheduled: Yes, Expected medication delivery date: 12/20 or 12/23.     Medication will be delivered via UPS to the confirmed prescription address in [facility].    The patient will receive a drug information handout for each medication shipped and additional FDA Medication Guides as required.  Verified that patient has previously received a Conservation officer, historic buildings and a Surveyor, mining.    The patient or caregiver noted above participated in the development of this care plan and knows that they can request review of or adjustments to the care plan at any time.      All of the patient's questions and concerns have been addressed.    Roderic Palau, PharmD   [facility] Specialty and Home Delivery Pharmacy Specialty Pharmacist

## 2023-11-13 MED FILL — BIKTARVY 50 MG-200 MG-25 MG TABLET: ORAL | 30 days supply | Qty: 30 | Fill #3

## 2023-11-13 MED FILL — ATORVASTATIN 10 MG TABLET: ORAL | 90 days supply | Qty: 90 | Fill #2

## 2023-11-25 ENCOUNTER — Ambulatory Visit (INDEPENDENT_AMBULATORY_CARE_PROVIDER_SITE_OTHER): Payer: BC Managed Care – PPO | Admitting: Internal Medicine

## 2023-12-04 ENCOUNTER — Other Ambulatory Visit: Payer: Self-pay | Admitting: Internal Medicine

## 2023-12-04 ENCOUNTER — Other Ambulatory Visit: Payer: Self-pay | Admitting: Nurse Practitioner

## 2023-12-04 ENCOUNTER — Ambulatory Visit: Payer: BC Managed Care – PPO | Admitting: Nurse Practitioner

## 2023-12-04 VITALS — BP 110/88 | HR 67 | Temp 98.3°F | Ht 72.0 in | Wt 278.2 lb

## 2023-12-04 DIAGNOSIS — I10 Essential (primary) hypertension: Secondary | ICD-10-CM | POA: Diagnosis not present

## 2023-12-04 DIAGNOSIS — E7800 Pure hypercholesterolemia, unspecified: Secondary | ICD-10-CM | POA: Diagnosis not present

## 2023-12-04 DIAGNOSIS — Z6837 Body mass index (BMI) 37.0-37.9, adult: Secondary | ICD-10-CM

## 2023-12-04 DIAGNOSIS — B20 Human immunodeficiency virus [HIV] disease: Secondary | ICD-10-CM

## 2023-12-04 DIAGNOSIS — Z0000 Encounter for general adult medical examination without abnormal findings: Secondary | ICD-10-CM | POA: Diagnosis not present

## 2023-12-04 DIAGNOSIS — Z23 Encounter for immunization: Secondary | ICD-10-CM | POA: Diagnosis not present

## 2023-12-04 DIAGNOSIS — Z125 Encounter for screening for malignant neoplasm of prostate: Secondary | ICD-10-CM

## 2023-12-04 DIAGNOSIS — Z79899 Other long term (current) drug therapy: Secondary | ICD-10-CM

## 2023-12-04 DIAGNOSIS — Z720 Tobacco use: Secondary | ICD-10-CM

## 2023-12-04 DIAGNOSIS — E66812 Obesity, class 2: Secondary | ICD-10-CM

## 2023-12-04 DIAGNOSIS — R7303 Prediabetes: Secondary | ICD-10-CM | POA: Diagnosis not present

## 2023-12-04 LAB — POCT URINALYSIS DIP (CLINITEK)
Bilirubin, UA: NEGATIVE
Blood, UA: NEGATIVE
Glucose, UA: NEGATIVE mg/dL
Ketones, POC UA: NEGATIVE mg/dL
Leukocytes, UA: NEGATIVE
Nitrite, UA: NEGATIVE
POC PROTEIN,UA: NEGATIVE
Spec Grav, UA: 1.02 (ref 1.010–1.025)
Urobilinogen, UA: 0.2 U/dL
pH, UA: 6.5 (ref 5.0–8.0)

## 2023-12-04 MED ORDER — BICTEGRAVIR 50 MG-EMTRICITABINE 200 MG-TENOFOVIR ALAFENAM 25 MG TABLET
ORAL_TABLET | Freq: Every day | ORAL | 2 refills | 30.00 days
Start: 2023-12-04 — End: ?

## 2023-12-04 MED ORDER — ATORVASTATIN 10 MG TABLET
ORAL_TABLET | Freq: Every day | ORAL | 1 refills | 90.00 days
Start: 2023-12-04 — End: ?

## 2023-12-04 MED ORDER — AMLODIPINE 5 MG TABLET
ORAL_TABLET | Freq: Every day | ORAL | 1 refills | 90.00 days
Start: 2023-12-04 — End: ?

## 2023-12-04 MED ORDER — ATORVASTATIN CALCIUM 10 MG PO TABS: 10.0000 mg | ORAL_TABLET | Freq: Every day | ORAL | 1 refills | Status: DC

## 2023-12-04 MED ORDER — AMLODIPINE BESYLATE 5 MG PO TABS: 5.0000 mg | ORAL_TABLET | Freq: Every day | ORAL | 1 refills | Status: DC

## 2023-12-04 NOTE — Progress Notes (Signed)
 LILLETTE Kristeen JINNY Gladis, CMA,acting as a neurosurgeon for Gaines Ada, [name].,have documented all relevant documentation on the behalf of Gaines Ada, [name],as directed by  Gaines Ada, [name] while in the presence of Gaines Ada, [name].  Subjective:   Patient ID: Douglas [name] , male    DOB: Feb 15, 1981 , 43 y.o.   MRN: [id number]  Chief Complaint  Patient presents with   Annual Exam    HPI  Patient presents today for HM, Patient reports compliance with medication. Patient denies any chest pain, SOB, or headaches. Patient has no concerns today.      Past Medical History:  Diagnosis Date   Alcohol abuse    Allergy    Shell fish   Anxiety    Back pain    Chest pain    Constipation    Family history of heart disease in male family member before age 75    Food allergy    High cholesterol    HIV infection (HCC) 2014   Hypertension 2004   Lactose intolerance    Low HDL (under 40)    Prediabetes    Seasonal allergic rhinitis    weather changes, spring/fall   Sickle cell trait (HCC)    Sleep apnea      Family History  Problem Relation Age of Onset   Diabetes Mother    Hyperlipidemia Mother    Hypertension Mother    Stroke Mother    Kidney disease Mother    Depression Mother    Drug abuse Mother    Obesity Mother    Vision loss Mother    Heart disease Father 38       died of MI   Diabetes Father    Sudden death Father    Alcoholism Father    Obesity Father    Early death Father    Hypertension Sister    Diabetes Sister        type 2   Early death Sister    Hypertension Maternal Grandmother    Stroke Maternal Grandmother    Vision loss Maternal Grandmother    Hypertension Maternal Grandfather    Cancer Paternal Grandmother        lung   Heart disease Paternal Uncle        several uncles died in 64s with MI   Hypertension Paternal Uncle    Heart disease Paternal Uncle        MI in 41s     Current Outpatient Medications:    losartan -hydrochlorothiazide  (HYZAAR ) 100-25 MG  tablet, Take 1 tablet by mouth daily., Disp: 90 tablet, Rfl: 1   Vitamin D , Ergocalciferol , (DRISDOL ) 1.25 MG (50000 UNIT) CAPS capsule, Take 1 capsule (50,000 Units total) by mouth every 7 (seven) days., Disp: 16 capsule, Rfl: 0   amLODipine  (NORVASC ) 5 MG tablet, Take 1 tablet (5 mg total) by mouth daily., Disp: 90 tablet, Rfl: 1   amLODipine  (NORVASC ) 5 MG tablet, Take 1 tablet (5 mg total) by mouth daily., Disp: 90 tablet, Rfl: 1   atorvastatin  (LIPITOR) 10 MG tablet, Take 1 tablet (10 mg total) by mouth daily., Disp: 90 tablet, Rfl: 1   BIKTARVY  50-200-25 MG TABS tablet, TAKE 1 TABLET BY MOUTH 1 TIME A DAY., Disp: 30 tablet, Rfl: 2   metFORMIN  (GLUCOPHAGE ) 500 MG tablet, Take 1 tablet (500 mg total) by mouth 2 (two) times daily with a meal., Disp: 180 tablet, Rfl: 1   Allergies  Allergen Reactions   Shellfish Allergy Anaphylaxis  Men's preventive visit. Patient Health Questionnaire (PHQ-2) is  Flowsheet Row Office Visit from 12/04/2023 in [facility]  PHQ-2 Total Score 0     Patient is on a regular diet, feels like it is balanced. Exercising 3 days a week. Marital status: Significant Other. Relevant history for alcohol use is:  Social History   Substance and Sexual Activity  Alcohol Use Yes   Alcohol/week: 24.0 standard drinks of alcohol   Types: 14 Glasses of wine, 10 Shots of liquor per week   Comment: 10 oz wine a day   Relevant history for tobacco use is:  Social History   Tobacco Use  Smoking Status Some Days   Current packs/day: 0.00   Average packs/day: 0.5 packs/day for 10.0 years (5.0 ttl pk-yrs)   Types: Cigarettes   Start date: 09/03/2013   Last attempt to quit: 02/24/2023   Years since quitting: 0.8  Smokeless Tobacco Never  Tobacco Comments   I start and stop, and mostly smoke on the weekends now; 12/04/23 - infrequently  .   Review of Systems  Constitutional: Negative.   HENT: Negative.    Eyes: Negative.   Respiratory:  Negative.    Cardiovascular: Negative.   Gastrointestinal: Negative.   Endocrine: Negative.   Genitourinary: Negative.   Musculoskeletal: Negative.   Allergic/Immunologic: Negative.   Neurological: Negative.   Hematological: Negative.   Psychiatric/Behavioral: Negative.       Today's Vitals   12/04/23 1147  BP: 110/88  Pulse: 67  Temp: 98.3 F (36.8 C)  TempSrc: Oral  Weight: 278 lb 3.2 oz (126.2 kg)  Height: 6' (1.829 m)  PainSc: 0-No pain   Body mass index is 37.73 kg/m.  Wt Readings from Last 3 Encounters:  12/04/23 278 lb 3.2 oz (126.2 kg)  09/03/23 275 lb (124.7 kg)  09/03/23 275 lb 3.2 oz (124.8 kg)    Objective:  Physical Exam Vitals reviewed.  Constitutional:      General: He is not in acute distress.    Appearance: Normal appearance. He is obese.  HENT:     Head: Normocephalic and atraumatic.     Right Ear: Tympanic membrane, ear canal and external ear normal. There is no impacted cerumen.     Left Ear: Tympanic membrane, ear canal and external ear normal. There is no impacted cerumen.     Nose: Nose normal.     Mouth/Throat:     Mouth: Mucous membranes are moist.  Cardiovascular:     Rate and Rhythm: Normal rate and regular rhythm.     Pulses: Normal pulses.     Heart sounds: Normal heart sounds. No murmur heard. Pulmonary:     Effort: Pulmonary effort is normal. No respiratory distress.     Breath sounds: Normal breath sounds. No wheezing.  Abdominal:     General: Abdomen is flat. Bowel sounds are normal. There is no distension.     Palpations: Abdomen is soft.  Genitourinary:    Prostate: Normal.     Rectum: Guaiac result negative.  Musculoskeletal:        General: Normal range of motion.     Cervical back: Normal range of motion and neck supple. No rigidity.  Skin:    General: Skin is warm.     Capillary Refill: Capillary refill takes less than 2 seconds.  Neurological:     General: No focal deficit present.     Mental Status: He is alert  and oriented to person, place, and  time.     Cranial Nerves: No cranial nerve deficit.     Motor: No weakness.  Psychiatric:        Mood and Affect: Mood normal.        Behavior: Behavior normal.        Thought Content: Thought content normal.        Judgment: Judgment normal.        Assessment And Plan:    Encounter for annual health examination Assessment & Plan: Behavior modifications discussed and diet history reviewed.   Pt will continue to exercise regularly and modify diet with low GI, plant based foods and decrease intake of processed foods.  Recommend intake of daily multivitamin, Vitamin D , and calcium .  Recommend  for preventive screenings, as well as recommend immunizations that include influenza, TDAP    Essential hypertension Assessment & Plan: Blood pressure is controlled, continue current medications. EKG done with SR Right Atrial Enlargement HR 65  Orders: -     EKG 12-Lead -     POCT URINALYSIS DIP (CLINITEK) -     Microalbumin / creatinine urine ratio -     CMP14+EGFR -     amLODIPine  Besylate; Take 1 tablet (5 mg total) by mouth daily.  Dispense: 90 tablet; Refill: 1  Prediabetes Assessment & Plan: HgbA1c is stable, continue focusing on lifestyle modifications.    Orders: -     Hemoglobin A1c  Pure hypercholesterolemia Assessment & Plan: Cholesterol levels were slightly elevated at last visit. Continue statin, tolerating well.       Orders: -     Lipid panel -     Atorvastatin  Calcium ; Take 1 tablet (10 mg total) by mouth daily.  Dispense: 90 tablet; Refill: 1  Human immunodeficiency virus (HIV) disease (HCC) Assessment & Plan: Continue f/u with Infection Disease.    COVID-19 vaccine administered -     Pfizer Comirnaty Covid-19 Vaccine 14yrs & older  Obesity, Class II, BMI 35-39.9 Assessment & Plan: He is encouraged to strive for BMI less than 30 to decrease cardiac risk. Advised to aim for at least 150 minutes of exercise per  week.    Tobacco abuse Assessment & Plan: Smoking cessation instruction/counseling given:  counseled patient on the dangers of tobacco use, advised patient to stop smoking, and reviewed strategies to maximize success    Other long term (current) drug therapy -     CBC with Differential/Platelet  Encounter for prostate cancer screening -     PSA     Return for 1 year physical, 6 month bp check. Patient was given opportunity to ask questions. Patient verbalized understanding of the plan and was able to repeat key elements of the plan. All questions were answered to their satisfaction.   Gaines Ada, [name]  I, Gaines Ada, [name], have reviewed all documentation for this visit. The documentation on 12/04/23 for the exam, diagnosis, procedures, and orders are all accurate and complete.

## 2023-12-05 LAB — CBC WITH DIFFERENTIAL/PLATELET
Basophils Absolute: 0 10*3/uL (ref 0.0–0.2)
Basos: 1 %
EOS (ABSOLUTE): 0.2 10*3/uL (ref 0.0–0.4)
Eos: 3 %
Hematocrit: 47.6 % (ref 37.5–51.0)
Hemoglobin: 15.5 g/dL (ref 13.0–17.7)
Immature Grans (Abs): 0 10*3/uL (ref 0.0–0.1)
Immature Granulocytes: 0 %
Lymphocytes Absolute: 1.7 10*3/uL (ref 0.7–3.1)
Lymphs: 30 %
MCH: 27.8 pg (ref 26.6–33.0)
MCHC: 32.6 g/dL (ref 31.5–35.7)
MCV: 86 fL (ref 79–97)
Monocytes Absolute: 0.7 10*3/uL (ref 0.1–0.9)
Monocytes: 13 %
Neutrophils Absolute: 3.1 10*3/uL (ref 1.4–7.0)
Neutrophils: 53 %
Platelets: 271 10*3/uL (ref 150–450)
RBC: 5.57 x10E6/uL (ref 4.14–5.80)
RDW: 13.7 % (ref 11.6–15.4)
WBC: 5.7 10*3/uL (ref 3.4–10.8)

## 2023-12-05 LAB — LIPID PANEL
Chol/HDL Ratio: 2.6 {ratio} (ref 0.0–5.0)
Cholesterol, Total: 127 mg/dL (ref 100–199)
HDL: 49 mg/dL (ref >39)
LDL Chol Calc (NIH): 61 mg/dL (ref 0–99)
Triglycerides: 90 mg/dL (ref 0–149)
VLDL Cholesterol Cal: 17 mg/dL (ref 5–40)

## 2023-12-05 LAB — CMP14+EGFR
ALT: 49 [IU]/L — ABNORMAL HIGH (ref 0–44)
AST: 21 [IU]/L (ref 0–40)
Albumin: 5 g/dL (ref 4.1–5.1)
Alkaline Phosphatase: 88 [IU]/L (ref 44–121)
BUN/Creatinine Ratio: 12 (ref 9–20)
BUN: 13 mg/dL (ref 6–24)
Bilirubin Total: 0.3 mg/dL (ref 0.0–1.2)
CO2: 24 mmol/L (ref 20–29)
Calcium: 9.5 mg/dL (ref 8.7–10.2)
Chloride: 99 mmol/L (ref 96–106)
Creatinine, Ser: 1.07 mg/dL (ref 0.76–1.27)
Globulin, Total: 2.9 g/dL (ref 1.5–4.5)
Glucose: 85 mg/dL (ref 70–99)
Potassium: 4 mmol/L (ref 3.5–5.2)
Sodium: 141 mmol/L (ref 134–144)
Total Protein: 7.9 g/dL (ref 6.0–8.5)
eGFR: 89 mL/min/{1.73_m2} (ref >59)

## 2023-12-05 LAB — PSA: Prostate Specific Ag, Serum: 1.7 ng/mL (ref 0.0–4.0)

## 2023-12-05 LAB — MICROALBUMIN / CREATININE URINE RATIO
Creatinine, Urine: 63.1 mg/dL
Microalb/Creat Ratio: 32 mg/g{creat} — ABNORMAL HIGH (ref 0–29)
Microalbumin, Urine: 20.2 ug/mL

## 2023-12-05 LAB — HEMOGLOBIN A1C
Est. average glucose Bld gHb Est-mCnc: 131 mg/dL
Hgb A1c MFr Bld: 6.2 % — ABNORMAL HIGH (ref 4.8–5.6)

## 2023-12-05 MED FILL — AMLODIPINE 5 MG TABLET: ORAL | 90 days supply | Qty: 90 | Fill #0

## 2023-12-05 NOTE — Unmapped (Signed)
Brandon Solis 's [place] shipment will be delayed as a result of the medication is too soon to refill until 12/06/23.     I have reached out to the patient  at [phone]  and communicated the delay. We will wait for a call back from the patient to reschedule the delivery.  We have not confirmed the new delivery date.

## 2023-12-05 NOTE — Unmapped (Signed)
Brandon Solis has rescheduled his delivery to ship 12/08/2023

## 2023-12-05 NOTE — Unmapped (Signed)
[facility] Specialty and Home Delivery Pharmacy Refill Coordination Note    Brandon Solis, [name]: Oct 30, 1981  Phone: [phone] (home)       All above HIPAA information was verified with patient.         12/04/2023     3:15 PM   Specialty Rx Medication Refill Questionnaire   Which Medications would you like refilled and shipped? Biktarvy   Please list all current allergies: Shellfish   Have you missed any doses in the last 30 days? No   Have you had any changes to your medication(s) since your last refill? No   How many days remaining of each medication do you have at home? Don't know   Have you experienced any side effects in the last 30 days? No   Please enter the full address (street address, city, state, zip code) where you would like your medication(s) to be delivered to. [address], [name] [address]   Please specify on which day you would like your medication(s) to arrive. Note: if you need your medication(s) within 3 days, please call the pharmacy to schedule your order at [phone]  12/08/2023   Has your insurance changed since your last refill? No   Would you like a pharmacist to call you to discuss your medication(s)? No   Do you require a signature for your package? (Note: if we are billing Medicare Part B or your order contains a controlled substance, we will require a signature) No         Completed refill call assessment today to schedule patient's medication shipment from the [facility] Specialty and Home Delivery Pharmacy [phone]).  All relevant notes have been reviewed.       Confirmed patient received a Conservation officer, historic buildings and a Surveyor, mining with first shipment. The patient will receive a drug information handout for each medication shipped and additional FDA Medication Guides as required.         REFERRAL TO PHARMACIST     Referral to the pharmacist: Not needed      [facility]     Shipping address confirmed in Epic.     Delivery Scheduled: Yes, Expected medication delivery date: 12/08/23. Medication will be delivered via UPS to the prescription address in Epic WAM.    Brandon Solis   [facility] Specialty and Home Delivery Pharmacy Specialty Technician

## 2023-12-08 ENCOUNTER — Encounter: Payer: Self-pay | Admitting: Nurse Practitioner

## 2023-12-08 MED FILL — BIKTARVY 50 MG-200 MG-25 MG TABLET: ORAL | 30 days supply | Qty: 30 | Fill #0

## 2023-12-10 ENCOUNTER — Other Ambulatory Visit: Payer: Self-pay | Admitting: Nurse Practitioner

## 2023-12-10 DIAGNOSIS — R7303 Prediabetes: Secondary | ICD-10-CM

## 2023-12-10 MED ORDER — METFORMIN 500 MG TABLET
ORAL_TABLET | Freq: Two times a day (BID) | ORAL | 1 refills | 90.00 days
Start: 2023-12-10 — End: ?

## 2023-12-10 MED ORDER — METFORMIN HCL 500 MG PO TABS: 500.0000 mg | ORAL_TABLET | Freq: Two times a day (BID) | ORAL | 1 refills | Status: DC

## 2023-12-17 ENCOUNTER — Encounter: Payer: Self-pay | Admitting: Nurse Practitioner

## 2023-12-17 DIAGNOSIS — Z0000 Encounter for general adult medical examination without abnormal findings: Secondary | ICD-10-CM | POA: Insufficient documentation

## 2023-12-17 NOTE — Assessment & Plan Note (Signed)
HgbA1c is stable, continue focusing on lifestyle modifications.

## 2023-12-17 NOTE — Assessment & Plan Note (Addendum)
Blood pressure is controlled, continue current medications. EKG done with SR Right Atrial Enlargement HR 65

## 2023-12-17 NOTE — Assessment & Plan Note (Signed)
Continue f/u with Infection Disease.

## 2023-12-17 NOTE — Assessment & Plan Note (Signed)
Behavior modifications discussed and diet history reviewed.   Pt will continue to exercise regularly and modify diet with low GI, plant based foods and decrease intake of processed foods.  Recommend intake of daily multivitamin, Vitamin D, and calcium.  Recommend for preventive screenings, as well as recommend immunizations that include influenza, TDAP

## 2023-12-17 NOTE — Assessment & Plan Note (Signed)
Cholesterol levels were slightly elevated at last visit.  Continue statin, tolerating well.

## 2023-12-17 NOTE — Assessment & Plan Note (Signed)
He is encouraged to strive for BMI less than 30 to decrease cardiac risk. Advised to aim for at least 150 minutes of exercise per week.  

## 2023-12-17 NOTE — Assessment & Plan Note (Signed)
Smoking cessation instruction/counseling given:  counseled patient on the dangers of tobacco use, advised patient to stop smoking, and reviewed strategies to maximize success 

## 2023-12-19 MED FILL — METFORMIN 500 MG TABLET: ORAL | 90 days supply | Qty: 180 | Fill #0

## 2023-12-29 ENCOUNTER — Ambulatory Visit (INDEPENDENT_AMBULATORY_CARE_PROVIDER_SITE_OTHER): Payer: BC Managed Care – PPO | Admitting: Internal Medicine

## 2024-01-02 NOTE — Unmapped (Signed)
[facility] Specialty and Home Delivery Pharmacy Refill Coordination Note    Brandon Solis, [place]: 01/10/1981  Phone: [phone] (home)       All above HIPAA information was verified with patient.         12/30/2023     8:18 AM   Specialty Rx Medication Refill Questionnaire   Which Medications would you like refilled and shipped? Biktarvy   Please list all current allergies: Shellfish   Have you missed any doses in the last 30 days? Yes   If Yes, how many doses have you missed ? 0-2   Have you had any changes to your medication(s) since your last refill? No   How many days remaining of each medication do you have at home? Dont know   Have you experienced any side effects in the last 30 days? No   Please enter the full address (street address, city, state, zip code) where you would like your medication(s) to be delivered to. [address] , [place] [address]   Please specify on which day you would like your medication(s) to arrive. Note: if you need your medication(s) within 3 days, please call the pharmacy to schedule your order at [phone]  01/06/2024   Has your insurance changed since your last refill? No   Would you like a pharmacist to call you to discuss your medication(s)? No   Do you require a signature for your package? (Note: if we are billing Medicare Part B or your order contains a controlled substance, we will require a signature) No         Completed refill call assessment today to schedule patient's medication shipment from the [facility] Specialty and Home Delivery Pharmacy [phone]).  All relevant notes have been reviewed.       Confirmed patient received a Conservation officer, historic buildings and a Surveyor, mining with first shipment. The patient will receive a drug information handout for each medication shipped and additional FDA Medication Guides as required.         REFERRAL TO PHARMACIST     Referral to the pharmacist: Not needed      [facility]     Shipping address confirmed in Epic.     Delivery Scheduled: Yes, Expected medication delivery date: 01/06/24.     Medication will be delivered via UPS to the prescription address in Epic WAM.    Kerby Less   [facility] Specialty and Home Delivery Pharmacy Specialty Technician

## 2024-01-05 MED FILL — BIKTARVY 50 MG-200 MG-25 MG TABLET: ORAL | 30 days supply | Qty: 30 | Fill #1

## 2024-01-07 ENCOUNTER — Ambulatory Visit (INDEPENDENT_AMBULATORY_CARE_PROVIDER_SITE_OTHER): Payer: BC Managed Care – PPO | Admitting: Adult Health

## 2024-01-07 ENCOUNTER — Encounter: Payer: Self-pay | Admitting: Nurse Practitioner

## 2024-01-12 ENCOUNTER — Other Ambulatory Visit: Payer: Self-pay | Admitting: Nurse Practitioner

## 2024-01-12 DIAGNOSIS — E7800 Pure hypercholesterolemia, unspecified: Secondary | ICD-10-CM

## 2024-01-12 DIAGNOSIS — I10 Essential (primary) hypertension: Secondary | ICD-10-CM

## 2024-01-12 DIAGNOSIS — R7303 Prediabetes: Secondary | ICD-10-CM

## 2024-01-14 ENCOUNTER — Other Ambulatory Visit (INDEPENDENT_AMBULATORY_CARE_PROVIDER_SITE_OTHER): Payer: Self-pay | Admitting: Internal Medicine

## 2024-01-14 DIAGNOSIS — E559 Vitamin D deficiency, unspecified: Secondary | ICD-10-CM

## 2024-01-14 MED ORDER — ERGOCALCIFEROL (VITAMIN D2) 1,250 MCG (50,000 UNIT) CAPSULE
ORAL_CAPSULE | ORAL | 0 refills | 112.00 days
Start: 2024-01-14 — End: ?

## 2024-01-19 MED FILL — LOSARTAN 100 MG-HYDROCHLOROTHIAZIDE 25 MG TABLET: ORAL | 90 days supply | Qty: 90 | Fill #1

## 2024-01-21 ENCOUNTER — Ambulatory Visit: Payer: BC Managed Care – PPO | Admitting: Internal Medicine

## 2024-01-27 NOTE — Unmapped (Signed)
 [facility] Specialty and Home Delivery Pharmacy Refill Coordination Note    Brandon Solis, [place]: 01/19/1981  Phone: [phone] (home)       All above HIPAA information was verified with patient.         01/27/2024     8:55 AM   Specialty Rx Medication Refill Questionnaire   Which Medications would you like refilled and shipped? Biktarvy   Please list all current allergies: [name]   Have you missed any doses in the last 30 days? No   Have you had any changes to your medication(s) since your last refill? No   How many days remaining of each medication do you have at home? Im not sure   Have you experienced any side effects in the last 30 days? No   Please enter the full address (street address, city, state, zip code) where you would like your medication(s) to be delivered to. [address] , [name] [address]   Please specify on which day you would like your medication(s) to arrive. Note: if you need your medication(s) within 3 days, please call the pharmacy to schedule your order at [phone]  01/28/2024   Has your insurance changed since your last refill? No   Would you like a pharmacist to call you to discuss your medication(s)? No   Do you require a signature for your package? (Note: if we are billing Medicare Part B or your order contains a controlled substance, we will require a signature) No         Completed refill call assessment today to schedule patient's medication shipment from the [facility] Specialty and Home Delivery Pharmacy [phone]).  All relevant notes have been reviewed.       Confirmed patient received a Conservation officer, historic buildings and a Surveyor, mining with first shipment. The patient will receive a drug information handout for each medication shipped and additional FDA Medication Guides as required.         REFERRAL TO PHARMACIST     Referral to the pharmacist: Not needed      [facility]     Shipping address confirmed in Epic.     Delivery Scheduled: Yes, Expected medication delivery date: 01/29/2024.     Medication will be delivered via UPS to the prescription address in Epic WAM.    Brandon Solis   [facility] Specialty and Home Delivery Pharmacy Specialty Technician

## 2024-01-28 MED FILL — BIKTARVY 50 MG-200 MG-25 MG TABLET: ORAL | 30 days supply | Qty: 30 | Fill #2

## 2024-02-02 ENCOUNTER — Other Ambulatory Visit: Payer: Self-pay

## 2024-02-02 DIAGNOSIS — B20 Human immunodeficiency virus [HIV] disease: Secondary | ICD-10-CM

## 2024-02-02 DIAGNOSIS — Z113 Encounter for screening for infections with a predominantly sexual mode of transmission: Secondary | ICD-10-CM

## 2024-02-08 ENCOUNTER — Encounter: Payer: Self-pay | Admitting: Nurse Practitioner

## 2024-02-09 ENCOUNTER — Other Ambulatory Visit: Payer: Self-pay

## 2024-02-09 ENCOUNTER — Other Ambulatory Visit: Payer: BC Managed Care – PPO

## 2024-02-09 ENCOUNTER — Other Ambulatory Visit

## 2024-02-09 DIAGNOSIS — B20 Human immunodeficiency virus [HIV] disease: Secondary | ICD-10-CM

## 2024-02-09 DIAGNOSIS — Z113 Encounter for screening for infections with a predominantly sexual mode of transmission: Secondary | ICD-10-CM

## 2024-02-09 NOTE — Addendum Note (Signed)
 Addended by: Tressa Busman T on: 02/09/2024 08:27 AM   Modules accepted: Orders

## 2024-02-10 LAB — COMPLETE METABOLIC PANEL WITH GFR
AG Ratio: 1.7 (calc) (ref 1.0–2.5)
ALT: 32 U/L (ref 9–46)
AST: 17 U/L (ref 10–40)
Albumin: 4.8 g/dL (ref 3.6–5.1)
Alkaline phosphatase (APISO): 65 U/L (ref 36–130)
BUN: 11 mg/dL (ref 7–25)
CO2: 24 mmol/L (ref 20–32)
Calcium: 9.6 mg/dL (ref 8.6–10.3)
Chloride: 103 mmol/L (ref 98–110)
Creat: 1.11 mg/dL (ref 0.60–1.29)
Globulin: 2.9 g/dL (ref 1.9–3.7)
Glucose, Bld: 105 mg/dL — ABNORMAL HIGH (ref 65–99)
Potassium: 3.7 mmol/L (ref 3.5–5.3)
Sodium: 139 mmol/L (ref 135–146)
Total Bilirubin: 0.4 mg/dL (ref 0.2–1.2)
Total Protein: 7.7 g/dL (ref 6.1–8.1)
eGFR: 85 mL/min/{1.73_m2} (ref >=60)

## 2024-02-10 LAB — C. TRACHOMATIS/N. GONORRHOEAE RNA
C. trachomatis RNA, TMA: NOT DETECTED
N. gonorrhoeae RNA, TMA: NOT DETECTED

## 2024-02-11 LAB — CBC WITH DIFFERENTIAL/PLATELET
Absolute Lymphocytes: 1541 {cells}/uL (ref 850–3900)
Absolute Monocytes: 774 {cells}/uL (ref 200–950)
Basophils Absolute: 43 {cells}/uL (ref 0–200)
Basophils Relative: 0.6 %
Eosinophils Absolute: 220 {cells}/uL (ref 15–500)
Eosinophils Relative: 3.1 %
HCT: 44 % (ref 38.5–50.0)
Hemoglobin: 14.7 g/dL (ref 13.2–17.1)
MCH: 28 pg (ref 27.0–33.0)
MCHC: 33.4 g/dL (ref 32.0–36.0)
MCV: 83.8 fL (ref 80.0–100.0)
MPV: 10.7 fL (ref 7.5–12.5)
Monocytes Relative: 10.9 %
Neutro Abs: 4523 {cells}/uL (ref 1500–7800)
Neutrophils Relative %: 63.7 %
Platelets: 251 10*3/uL (ref 140–400)
RBC: 5.25 10*6/uL (ref 4.20–5.80)
RDW: 13.8 % (ref 11.0–15.0)
Total Lymphocyte: 21.7 %
WBC: 7.1 10*3/uL (ref 3.8–10.8)

## 2024-02-11 LAB — COMPREHENSIVE METABOLIC PANEL
AG Ratio: 1.7 (calc) (ref 1.0–2.5)
ALT: 32 U/L (ref 9–46)
AST: 17 U/L (ref 10–40)
Albumin: 4.8 g/dL (ref 3.6–5.1)
Alkaline phosphatase (APISO): 65 U/L (ref 36–130)
BUN: 11 mg/dL (ref 7–25)
CO2: 24 mmol/L (ref 20–32)
Calcium: 9.6 mg/dL (ref 8.6–10.3)
Chloride: 103 mmol/L (ref 98–110)
Creat: 1.11 mg/dL (ref 0.60–1.29)
Globulin: 2.9 g/dL (ref 1.9–3.7)
Glucose, Bld: 105 mg/dL — ABNORMAL HIGH (ref 65–99)
Potassium: 3.7 mmol/L (ref 3.5–5.3)
Sodium: 139 mmol/L (ref 135–146)
Total Bilirubin: 0.4 mg/dL (ref 0.2–1.2)
Total Protein: 7.7 g/dL (ref 6.1–8.1)
eGFR: 85 mL/min/{1.73_m2} (ref >=60)

## 2024-02-11 LAB — RPR: RPR Ser Ql: NONREACTIVE

## 2024-02-11 LAB — T-HELPER CELLS (CD4) COUNT (NOT AT ARMC)
Absolute CD4: 699 {cells}/uL (ref 490–1740)
CD4 T Helper %: 45 % (ref 30–61)
Total lymphocyte count: 1547 {cells}/uL (ref 850–3900)

## 2024-02-11 LAB — HIV-1 RNA QUANT-NO REFLEX-BLD
HIV 1 RNA Quant: NOT DETECTED {copies}/mL
HIV-1 RNA Quant, Log: NOT DETECTED {Log_copies}/mL

## 2024-02-18 MED FILL — ATORVASTATIN 10 MG TABLET: ORAL | 90 days supply | Qty: 90 | Fill #0

## 2024-02-18 MED FILL — AMLODIPINE 5 MG TABLET: ORAL | 90 days supply | Qty: 90 | Fill #1

## 2024-02-19 ENCOUNTER — Other Ambulatory Visit: Payer: Self-pay | Admitting: Internal Medicine

## 2024-02-19 DIAGNOSIS — B20 Human immunodeficiency virus [HIV] disease: Secondary | ICD-10-CM

## 2024-02-19 MED ORDER — BIKTARVY 50 MG-200 MG-25 MG TABLET
ORAL_TABLET | Freq: Every day | ORAL | 0 refills | 30 days
Start: 2024-02-19 — End: ?

## 2024-02-19 MED ORDER — BICTEGRAVIR 50 MG-EMTRICITABINE 200 MG-TENOFOVIR ALAFENAM 25 MG TABLET
ORAL_TABLET | Freq: Every day | ORAL | 2 refills | 30 days
Start: 2024-02-19 — End: ?

## 2024-02-20 NOTE — Unmapped (Addendum)
 [facility] Specialty and Home Delivery Pharmacy Refill Coordination Note    Confirmed new delivery date of 4/2 w/patient via inbasket message    Brandon Solis, DOB: [birth year]  Phone: [phone] (home)       All above HIPAA information was verified with patient.         02/19/2024    12:40 PM   Specialty Rx Medication Refill Questionnaire   Which Medications would you like refilled and shipped? Biktarvy   Please list all current allergies: Shellfish   Have you missed any doses in the last 30 days? No   Have you had any changes to your medication(s) since your last refill? No   How many days remaining of each medication do you have at home? Not sure   Have you experienced any side effects in the last 30 days? No   Please enter the full address (street address, city, state, zip code) where you would like your medication(s) to be delivered to. [address] [place] [address]   Please specify on which day you would like your medication(s) to arrive. Note: if you need your medication(s) within 3 days, please call the pharmacy to schedule your order at [phone]  02/20/2024   Has your insurance changed since your last refill? No   Would you like a pharmacist to call you to discuss your medication(s)? No   Do you require a signature for your package? (Note: if we are billing Medicare Part B or your order contains a controlled substance, we will require a signature) No   I have been provided my out of pocket cost for my medication and approve the pharmacy to charge the amount to my credit card on file. Yes         Completed refill call assessment today to schedule patient's medication shipment from the [facility] and Home Delivery Pharmacy [phone]).  All relevant notes have been reviewed.       Confirmed patient received a Conservation officer, historic buildings and a Surveyor, mining with first shipment. The patient will receive a drug information handout for each medication shipped and additional FDA Medication Guides as required.         REFERRAL TO PHARMACIST     Referral to the pharmacist: Not needed      [facility]     Shipping address confirmed in Epic.     Delivery Scheduled: Yes, Expected medication delivery date: 02/25/24.     Medication will be delivered via UPS to the prescription address in Epic WAM.    Darryl Nestle, PharmD   [facility] Specialty and Home Delivery Pharmacy Specialty Pharmacist

## 2024-02-23 ENCOUNTER — Ambulatory Visit: Payer: BC Managed Care – PPO | Admitting: Internal Medicine

## 2024-02-24 MED FILL — BIKTARVY 50 MG-200 MG-25 MG TABLET: ORAL | 30 days supply | Qty: 30 | Fill #0

## 2024-03-02 ENCOUNTER — Ambulatory Visit: Admitting: Internal Medicine

## 2024-03-02 ENCOUNTER — Encounter: Payer: Self-pay | Admitting: Internal Medicine

## 2024-03-02 ENCOUNTER — Other Ambulatory Visit: Payer: Self-pay

## 2024-03-02 VITALS — BP 145/88 | HR 77 | Temp 98.6°F | Ht 72.0 in | Wt 280.0 lb

## 2024-03-02 DIAGNOSIS — B20 Human immunodeficiency virus [HIV] disease: Secondary | ICD-10-CM

## 2024-03-02 DIAGNOSIS — F1011 Alcohol abuse, in remission: Secondary | ICD-10-CM

## 2024-03-02 DIAGNOSIS — Z87891 Personal history of nicotine dependence: Secondary | ICD-10-CM | POA: Diagnosis not present

## 2024-03-02 MED ORDER — BIKTARVY 50 MG-200 MG-25 MG TABLET
ORAL_TABLET | Freq: Every day | ORAL | 6 refills | 30.00 days
Start: 2024-03-02 — End: ?

## 2024-03-02 MED ORDER — BIKTARVY 50-200-25 MG PO TABS
ORAL_TABLET | ORAL | 6 refills | Status: DC
Start: 2024-03-02 — End: 2024-07-07

## 2024-03-02 NOTE — Progress Notes (Signed)
 [facility]     HPI: Douglas [name] is a 43 y.o. male presents for  Hiv on biktarvy. Misses 1 dose/month Date of diagnosis 2014, found + at plasma donation Today: doing well no new complaint.s ART exposure stribilid Past OIs Risk factors: MSM Partners in last 2months 1, in the last 12 months1.  Anal sex receptivey, insertivey. Contraception none. HIV + partenr   Social: Occupation: Claims Housing: house, by self Support: Sport and exercise psychologist,  Understanding of HIV: Etoh/drug/tobacco use: binge on weeknds/somkes ciggerets/ no  Past Medical History:  Diagnosis Date   Alcohol abuse    Allergy    Shell fish   Anxiety    Back pain    Chest pain    Constipation    Family history of heart disease in male family member before age 74    Food allergy    High cholesterol    HIV infection (HCC) 2014   Hypertension 2004   Lactose intolerance    Low HDL (under 40)    Prediabetes    Seasonal allergic rhinitis    weather changes, spring/fall   Sickle cell trait (HCC)    Sleep apnea     Past Surgical History:  Procedure Laterality Date   NO PAST SURGERIES  03/2019    Family History  Problem Relation Age of Onset   Diabetes Mother    Hyperlipidemia Mother    Hypertension Mother    Stroke Mother    Kidney disease Mother    Depression Mother    Drug abuse Mother    Obesity Mother    Vision loss Mother    Heart disease Father 35       died of MI   Diabetes Father    Sudden death Father    Alcoholism Father    Obesity Father    Early death Father    Hypertension Sister    Diabetes Sister        type 2   Early death Sister    Hypertension Maternal Grandmother    Stroke Maternal Grandmother    Vision loss Maternal Grandmother    Hypertension Maternal Grandfather    Cancer Paternal Grandmother        lung   Heart disease Paternal Uncle        several uncles died in 19s with MI   Hypertension Paternal Uncle    Heart disease Paternal Uncle         MI in 40s   Current Outpatient Medications on File Prior to Visit  Medication Sig Dispense Refill   atorvastatin (LIPITOR) 10 MG tablet Take 1 tablet (10 mg total) by mouth daily. 90 tablet 1   BIKTARVY 50-200-25 MG TABS tablet TAKE 1 TABLET BY MOUTH 1 TIME A DAY. 30 tablet 0   losartan-hydrochlorothiazide (HYZAAR) 100-25 MG tablet Take 1 tablet by mouth daily. 90 tablet 1   metFORMIN (GLUCOPHAGE) 500 MG tablet Take 1 tablet (500 mg total) by mouth 2 (two) times daily with a meal. 180 tablet 1   amLODipine (NORVASC) 5 MG tablet Take 1 tablet (5 mg total) by mouth daily. 90 tablet 1   amLODipine (NORVASC) 5 MG tablet Take 1 tablet (5 mg total) by mouth daily. (Patient not taking: Reported on 03/02/2024) 90 tablet 1   Vitamin D, Ergocalciferol, (DRISDOL) 1.25 MG (50000 UNIT) CAPS capsule Take 1 capsule (50,000 Units total) by mouth every 7 (seven) days. (Patient not taking: Reported on 03/02/2024)  16 capsule 0   [DISCONTINUED] rosuvastatin (CRESTOR) 10 MG tablet Take 1 tablet (10 mg total) by mouth at bedtime. (Patient not taking: Reported on 12/04/2023) 90 tablet 0   No current facility-administered medications on file prior to visit.    Allergies  Allergen Reactions   Shellfish Allergy Anaphylaxis      Lab Results HIV 1 RNA Quant  Date Value  02/09/2024 NOT DETECTED copies/mL  07/21/2023 Not Detected Copies/mL  06/20/2022 Not Detected Copies/mL   CD4 T Cell Abs (/uL)  Date Value  07/21/2023 746  06/20/2022 505  06/04/2021 578   No results found for: "HIV1GENOSEQ" Lab Results  Component Value Date   WBC 7.1 02/09/2024   HGB 14.7 02/09/2024   HCT 44.0 02/09/2024   MCV 83.8 02/09/2024   PLT 251 02/09/2024    Lab Results  Component Value Date   CREATININE 1.11 02/09/2024   CREATININE 1.11 02/09/2024   BUN 11 02/09/2024   BUN 11 02/09/2024   NA 139 02/09/2024   NA 139 02/09/2024   K 3.7 02/09/2024   K 3.7 02/09/2024   CL 103 02/09/2024   CL 103 02/09/2024   CO2 24  02/09/2024   CO2 24 02/09/2024   Lab Results  Component Value Date   ALT 32 02/09/2024   ALT 32 02/09/2024   AST 17 02/09/2024   AST 17 02/09/2024   ALKPHOS 88 12/04/2023   BILITOT 0.4 02/09/2024   BILITOT 0.4 02/09/2024    Lab Results  Component Value Date   CHOL 127 12/04/2023   TRIG 90 12/04/2023   HDL 49 12/04/2023   LDLCALC 61 12/04/2023   Lab Results  Component Value Date   HAV POS (A) 06/17/2013   Lab Results  Component Value Date   HEPBSAG NEGATIVE 06/17/2013   HEPBSAB POS (A) 06/17/2013   Lab Results  Component Value Date   HCVAB NEGATIVE 06/17/2013   Lab Results  Component Value Date   CHLAMYDIAWP Negative 07/21/2023   N Negative 07/21/2023   No results found for: "GCPROBEAPT" No results found for: "QUANTGOLD"  Assessment/Plan #HIV #HIV -[id number] on 8/24, VLND, on 02/09/24 on biktarvy  -spoke with pharmacy today, pt declined inj ageter counseling Plan Continue biktarvy HIV labs from 02/09/24 reviewed F.u in 6 months   #Vaccination COVID Flu 2023 Monkeypox PCV 205/26/22 Meningitis 01/16/2017 HepA 2014 immune HEpB 2014x2->cab adnsab+ Tdap needs(06/10/23) Shingles   #Hx of tobacco abuse #Hx of Etoh abuse   #Health maintenance -Quantiferon nr 07/21/23 -RPR nr on 01/1724, hx of syphilllis sp PEN -HCV today 07/21/23 -GC urine today 07/21/23 -Lipid The 10-year ASCVD risk score (Arnett DK, et al., 2019) is: 10.9%   Values used to calculate the score:     Age: 48 years     Sex: Male     Is Non-Hispanic African American: Yes     Diabetic: No     Tobacco smoker: Yes     Systolic Blood Pressure: 135 mmHg     Is BP treated: Yes     HDL Cholesterol: 42 mg/dL     Total Cholesterol: 174 mg/dL    On atorvastatin -Dysplasia screen M->today -GC urine negative 02/09/24   [name], MD [facility] I have personally spent 42 minutes involved in face-to-face and non-face-to-face activities for  this patient on the day of the visit. Professional time spent includes the following activities: Preparing to see the patient (review of tests), Obtaining and/or reviewing separately  obtained history (admission/discharge record), Performing a medically appropriate examination and/or evaluation , Ordering medications/tests/procedures, referring and communicating with other health care professionals, Documenting clinical information in the EMR, Independently interpreting results (not separately reported), Communicating results to the patient/family/caregiver, Counseling and educating the patient/family/caregiver and Care coordination (not separately reported).  e

## 2024-03-03 ENCOUNTER — Other Ambulatory Visit

## 2024-03-03 DIAGNOSIS — Z006 Encounter for examination for normal comparison and control in clinical research program: Secondary | ICD-10-CM

## 2024-03-04 LAB — CYTOLOGY - PAP
Adequacy: ABNORMAL
Comment: NEGATIVE

## 2024-03-10 ENCOUNTER — Ambulatory Visit: Payer: BC Managed Care – PPO | Admitting: Dietician

## 2024-03-16 NOTE — Unmapped (Addendum)
 I reviewed this patient case and all documentation provided by the learner and was readily available for consultation during their interaction with the patient.  I agree with the assessment and plan listed below.    This pharmacist was notified that this patient has reported that they've missed 1-2 doses of their Biktarvy .. I have reviewed the patient's medical record and have determined that no further pharmacist action is needed.    Approximate time spent: 0-5 minutes    Brandon Solis, PharmD   [facility] Specialty and Home Delivery Pharmacy Specialty Pharmacist      [facility] Specialty and Home Delivery Pharmacy Refill Coordination Note    - new delivery date of 4/25 confirmed w/pt    Brandon Solis, DOB: 08/20/1981  Phone: [phone] (home)       All above HIPAA information was verified with patient.         03/16/2024     3:11 PM   Specialty Rx Medication Refill Questionnaire   Which Medications would you like refilled and shipped? Biktarvy    Please list all current allergies: Shellfish   Have you missed any doses in the last 30 days? Yes   If Yes, how many doses have you missed ? 0-2   Have you had any changes to your medication(s) since your last refill? No   How many days remaining of each medication do you have at home? Not sure   Have you experienced any side effects in the last 30 days? No   Please enter the full address (street address, city, state, zip code) where you would like your medication(s) to be delivered to. [address], [place] [address]   Please specify on which day you would like your medication(s) to arrive. Note: if you need your medication(s) within 3 days, please call the pharmacy to schedule your order at [phone]  03/18/2024   Has your insurance changed since your last refill? No   Would you like a pharmacist to call you to discuss your medication(s)? No   Do you require a signature for your package? (Note: if we are billing Medicare Part B or your order contains a controlled substance, we will require a signature) No   I have been provided my out of pocket cost for my medication and approve the pharmacy to charge the amount to my credit card on file. Yes         Completed refill call assessment today to schedule patient's medication shipment from the [facility] and Home Delivery Pharmacy [phone]).  All relevant notes have been reviewed.       Confirmed patient received a Conservation officer, historic buildings and a Surveyor, mining with first shipment. The patient will receive a drug information handout for each medication shipped and additional FDA Medication Guides as required.         REFERRAL TO PHARMACIST     Referral to the pharmacist: Not needed      [facility]     Shipping address confirmed in Epic.     Delivery Scheduled: Yes, Expected medication delivery date: 4/25. Spoke with patient that earliest it can arrive is 4/25     Medication will be delivered via UPS to the prescription address in Epic WAM.    Brandon Solis   [facility] Specialty and Home Delivery Pharmacy Specialty Pharmacist

## 2024-03-18 MED FILL — BIKTARVY 50 MG-200 MG-25 MG TABLET: ORAL | 30 days supply | Qty: 30 | Fill #0

## 2024-03-22 LAB — GENECONNECT MOLECULAR SCREEN

## 2024-03-23 ENCOUNTER — Telehealth: Payer: Self-pay | Admitting: Medical Genetics

## 2024-03-23 NOTE — Telephone Encounter (Signed)
 [facility]  03/23/2024  3:42 PM  Confirmed I was speaking with Douglas [name] [phone] by using name and DOB. Informed participant the reason for this call is to follow-up on a recent buccal sample the participant provided at one of the [facility] lab locations. Informed participant the test was not able to be completed with this sample and apologized for the inconvenience. Participant was requested to provide a new sample at one of our participating labs at no cost so that participant can continue participation and receive test results. Informed participant they do not need to be fasting and if there are other samples that need to be drawn, they can be done at the same visit. Participant has not had a blood transfusion or blood product in the last 30 days. Participant agreed to provide another sample. Participant was provided the [employer] program website to learn why this may have happened. Participant was thanked for their time and continued support of the above study.    Jordyn Pennstrom, BS [place]  [facility] Clinical Research Specialist II Direct Dial: [phone]  Fax: [phone]

## 2024-03-24 ENCOUNTER — Other Ambulatory Visit: Payer: Self-pay | Admitting: Medical Genetics

## 2024-03-24 DIAGNOSIS — Z006 Encounter for examination for normal comparison and control in clinical research program: Secondary | ICD-10-CM

## 2024-03-24 NOTE — Progress Notes (Signed)
 Initial result was a TNP. New order requested. Confirmed consent on file.

## 2024-03-29 ENCOUNTER — Other Ambulatory Visit: Payer: Self-pay | Admitting: Nurse Practitioner

## 2024-03-29 DIAGNOSIS — I10 Essential (primary) hypertension: Secondary | ICD-10-CM

## 2024-03-29 MED ORDER — AMLODIPINE 5 MG TABLET
ORAL_TABLET | Freq: Every day | ORAL | 1 refills | 90.00000 days
Start: 2024-03-29 — End: ?

## 2024-03-29 MED ORDER — LOSARTAN 100 MG-HYDROCHLOROTHIAZIDE 25 MG TABLET
ORAL_TABLET | Freq: Every day | ORAL | 1 refills | 90.00000 days
Start: 2024-03-29 — End: ?

## 2024-03-31 MED FILL — LOSARTAN 100 MG-HYDROCHLOROTHIAZIDE 25 MG TABLET: ORAL | 90 days supply | Qty: 90 | Fill #0

## 2024-03-31 MED FILL — METFORMIN 500 MG TABLET: ORAL | 90 days supply | Qty: 180 | Fill #1

## 2024-04-07 NOTE — Unmapped (Signed)
 04/07/2024 - Patient originally requested delivery for 04/08/2024. Delivery is not possible on this date due to Insurance . I have reached out to the patient and confirmed that delivery on 04/09/2024 is ok.  [facility] Specialty and Home Delivery Pharmacy Refill Coordination Note    Brandon Solis, : May 26, 1981  Phone: [phone] (home)       All above HIPAA information was verified with patient.         04/06/2024    12:46 PM   Specialty Rx Medication Refill Questionnaire   Which Medications would you like refilled and shipped? Biktarvy    Please list all current allergies: Shellfish   Have you missed any doses in the last 30 days? No   Have you had any changes to your medication(s) since your last refill? No   How many days remaining of each medication do you have at home? Not sure   Have you experienced any side effects in the last 30 days? No   Please enter the full address (street address, city, state, zip code) where you would like your medication(s) to be delivered to. [address] , [place] [address]   Please specify on which day you would like your medication(s) to arrive. Note: if you need your medication(s) within 3 days, please call the pharmacy to schedule your order at [phone]  04/08/2024   Has your insurance changed since your last refill? No   Would you like a pharmacist to call you to discuss your medication(s)? No   Do you require a signature for your package? (Note: if we are billing Medicare Part B or your order contains a controlled substance, we will require a signature) No   I have been provided my out of pocket cost for my medication and approve the pharmacy to charge the amount to my credit card on file. Yes         Completed refill call assessment today to schedule patient's medication shipment from the [facility] and Home Delivery Pharmacy [phone]).  All relevant notes have been reviewed.       Confirmed patient received a Conservation officer, historic buildings and a Surveyor, mining with first shipment. The patient will receive a drug information handout for each medication shipped and additional FDA Medication Guides as required.         REFERRAL TO PHARMACIST     Referral to the pharmacist: Not needed      [facility]     Shipping address confirmed in Epic.     Delivery Scheduled: Yes, Expected medication delivery date: 04/09/2024.     Medication will be delivered via UPS to the prescription address in Epic WAM.    Brandon Solis   [name]

## 2024-04-13 MED FILL — BIKTARVY 50 MG-200 MG-25 MG TABLET: ORAL | 90 days supply | Qty: 90 | Fill #1

## 2024-04-15 ENCOUNTER — Other Ambulatory Visit: Payer: Self-pay

## 2024-04-27 MED FILL — ATORVASTATIN 10 MG TABLET: ORAL | 90 days supply | Qty: 90 | Fill #1

## 2024-06-02 ENCOUNTER — Ambulatory Visit: Payer: BC Managed Care – PPO | Admitting: Nurse Practitioner

## 2024-06-03 ENCOUNTER — Other Ambulatory Visit (HOSPITAL_COMMUNITY): Payer: Self-pay

## 2024-06-03 ENCOUNTER — Encounter: Payer: Self-pay | Admitting: Internal Medicine

## 2024-06-03 ENCOUNTER — Telehealth: Payer: Self-pay

## 2024-06-03 NOTE — Telephone Encounter (Signed)
 [facility] Pharmacy Patient Advocate Encounter  Insurance verification completed.    The patient is uninsured and will need patient assistance for medication.  We can complete the application and will need to meet with the patient for signatures and income documentation.

## 2024-06-03 NOTE — Telephone Encounter (Signed)
 He can stop by anytime for me to fill out Douglas [name] to see if can get approved.

## 2024-06-03 NOTE — Telephone Encounter (Signed)
 FYI everyone - he is currently uninsured but would like to start Cabenuva ASAP. Let me know if and when he can stop by to see you or what he needs to send you!

## 2024-06-04 ENCOUNTER — Other Ambulatory Visit: Payer: Self-pay

## 2024-06-25 ENCOUNTER — Telehealth: Payer: Self-pay

## 2024-06-25 ENCOUNTER — Other Ambulatory Visit (HOSPITAL_COMMUNITY): Payer: Self-pay

## 2024-06-25 DIAGNOSIS — B20 Human immunodeficiency virus [HIV] disease: Secondary | ICD-10-CM

## 2024-06-25 MED ORDER — CABOTEGRAVIR & RILPIVIRINE ER 600 & 900 MG/3ML IM SUER
1.0000 | INTRAMUSCULAR | 1 refills | Status: DC
  Filled 2024-06-28: qty 6, 30d supply, fill #0
  Filled 2024-07-27: qty 6, 30d supply, fill #1

## 2024-06-25 NOTE — Addendum Note (Signed)
 Addended by: WADDELL ALAN PARAS on: 06/25/2024 12:44 PM   Modules accepted: Orders

## 2024-06-25 NOTE — Telephone Encounter (Signed)
 Pharmacy Patient Advocate Encounter  Insurance verification completed.   The patient is insured through [facility]   Ran test claim for Cabenuva. Currently a quantity of 6ml is a 30 day supply and the co-pay is 0.00 .   This test claim was processed through [facility]- copay amounts may vary at other pharmacies due to pharmacy/plan contracts, or as the patient moves through the different stages of their insurance plan.

## 2024-06-25 NOTE — Telephone Encounter (Signed)
 Patient states he now has active [employer]. Can we confirm and investigate Cabenuva coverage?

## 2024-06-28 ENCOUNTER — Other Ambulatory Visit (HOSPITAL_COMMUNITY): Payer: Self-pay

## 2024-06-28 ENCOUNTER — Ambulatory Visit: Admitting: Nurse Practitioner

## 2024-06-28 ENCOUNTER — Other Ambulatory Visit: Payer: Self-pay

## 2024-06-28 NOTE — Unmapped (Signed)
 Brandon Solis has been contacted in regards to their refill of BIKTARVY  50-200-25 mg tablet (bictegrav-emtricit-tenofov ala). At this time, they have declined refill due to Pt can no longer get medication here per ins. Refill assessment call date has been updated per the patient's request.

## 2024-06-28 NOTE — Progress Notes (Signed)
 Specialty Pharmacy Initial Fill Coordination Note  Douglas [name] is a 43 y.o. male contacted today regarding initial fill of specialty medication(s) Cabotegravir  & Rilpivirine  (CABENUVA )   Patient requested Courier to Provider Office   Delivery date: 06/30/24   Verified address: [address] [place] [address]   Medication will be filled on 06/29/24.   Patient is aware of 0.00 copayment.

## 2024-06-28 NOTE — Unmapped (Signed)
 Specialty Medication(s): Biktarvy     Mr.Schall has been dis-enrolled from the [facility] Specialty and Home Delivery Pharmacy specialty pharmacy services as a result of a change in therapy. The patient is now taking Cabenuva and is not filling at the [facility] and a pharmacy change resulting from insurance limitations. The insurance company requires the patient fill at [facility] health outpatient pharmacy.    Additional information provided to the patient: he has new insurance which requires him to use [employer]. We contacted provider to send Biktarvy  to other pharmacy, but they stated he will be starting on Cabenuva on 8/13.     Aleck CHRISTELLA Gaskins, PharmD  [facility] Specialty and Home Delivery Pharmacy Specialty Pharmacist

## 2024-06-29 ENCOUNTER — Other Ambulatory Visit: Payer: Self-pay

## 2024-06-30 ENCOUNTER — Other Ambulatory Visit (HOSPITAL_COMMUNITY): Payer: Self-pay

## 2024-06-30 ENCOUNTER — Telehealth: Payer: Self-pay

## 2024-06-30 MED FILL — Losartan Potassium & Hydrochlorothiazide Tab 100-25 MG: ORAL | 90 days supply | Qty: 90 | Fill #0 | Status: AC

## 2024-06-30 MED FILL — Amlodipine Besylate Tab 5 MG (Base Equivalent): ORAL | 90 days supply | Qty: 90 | Fill #0 | Status: AC

## 2024-06-30 NOTE — Telephone Encounter (Signed)
 [facility] Patient Advocate Encounter  Patient's medications CABENUVA  have been couriered to [facility] from [facility] and will be administered at the patients appointment on 07/07/24.  Charmaine Sharps, CPhT Specialty Pharmacy Patient [facility] for Infectious Disease Phone: [phone] Fax:  [phone]

## 2024-07-06 NOTE — Progress Notes (Signed)
 HPI: Douglas [name] is a 43 y.o. male who presents to the [facility] pharmacy clinic for his initial Cabenuva  administration.  Patient Active Problem List   Diagnosis Date Noted   Encounter for annual health examination 12/17/2023   Essential hypertension 09/14/2023   Need for influenza vaccination 09/14/2023   Need for Tdap vaccination 09/14/2023   Snoring 09/03/2023   At increased risk for cardiovascular disease 05/20/2023   Vitamin D  deficiency 05/20/2023   SOB (shortness of breath) on exertion 05/06/2023   Other fatigue 05/06/2023   Depression screen 08/26/2022   Prediabetes 08/26/2022   Anxiety 01/18/2021   Alcohol abuse 09/06/2020   Tobacco abuse 09/05/2020   Screening for prostate cancer 07/28/2020   Family history of heart disease 06/22/2019   Pure hypercholesterolemia 06/22/2019   Family history of premature CAD 04/14/2019   Obesity, Class II, BMI 35-39.9 01/01/2018   Seborrheic dermatitis 08/19/2013   Human immunodeficiency virus (HIV) disease (HCC) 06/25/2013    Patient's Medications  New Prescriptions   No medications on file  Previous Medications   AMLODIPINE  (NORVASC ) 5 MG TABLET    Take 1 tablet (5 mg total) by mouth daily.   AMLODIPINE  (NORVASC ) 5 MG TABLET    Take 1 tablet (5 mg total) by mouth daily.   ATORVASTATIN  (LIPITOR) 10 MG TABLET    Take 1 tablet (10 mg total) by mouth daily.   CABOTEGRAVIR  & RILPIVIRINE  ER (CABENUVA ) 600 & 900 MG/3ML INJECTION    Inject 1 kit into the muscle every 30 (thirty) days.   LOSARTAN -HYDROCHLOROTHIAZIDE  (HYZAAR ) 100-25 MG TABLET    Take 1 tablet by mouth daily.   METFORMIN  (GLUCOPHAGE ) 500 MG TABLET    Take 1 tablet (500 mg total) by mouth 2 (two) times daily with a meal.   VITAMIN D , ERGOCALCIFEROL , (DRISDOL ) 1.25 MG (50000 UNIT) CAPS CAPSULE    Take 1 capsule (50,000 Units total) by mouth every 7 (seven) days.  Modified Medications   No medications on file  Discontinued Medications   BICTEGRAVIR-EMTRICITABINE -TENOFOVIR  AF  (BIKTARVY ) 50-200-25 MG TABS TABLET    TAKE 1 TABLET BY MOUTH 1 TIME A DAY.    Allergies: Allergies  Allergen Reactions   Shellfish Allergy Anaphylaxis    Labs: Lab Results  Component Value Date   HIV1RNAQUANT NOT DETECTED 02/09/2024   HIV1RNAQUANT Not Detected 07/21/2023   HIV1RNAQUANT Not Detected 06/20/2022   CD4TABS 746 07/21/2023   CD4TABS 505 06/20/2022   CD4TABS 578 06/04/2021    RPR and STI Lab Results  Component Value Date   LABRPR NON-REACTIVE 02/09/2024   LABRPR NON-REACTIVE 07/21/2023   LABRPR NON-REACTIVE 06/04/2021   LABRPR Non Reactive 07/28/2020   LABRPR NON-REACTIVE 12/16/2019   RPRTITER 1:1 06/17/2013    STI Results GC CT  07/21/2023  4:20 PM Negative  Negative   06/20/2022  3:58 PM Negative  Negative   06/04/2021 11:15 AM Negative  Negative   01/01/2021 11:40 AM Negative  Negative   07/28/2020  1:13 PM  Negative   09/13/2015 12:00 AM Negative  Negative   09/06/2014 12:00 AM NG: Negative  CT: Negative     Hepatitis B Lab Results  Component Value Date   HEPBSAB POS (A) 06/17/2013   HEPBSAG NEGATIVE 06/17/2013   HEPBCAB POS (A) 06/17/2013   Hepatitis C Lab Results  Component Value Date   HEPCAB NON-REACTIVE 07/21/2023   Hepatitis A Lab Results  Component Value Date   HAV POS (A) 06/17/2013   Lipids: Lab Results  Component Value Date  CHOL 127 12/04/2023   TRIG 90 12/04/2023   HDL 49 12/04/2023   CHOLHDL 2.6 12/04/2023   VLDL 14 01/02/2017   LDLCALC 61 12/04/2023    Current HIV Regimen: Biktarvy PO daily  TARGET DATE: 13  Assessment: Douglas [name] presents today for his first initiation injection for Cabenuva. Counseled that [name] is two separate intramuscular injections in the gluteal muscle on each side for each visit. Explained that the second injection is 30 days after the initial injection then every 2 months thereafter. Discussed the rare but significant chance of developing resistance despite compliance. Explained that  showing up to injection appointments is very important and warned that if 2 appointments are missed, it will be reassessed by their provider whether they are a good candidate for injection therapy. Counseled on possible side effects associated with the injections such as injection site pain, which is usually mild to moderate in nature, injection site nodules, and injection site reactions. Asked to call the clinic or send me a mychart message if they experience any issues, such as fatigue, nausea, headache, rash, or dizziness. Advised that they can take ibuprofen or tylenol for injection site pain if needed.   Administered cabotegravir 600mg /55mL in left upper outer quadrant of the gluteal muscle. Administered rilpivirine 900 mg/3mL in the right upper outer quadrant of the gluteal muscle. Monitored patient for 10 minutes after injection. Injections were tolerated well without issue. Counseled to stop taking Biktarvy after today's dose and to call with any issues that may arise. Will make follow up appointments for second initiation injection in 30 days and then maintenance injections every 2 months thereafter. He defers STI testing today.   Plan: - Stop Biktarvy after today's dose - First Cabenuva injections administered - Second set of initiation injections scheduled for 08/05/24 - Maintenance injections scheduled for 10/04/24 - Call with any issues or questions   Elma Fail, PharmD PGY1 Clinical Pharmacist [facility]  07/07/2024 10:47 AM

## 2024-07-07 ENCOUNTER — Other Ambulatory Visit

## 2024-07-07 ENCOUNTER — Other Ambulatory Visit: Payer: Self-pay

## 2024-07-07 ENCOUNTER — Ambulatory Visit: Payer: Self-pay | Admitting: Pharmacist

## 2024-07-07 DIAGNOSIS — B20 Human immunodeficiency virus [HIV] disease: Secondary | ICD-10-CM | POA: Diagnosis not present

## 2024-07-07 DIAGNOSIS — Z113 Encounter for screening for infections with a predominantly sexual mode of transmission: Secondary | ICD-10-CM

## 2024-07-07 DIAGNOSIS — Z006 Encounter for examination for normal comparison and control in clinical research program: Secondary | ICD-10-CM

## 2024-07-07 MED ORDER — CABOTEGRAVIR & RILPIVIRINE ER 600 & 900 MG/3ML IM SUER
1.0000 | Freq: Once | INTRAMUSCULAR | Status: AC
  Administered 2024-07-07 (×2): 1 via INTRAMUSCULAR

## 2024-07-12 ENCOUNTER — Other Ambulatory Visit (HOSPITAL_COMMUNITY): Payer: Self-pay

## 2024-07-12 ENCOUNTER — Other Ambulatory Visit: Payer: Self-pay | Admitting: Nurse Practitioner

## 2024-07-12 DIAGNOSIS — R7303 Prediabetes: Secondary | ICD-10-CM

## 2024-07-12 MED ORDER — METFORMIN HCL 500 MG PO TABS
500.0000 mg | ORAL_TABLET | Freq: Two times a day (BID) | ORAL | 1 refills | Status: AC
  Filled 2024-07-12 – 2024-11-19 (×2): qty 180, 90d supply, fill #0
  Filled 2024-11-19: qty 180, 90d supply, fill #1

## 2024-07-20 ENCOUNTER — Encounter: Payer: Self-pay | Admitting: Nurse Practitioner

## 2024-07-23 ENCOUNTER — Encounter: Payer: Self-pay | Admitting: Internal Medicine

## 2024-07-26 LAB — GENECONNECT MOLECULAR SCREEN: Genetic Analysis Overall Interpretation: NEGATIVE

## 2024-07-27 ENCOUNTER — Other Ambulatory Visit: Payer: Self-pay

## 2024-07-27 ENCOUNTER — Other Ambulatory Visit (HOSPITAL_COMMUNITY): Payer: Self-pay

## 2024-07-27 NOTE — Progress Notes (Signed)
 Specialty Pharmacy Refill Coordination Note  Douglas [name] is a 43 y.o. male assessed today regarding refills of clinic administered specialty medication(s) Cabotegravir  & Rilpivirine  (CABENUVA )   Clinic requested Courier to Provider Office   Delivery date: 08/02/24   Verified address: [address] [place] [address]   Medication will be filled on 07/30/24.

## 2024-07-30 ENCOUNTER — Other Ambulatory Visit: Payer: Self-pay

## 2024-08-02 ENCOUNTER — Telehealth: Payer: Self-pay

## 2024-08-02 NOTE — Telephone Encounter (Signed)
 [facility] Patient Advocate Encounter  Patient's medications CABENUVA  have been couriered to [facility] from [facility] and will be administered at the patients appointment on 08/05/24.  Charmaine Sharps, CPhT Specialty Pharmacy Patient [facility] Phone: [phone] Fax:  [phone]

## 2024-08-04 NOTE — Progress Notes (Unsigned)
 HPI: Douglas [name] is a 43 y.o. male who presents to the [facility] pharmacy clinic for Cabenuva  administration.  Patient Active Problem List   Diagnosis Date Noted   Encounter for annual health examination 12/17/2023   Essential hypertension 09/14/2023   Need for influenza vaccination 09/14/2023   Need for Tdap vaccination 09/14/2023   Snoring 09/03/2023   At increased risk for cardiovascular disease 05/20/2023   Vitamin D  deficiency 05/20/2023   SOB (shortness of breath) on exertion 05/06/2023   Other fatigue 05/06/2023   Depression screen 08/26/2022   Prediabetes 08/26/2022   Anxiety 01/18/2021   Alcohol abuse 09/06/2020   Tobacco abuse 09/05/2020   Screening for prostate cancer 07/28/2020   Family history of heart disease 06/22/2019   Pure hypercholesterolemia 06/22/2019   Family history of premature CAD 04/14/2019   Obesity, Class II, BMI 35-39.9 01/01/2018   Seborrheic dermatitis 08/19/2013   Human immunodeficiency virus (HIV) disease (HCC) 06/25/2013    Patient's Medications  New Prescriptions   No medications on file  Previous Medications   AMLODIPINE  (NORVASC ) 5 MG TABLET    Take 1 tablet (5 mg total) by mouth daily.   AMLODIPINE  (NORVASC ) 5 MG TABLET    Take 1 tablet (5 mg total) by mouth daily.   ATORVASTATIN  (LIPITOR) 10 MG TABLET    Take 1 tablet (10 mg total) by mouth daily.   CABOTEGRAVIR  & RILPIVIRINE  ER (CABENUVA ) 600 & 900 MG/3ML INJECTION    Inject 1 kit into the muscle every 30 (thirty) days.   LOSARTAN -HYDROCHLOROTHIAZIDE  (HYZAAR ) 100-25 MG TABLET    Take 1 tablet by mouth daily.   METFORMIN  (GLUCOPHAGE ) 500 MG TABLET    Take 1 tablet (500 mg total) by mouth 2 (two) times daily with a meal.   VITAMIN D , ERGOCALCIFEROL , (DRISDOL ) 1.25 MG (50000 UNIT) CAPS CAPSULE    Take 1 capsule (50,000 Units total) by mouth every 7 (seven) days.  Modified Medications   No medications on file  Discontinued Medications   No medications on file    Allergies: Allergies   Allergen Reactions   Shellfish Allergy Anaphylaxis    Labs: Lab Results  Component Value Date   HIV1RNAQUANT NOT DETECTED 02/09/2024   HIV1RNAQUANT Not Detected 07/21/2023   HIV1RNAQUANT Not Detected 06/20/2022   CD4TABS 746 07/21/2023   CD4TABS 505 06/20/2022   CD4TABS 578 06/04/2021    RPR and STI Lab Results  Component Value Date   LABRPR NON-REACTIVE 02/09/2024   LABRPR NON-REACTIVE 07/21/2023   LABRPR NON-REACTIVE 06/04/2021   LABRPR Non Reactive 07/28/2020   LABRPR NON-REACTIVE 12/16/2019   RPRTITER 1:1 06/17/2013    STI Results GC CT  07/21/2023  4:20 PM Negative  Negative   06/20/2022  3:58 PM Negative  Negative   06/04/2021 11:15 AM Negative  Negative   01/01/2021 11:40 AM Negative  Negative   07/28/2020  1:13 PM  Negative   09/13/2015 12:00 AM Negative  Negative   09/06/2014 12:00 AM NG: Negative  CT: Negative     Hepatitis B Lab Results  Component Value Date   HEPBSAB POS (A) 06/17/2013   HEPBSAG NEGATIVE 06/17/2013   HEPBCAB POS (A) 06/17/2013   Hepatitis C Lab Results  Component Value Date   HEPCAB NON-REACTIVE 07/21/2023   Hepatitis A Lab Results  Component Value Date   HAV POS (A) 06/17/2013   Lipids: Lab Results  Component Value Date   CHOL 127 12/04/2023   TRIG 90 12/04/2023   HDL 49 12/04/2023  CHOLHDL 2.6 12/04/2023   VLDL 14 01/02/2017   LDLCALC 61 12/04/2023    TARGET DATE: 13th  Assessment: Douglas [name] presents today for his second initiation Cabenuva  injection. Past injection was tolerated well. He did report soreness, but it went away quickly. Discussed options for reducing the soreness including OTC pain relievers and ice packs. Last HIV RNA was undetectable in March. Doing well with no issues today. He requests STI testing today.  Administered cabotegravir  600mg /11mL in left upper outer quadrant of the gluteal muscle. Administered rilpivirine  900 mg/3mL in the right upper outer quadrant of the gluteal muscle. No issues with  injections. He will follow up in 2 months for next set of injections.  Discussed eligibility for the 2025-2026 flu, meningococcal booster, Shingrix  and HPV vaccines. He accepts all today. Will administer the next Shingrix  dose in the November-March window. Will administer the next HPV vaccine in November and the final dose in March 2026.  Plan: - Cabenuva  injections administered - HIV RNA collected - Oral, rectal, urine cytologies for GC/chlamydia and RPR collected today - Flu, meningococcal booster, Shingrix  and HPV vaccines administered - Next injections scheduled for 10/04/24 with Dr Dennise and 11/30/24 with Alan - Call with any issues or questions  Izetta Carl, PharmD PGY1 Pharmacy Resident [facility]  08/04/2024 9:54 PM

## 2024-08-05 ENCOUNTER — Encounter: Payer: Self-pay | Admitting: Neurology

## 2024-08-05 ENCOUNTER — Other Ambulatory Visit (HOSPITAL_COMMUNITY)
Admission: RE | Admit: 2024-08-05 | Discharge: 2024-08-05 | Disposition: A | Discharge status: Home / Self Care | Source: Ambulatory Visit | Attending: Internal Medicine | Admitting: Internal Medicine

## 2024-08-05 ENCOUNTER — Other Ambulatory Visit: Payer: Self-pay

## 2024-08-05 ENCOUNTER — Ambulatory Visit: Payer: Self-pay | Admitting: Pharmacist

## 2024-08-05 DIAGNOSIS — B20 Human immunodeficiency virus [HIV] disease: Secondary | ICD-10-CM | POA: Diagnosis not present

## 2024-08-05 DIAGNOSIS — Z113 Encounter for screening for infections with a predominantly sexual mode of transmission: Secondary | ICD-10-CM

## 2024-08-05 DIAGNOSIS — Z23 Encounter for immunization: Secondary | ICD-10-CM

## 2024-08-05 MED ORDER — CABOTEGRAVIR & RILPIVIRINE ER 600 & 900 MG/3ML IM SUER
1.0000 | Freq: Once | INTRAMUSCULAR | Status: AC
  Administered 2024-08-05: 1 via INTRAMUSCULAR

## 2024-08-06 LAB — CYTOLOGY, (ORAL, ANAL, URETHRAL) ANCILLARY ONLY
Chlamydia: NEGATIVE
Chlamydia: NEGATIVE
Comment: NEGATIVE
Comment: NEGATIVE
Comment: NORMAL
Comment: NORMAL
Neisseria Gonorrhea: NEGATIVE
Neisseria Gonorrhea: NEGATIVE

## 2024-08-06 LAB — URINE CYTOLOGY ANCILLARY ONLY
Chlamydia: NEGATIVE
Comment: NEGATIVE
Comment: NORMAL
Neisseria Gonorrhea: NEGATIVE

## 2024-08-09 ENCOUNTER — Encounter: Payer: Self-pay | Admitting: Nurse Practitioner

## 2024-08-09 ENCOUNTER — Ambulatory Visit: Admitting: Nurse Practitioner

## 2024-08-09 ENCOUNTER — Other Ambulatory Visit (HOSPITAL_COMMUNITY): Payer: Self-pay

## 2024-08-09 VITALS — BP 130/74 | HR 69 | Temp 98.4°F | Ht 72.0 in | Wt 270.0 lb

## 2024-08-09 DIAGNOSIS — R748 Abnormal levels of other serum enzymes: Secondary | ICD-10-CM

## 2024-08-09 DIAGNOSIS — Z6836 Body mass index (BMI) 36.0-36.9, adult: Secondary | ICD-10-CM | POA: Diagnosis not present

## 2024-08-09 DIAGNOSIS — E559 Vitamin D deficiency, unspecified: Secondary | ICD-10-CM | POA: Diagnosis not present

## 2024-08-09 DIAGNOSIS — R7303 Prediabetes: Secondary | ICD-10-CM

## 2024-08-09 DIAGNOSIS — E7800 Pure hypercholesterolemia, unspecified: Secondary | ICD-10-CM

## 2024-08-09 DIAGNOSIS — E66812 Obesity, class 2: Secondary | ICD-10-CM

## 2024-08-09 DIAGNOSIS — I10 Essential (primary) hypertension: Secondary | ICD-10-CM

## 2024-08-09 DIAGNOSIS — Z139 Encounter for screening, unspecified: Secondary | ICD-10-CM | POA: Diagnosis not present

## 2024-08-09 MED ORDER — AMLODIPINE BESYLATE 5 MG PO TABS
5.0000 mg | ORAL_TABLET | Freq: Every day | ORAL | 1 refills | Status: AC
  Filled 2024-08-09 – 2024-11-19 (×3): qty 90, 90d supply, fill #0

## 2024-08-09 MED ORDER — ATORVASTATIN CALCIUM 10 MG PO TABS
10.0000 mg | ORAL_TABLET | Freq: Every day | ORAL | 1 refills | Status: AC
  Filled 2024-08-09 – 2024-11-19 (×3): qty 90, 90d supply, fill #0
  Filled 2024-11-19: qty 90, 90d supply, fill #1

## 2024-08-09 MED ORDER — LOSARTAN POTASSIUM-HCTZ 100-25 MG PO TABS
1.0000 | ORAL_TABLET | Freq: Every day | ORAL | 1 refills | Status: AC
  Filled 2024-08-09: qty 90, 90d supply, fill #0
  Filled 2024-11-19: qty 30, 30d supply, fill #0
  Filled 2024-11-19: qty 60, 60d supply, fill #0
  Filled 2024-11-19: qty 90, 90d supply, fill #0

## 2024-08-09 MED ORDER — VITAMIN D (ERGOCALCIFEROL) 1.25 MG (50000 UNIT) PO CAPS
50000.0000 [IU] | ORAL_CAPSULE | ORAL | 0 refills | Status: AC
  Filled 2024-08-09 – 2024-08-23 (×2): qty 12, 84d supply, fill #0
  Filled 2024-11-19: qty 4, 28d supply, fill #0
  Filled 2024-11-19: qty 12, 84d supply, fill #1

## 2024-08-09 NOTE — Progress Notes (Signed)
 LILLETTE Kristeen JINNY Gladis, CMA,acting as a Neurosurgeon for Gaines Ada, [name].,have documented all relevant documentation on the behalf of Gaines Ada, [name],as directed by  Gaines Ada, [name] while in the presence of Gaines Ada, [name].  Subjective:  Patient ID: Douglas [name] , male    DOB: January 07, 1981 , 43 y.o.   MRN: [id number]  Chief Complaint  Patient presents with   Hypertension    Patient presents today for a bp and CHOL follow up, Patient reports compliance with medication. Patient denies any chest pain, SOB, or headaches. Patient has no concerns today.      HPI  Discussed the use of AI scribe software for clinical note transcription with the patient, who gave verbal consent to proceed.  History of Present Illness Douglas [name] is a 43 year old male with hypertension and prediabetes who presents for a follow-up visit.  He has been experiencing variable blood pressure readings, with higher readings in the morning, such as 163/94 mmHg. He lives alone and is unsure if he snores, but he has an upcoming sleep study scheduled for August 18, 2024.  He has not been exercising as much due to working 12-hour shifts and no longer working from home. He works at an urgent care center and finds it challenging to get his steps in, averaging about 4,000 to 5,000 steps a day. He is trying to increase his physical activity and has been wearing his watch more to track his steps. 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  He is currently taking Lipitor (atorvastatin ), two blood pressure medications, and vitamin D . He reports infrequent smoking and has reduced his alcohol intake, which he believes has contributed to his recent weight loss of 10 pounds. His last A1c was 6.2.   Past Medical History:  Diagnosis Date   Alcohol abuse    Allergy    Shell fish   Anxiety    Back pain    Chest pain     Constipation    Family history of heart disease in male family member before age 19    Food allergy    High cholesterol    HIV infection (HCC) 2014   Hypertension 05/2013   Lactose intolerance    Low HDL (under 40)    Prediabetes    Seasonal allergic rhinitis    weather changes, spring/fall   Sickle cell trait (HCC)    Sleep apnea      Family History  Problem Relation Age of Onset   Diabetes Mother    Hyperlipidemia Mother    Hypertension Mother    Stroke Mother    Kidney disease Mother    Depression Mother    Drug abuse Mother    Obesity Mother    Vision loss Mother    Heart disease Father 66       died of MI   Diabetes Father    Sudden death Father    Alcoholism Father    Obesity Father    Early death Father    Hypertension Sister    Diabetes Sister        type 2   Early death Sister    Hypertension Maternal Grandmother    Stroke Maternal Grandmother    Vision loss Maternal Grandmother    Hypertension Maternal Grandfather    Cancer Paternal Grandmother        lung  Heart disease Paternal Uncle        several uncles died in 72s with MI   Hypertension Paternal Uncle    Heart disease Paternal Uncle        MI in 58s     Current Outpatient Medications:    cabotegravir  & rilpivirine  ER (CABENUVA ) 600 & 900 MG/3ML injection, Inject 1 kit into the muscle every 30 (thirty) days., Disp: 6 mL, Rfl: 1   metFORMIN  (GLUCOPHAGE ) 500 MG tablet, Take 1 tablet (500 mg total) by mouth 2 (two) times daily with a meal., Disp: 180 tablet, Rfl: 1   amLODipine  (NORVASC ) 5 MG tablet, Take 1 tablet (5 mg total) by mouth daily., Disp: 90 tablet, Rfl: 1   atorvastatin  (LIPITOR) 10 MG tablet, Take 1 tablet (10 mg total) by mouth daily., Disp: 90 tablet, Rfl: 1   losartan -hydrochlorothiazide  (HYZAAR ) 100-25 MG tablet, Take 1 tablet by mouth daily., Disp: 90 tablet, Rfl: 1   Vitamin D , Ergocalciferol , (DRISDOL ) 1.25 MG (50000 UNIT) CAPS capsule, Take 1 capsule (50,000 Units total) by  mouth every 7 (seven) days., Disp: 16 capsule, Rfl: 0   Allergies  Allergen Reactions   Shellfish Allergy Anaphylaxis     Review of Systems  Constitutional: Negative.   HENT: Negative.    Eyes: Negative.   Respiratory: Negative.    Cardiovascular: Negative.   Gastrointestinal: Negative.   Neurological: Negative.   Psychiatric/Behavioral: Negative.       Today's Vitals   08/09/24 1139  BP: 130/74  Pulse: 69  Temp: 98.4 F (36.9 C)  TempSrc: Oral  Weight: 270 lb (122.5 kg)  Height: 6' (1.829 m)  PainSc: 0-No pain   Body mass index is 36.62 kg/m.  Wt Readings from Last 3 Encounters:  08/09/24 270 lb (122.5 kg)  03/02/24 280 lb (127 kg)  12/04/23 278 lb 3.2 oz (126.2 kg)     Objective:  Physical Exam Vitals and nursing note reviewed.  Constitutional:      General: He is not in acute distress.    Appearance: Normal appearance. He is obese. He is not ill-appearing.  Cardiovascular:     Rate and Rhythm: Normal rate and regular rhythm.     Pulses: Normal pulses.     Heart sounds: Normal heart sounds. No murmur heard. Pulmonary:     Effort: Pulmonary effort is normal. No respiratory distress.     Breath sounds: Normal breath sounds. No wheezing.  Skin:    General: Skin is warm and dry.  Neurological:     General: No focal deficit present.     Mental Status: He is alert and oriented to person, place, and time.     Cranial Nerves: No cranial nerve deficit.     Motor: No weakness.  Psychiatric:        Mood and Affect: Mood normal.        Behavior: Behavior normal.        Thought Content: Thought content normal.        Judgment: Judgment normal.      Assessment And Plan:  Essential hypertension Assessment & Plan: Blood pressure controlled at 130/74 mmHg, but elevated morning readings at home suggest possible sleep-related issues. - Continue Losartan -hydrochlorothiazide  and Amlodipine . - Proceed with sleep study on September 24.  Orders: -     BMP8+eGFR -      amLODIPine  Besylate; Take 1 tablet (5 mg total) by mouth daily.  Dispense: 90 tablet; Refill: 1 -     Losartan  Potassium-HCTZ; Take  1 tablet by mouth daily.  Dispense: 90 tablet; Refill: 1  Pure hypercholesterolemia Assessment & Plan: Cholesterol management with atorvastatin  ongoing. - Continue atorvastatin  10 mg daily.  Orders: -     Lipid panel -     Atorvastatin  Calcium ; Take 1 tablet (10 mg total) by mouth daily.  Dispense: 90 tablet; Refill: 1  Vitamin D  deficiency Assessment & Plan: Will check vitamin D  level and supplement as needed.    Also encouraged to spend 15 minutes in the sun daily.    Orders: -     Vitamin D  (Ergocalciferol ); Take 1 capsule (50,000 Units total) by mouth every 7 (seven) days.  Dispense: 16 capsule; Refill: 0  Class 2 obesity with body mass index (BMI) of 36.0 to 36.9 in adult, unspecified obesity type, unspecified whether serious comorbidity present Assessment & Plan: Weight loss of 10 pounds shows progress. Increased physical activity needed for further weight loss. - Encourage 10,000 steps per day. - Monitor weight and encourage further weight loss.   Encounter for screening -     Hepatitis B surface antibody,qualitative  Prediabetes Assessment & Plan: A1c at 6.2% indicates prediabetes. Weight loss of 10 pounds may improve glycemic control. - Monitor A1c to keep below 6.4%. - Encourage continued weight loss and lifestyle modifications.   Elevated liver enzymes Assessment & Plan: Previous ALT slightly elevated at 49 U/L. Reduced alcohol intake may improve liver function. - Monitor liver function tests for ALT decrease. - Encourage reduced alcohol intake.       Return for KEEP SAME NEXT.  Patient was given opportunity to ask questions. Patient verbalized understanding of the plan and was able to repeat key elements of the plan. All questions were answered to their satisfaction.    LILLETTE Gaines Ada, [name], have reviewed all  documentation for this visit. The documentation on 08/09/24 for the exam, diagnosis, procedures, and orders are all accurate and complete.   IF YOU HAVE BEEN REFERRED TO A SPECIALIST, IT MAY TAKE 1-2 WEEKS TO SCHEDULE/PROCESS THE REFERRAL. IF YOU HAVE NOT HEARD FROM US /SPECIALIST IN TWO WEEKS, PLEASE GIVE US  A CALL AT [phone].

## 2024-08-10 ENCOUNTER — Ambulatory Visit: Payer: Self-pay | Admitting: Nurse Practitioner

## 2024-08-10 LAB — RPR: RPR Ser Ql: NONREACTIVE

## 2024-08-10 LAB — BMP8+EGFR
BUN/Creatinine Ratio: 11 (ref 9–20)
BUN: 11 mg/dL (ref 6–24)
CO2: 25 mmol/L (ref 20–29)
Calcium: 9.8 mg/dL (ref 8.7–10.2)
Chloride: 99 mmol/L (ref 96–106)
Creatinine, Ser: 1 mg/dL (ref 0.76–1.27)
Glucose: 86 mg/dL (ref 70–99)
Potassium: 3.8 mmol/L (ref 3.5–5.2)
Sodium: 139 mmol/L (ref 134–144)
eGFR: 96 mL/min/1.73 (ref >59)

## 2024-08-10 LAB — LIPID PANEL
Chol/HDL Ratio: 2.6 ratio (ref 0.0–5.0)
Cholesterol, Total: 111 mg/dL (ref 100–199)
HDL: 43 mg/dL (ref >39)
LDL Chol Calc (NIH): 53 mg/dL (ref 0–99)
Triglycerides: 72 mg/dL (ref 0–149)
VLDL Cholesterol Cal: 15 mg/dL (ref 5–40)

## 2024-08-10 LAB — HIV-1 RNA QUANT-NO REFLEX-BLD
HIV 1 RNA Quant: NOT DETECTED {copies}/mL
HIV-1 RNA Quant, Log: NOT DETECTED {Log_copies}/mL

## 2024-08-10 LAB — HEPATITIS B SURFACE ANTIBODY,QUALITATIVE: Hep B Surface Ab, Qual: REACTIVE

## 2024-08-15 DIAGNOSIS — Z6836 Body mass index (BMI) 36.0-36.9, adult: Secondary | ICD-10-CM | POA: Insufficient documentation

## 2024-08-15 DIAGNOSIS — R748 Abnormal levels of other serum enzymes: Secondary | ICD-10-CM | POA: Insufficient documentation

## 2024-08-15 NOTE — Assessment & Plan Note (Signed)
 A1c at 6.2% indicates prediabetes. Weight loss of 10 pounds may improve glycemic control. - Monitor A1c to keep below 6.4%. - Encourage continued weight loss and lifestyle modifications.

## 2024-08-15 NOTE — Assessment & Plan Note (Signed)
 Blood pressure controlled at 130/74 mmHg, but elevated morning readings at home suggest possible sleep-related issues. - Continue Losartan -hydrochlorothiazide  and Amlodipine . - Proceed with sleep study on September 24.

## 2024-08-15 NOTE — Assessment & Plan Note (Signed)
 Cholesterol management with atorvastatin  ongoing. - Continue atorvastatin  10 mg daily.

## 2024-08-15 NOTE — Assessment & Plan Note (Signed)
 Weight loss of 10 pounds shows progress. Increased physical activity needed for further weight loss. - Encourage 10,000 steps per day. - Monitor weight and encourage further weight loss.

## 2024-08-15 NOTE — Assessment & Plan Note (Signed)
 Will check vitamin D  level and supplement as needed.    Also encouraged to spend 15 minutes in the sun daily.

## 2024-08-15 NOTE — Assessment & Plan Note (Signed)
 Previous ALT slightly elevated at 49 U/L. Reduced alcohol intake may improve liver function. - Monitor liver function tests for ALT decrease. - Encourage reduced alcohol intake.

## 2024-08-17 ENCOUNTER — Encounter: Payer: Self-pay | Admitting: Neurology

## 2024-08-17 ENCOUNTER — Encounter: Payer: Self-pay | Admitting: Internal Medicine

## 2024-08-18 ENCOUNTER — Institutional Professional Consult (permissible substitution): Admitting: Neurology

## 2024-08-18 ENCOUNTER — Other Ambulatory Visit (HOSPITAL_COMMUNITY): Payer: Self-pay

## 2024-08-23 ENCOUNTER — Encounter (HOSPITAL_COMMUNITY): Payer: Self-pay | Admitting: Pharmacist

## 2024-08-23 ENCOUNTER — Other Ambulatory Visit (HOSPITAL_COMMUNITY): Payer: Self-pay

## 2024-08-23 ENCOUNTER — Other Ambulatory Visit: Payer: Self-pay

## 2024-09-01 ENCOUNTER — Ambulatory Visit: Admitting: Internal Medicine

## 2024-09-20 ENCOUNTER — Ambulatory Visit: Admitting: Neurology

## 2024-09-20 ENCOUNTER — Encounter: Payer: Self-pay | Admitting: Neurology

## 2024-09-20 VITALS — BP 134/86 | HR 65 | Ht 72.0 in | Wt 274.2 lb

## 2024-09-20 DIAGNOSIS — R0683 Snoring: Secondary | ICD-10-CM | POA: Diagnosis not present

## 2024-09-20 DIAGNOSIS — E66812 Obesity, class 2: Secondary | ICD-10-CM

## 2024-09-20 DIAGNOSIS — R7303 Prediabetes: Secondary | ICD-10-CM

## 2024-09-20 DIAGNOSIS — B20 Human immunodeficiency virus [HIV] disease: Secondary | ICD-10-CM | POA: Diagnosis not present

## 2024-09-20 DIAGNOSIS — I10 Essential (primary) hypertension: Secondary | ICD-10-CM

## 2024-09-20 NOTE — Patient Instructions (Signed)
 Living With Sleep Apnea Sleep apnea is a condition that affects your breathing while you're sleeping. Your tongue or the tissue in your throat may block the flow of air while you sleep. You may have shallow breathing or stop breathing for short periods of time. The breaks in breathing interrupt the deep sleep that you need to feel rested. Even if you don't wake up from the gaps in breathing, you may feel tired during the day. People with sleep apnea may snore loudly. You may have a headache in the morning and feel anxious or depressed. How can sleep apnea affect me? Sleep apnea increases your chances of being very tired during the day. This is called daytime fatigue. Sleep apnea can also increase your risk of: Heart attack. Stroke. Obesity. Type 2 diabetes. Heart failure. Irregular heartbeat. High blood pressure. If you are very tired during the day, you may be more likely to: Not do well in school or at work. Fall asleep while driving. Have trouble paying attention. Develop depression or anxiety. Have problems having sex. This is called sexual dysfunction. What actions can I take to manage sleep apnea? Sleep apnea treatment  If you were given a device to open your airway while you sleep, use it only as told by your health care provider. You may be given: An oral appliance. This is a mouthpiece that shifts your lower jaw forward. A continuous positive airway pressure (CPAP) device. This blows air through a mask. A nasal expiratory positive airway pressure (EPAP) device. This has valves that you put into each nostril. A bi-level positive airway pressure (BIPAP) device. This blows air through a mask when you breathe in and breathe out. You may need surgery if other treatments don't work for you. Sleep habits Go to sleep and wake up at the same time every day. This helps set your internal clock for sleeping. If you stay up later than usual on weekends, try to get up in the morning within 2  hours of the time you usually wake up. Try to get at least 7-9 hours of sleep each night. Stop using a computer, tablet, and mobile phone a few hours before bedtime. Do not take long naps during the day. If you nap, limit it to 30 minutes. Have a relaxing bedtime routine. Reading or listening to music may relax you and help you sleep. Use your bedroom only for sleep. Keep your television and computer out of your bedroom. Keep your bedroom cool, dark, and quiet. Use a supportive mattress and pillows. Follow your provider's instructions for other changes to sleep habits. Nutrition Do not eat big meals in the evening. Do not have caffeine in the later part of the day. The effects of caffeine can last for more than 5 hours. Follow your provider's instructions for any changes to what you eat and drink. Lifestyle Do not drink alcohol before bedtime. Alcohol can cause you to fall asleep at first, but then it can cause you to wake up in the middle of the night and have trouble getting back to sleep. Do not smoke, vape, or use nicotine or tobacco. Medicines Take over-the-counter and prescription medicines only as told by your provider. Do not use over-the-counter sleep medicine. You may become dependent on this medicine, and it can make sleep apnea worse. Do not take medicines, such as sedatives and narcotics, unless told to by your provider. Activity Exercise on most days, but avoid exercising in the evening. Exercising near bedtime can interfere with sleeping.  If possible, spend time outside every day. Natural light helps with your internal clock. General information Lose weight if you need to. Stay at a healthy weight. If you are having surgery, make sure to tell your provider that you have sleep apnea. You may need to bring your device with you. Keep all follow-up visits. Your provider will want to check on your condition. Where to find more information National Heart, Lung, and Blood  Institute: [employer] This information is not intended to replace advice given to you by your health care provider. Make sure you discuss any questions you have with your health care provider. Document Revised: 03/05/2023 Document Reviewed: 03/05/2023 Elsevier Patient Education  2024 [employer].

## 2024-09-20 NOTE — Progress Notes (Signed)
 Provider:  Dedra Gores, MD  Primary Care Physician:  Georgina Speaks, [name] [address] [place] [address]     Referring Provider: Georgina Speaks, [name] [address] [place],  [place] [address]          Chief Complaint according to patient   Patient presents with:                HISTORY OF PRESENT ILLNESS:  Douglas [name] is a 43 y.o. male patient who is here for revisit 09/20/2024 for  obtaining a sleep test;.    Chief concern according to patient :   Discussed the use of AI scribe software for clinical note transcription with the patient, who gave verbal consent to proceed.  History of Present Illness   Douglas [name] is a 43 year old male who presents for a home sleep test.  He was initially referred for a home sleep test in 2024, but a change in health insurance shortly after the initial appointment prevented him from proceeding with the ordered tests at that time.  In August 2025, he contacted the office to inquire about the possibility of obtaining the home sleep test.       Chief concern according to patient :   I had apnea as a child age 69-6 , never had a tonsillectomy. It went away- I was a sleep walker, and I hold my breath now sometimes, as I gained weight  I am snoring  I some times wake up from snoring.      Sleep relevant medical history: Sleep walking in childhood,  no Tonsillectomy, no thyroid  disease , no sinus disease, no TBI.  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     Family medical /sleep history: No other family member with OSA. father was a loud snorer.    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   collections,  and lives in a household  alone. No pets,  Family status is single , without children.  The patient currently works from home  in daytime    Tobacco use- mild intermittent .  ETOH use ; weekends, 4-6,  3 cups of coffee daily.  Caffeine  intake in form of Coffee( 3 in AM ) Soda( /) Tea ( /) No energy drinks Exercise in form of walking daily .  Gym-      Sleep habits are as follows: The patient's dinner time is between 5-6  PM. The patient goes to bed at 10-12  PM and continues to sleep for intervals of 2 hours  - wakes for 1 bathroom break, the first time at 6 AM.   Gets up to drink water, is not aware of apnea- and he wakes up feeling not tired- , The preferred sleep position is prone with the support of 1 pillow,   Dreams are reportedly frequent/vivid.  The patient wakes up spontaneously/ at 7 and  has an alarm set at 7.30-  AM this  is the usual rise time.  He reports  feeling refreshed or restored in AM, without  symptoms such as dry mouth, morning headaches, and residual fatigue.  Naps are  not taken.     Review of Systems: Out of a complete 14 system review, the patient complains of only the following symptoms, and all other reviewed systems are negative.:   SLEEPINESS ?  How likely  are you to doze in the following situations: 0 = not likely, 1 = slight chance, 2 = moderate chance, 3 = high chance  Sitting and Reading? Watching Television? Sitting inactive in a public place (theater or meeting)? Lying down in the afternoon when circumstances permit? Sitting and talking to someone? Sitting quietly after lunch without alcohol? In a car, while stopped for a few minutes in traffic? As a passenger in a car for an hour without a break?  Total = 3/ 24  FSS at 15/ 63  No mood issues.      Social History   Socioeconomic History   Marital status: Significant Other    Spouse name: Not on file   Number of children: Not on file   Years of education: Not on file   Highest education level: Some college, no degree  Occupational History   Occupation: patient Pensions Consultant: [employer]   Occupation: Hospital Doctor  Tobacco Use   Smoking status: Some Days    Current packs/day: 0.00     Average packs/day: 0.5 packs/day for 10.0 years (5.0 ttl pk-yrs)    Types: Cigarettes    Start date: 09/03/2013    Last attempt to quit: 02/24/2023    Years since quitting: 1.5   Smokeless tobacco: Never   Tobacco comments:    I start and stop, and mostly smoke on the weekends now; 12/04/23 - infrequently  Vaping Use   Vaping status: Never Used  Substance and Sexual Activity   Alcohol use: Yes    Alcohol/week: 24.0 standard drinks of alcohol    Types: 14 Glasses of wine, 10 Shots of liquor per week    Comment: 10 oz wine a day   Drug use: No   Sexual activity: Yes    Partners: Male    Birth control/protection: Condom    Comment: accepted condoms  Other Topics Concern   Not on file  Social History Narrative   Psychologist, counselling, assist nursing with re-certifications, [facility].   Exercise - video programs.  Weight bearing exercise as well  03/2019      2-3 cup coffee daily    Social Drivers of Health   Financial Resource Strain: Low Risk  (08/05/2024)   Overall Financial Resource Strain (CARDIA)    Difficulty of Paying Living Expenses: Not very hard  Food Insecurity: No Food Insecurity (08/05/2024)   Hunger Vital Sign    Worried About Running Out of Food in the Last Year: Never true    Ran Out of Food in the Last Year: Never true  Transportation Needs: No Transportation Needs (08/05/2024)   PRAPARE - Administrator, Civil Service (Medical): No    Lack of Transportation (Non-Medical): No  Physical Activity: Insufficiently Active (08/05/2024)   Exercise Vital Sign    Days of Exercise per Week: 2 days    Minutes of Exercise per Session: 20 min  Stress: No Stress Concern Present (08/05/2024)   [name] of Occupational Health - Occupational Stress Questionnaire    Feeling of Stress: Not at all  Social Connections: Socially Isolated (08/05/2024)   Social Connection and Isolation Panel    Frequency of Communication with Friends and Family:  More than three times a week    Frequency of Social Gatherings with Friends and Family: Twice a week    Attends Religious Services: Never    Database Administrator or Organizations: No    Attends Club or  Organization Meetings: Not on file    Marital Status: Never married    Family History  Problem Relation Age of Onset   Diabetes Mother    Hyperlipidemia Mother    Hypertension Mother    Stroke Mother    Kidney disease Mother    Depression Mother    Drug abuse Mother    Obesity Mother    Vision loss Mother    Heart disease Father 35       died of MI   Diabetes Father    Sudden death Father    Alcoholism Father    Obesity Father    Early death Father    Hypertension Sister    Diabetes Sister        type 2   Early death Sister    Hypertension Maternal Grandmother    Stroke Maternal Grandmother    Vision loss Maternal Grandmother    Hypertension Maternal Grandfather    Cancer Paternal Grandmother        lung   Heart disease Paternal Uncle        several uncles died in 19s with MI   Hypertension Paternal Uncle    Heart disease Paternal Uncle        MI in 70s    Past Medical History:  Diagnosis Date   Alcohol abuse    Allergy    Shell fish   Anxiety    Back pain    Chest pain    Constipation    Family history of heart disease in male family member before age 73    Food allergy    High cholesterol    HIV infection (HCC) 2014   Hypertension 05/2013   Lactose intolerance    Low HDL (under 40)    Prediabetes    Seasonal allergic rhinitis    weather changes, spring/fall   Sickle cell trait    Sleep apnea     Past Surgical History:  Procedure Laterality Date   NO PAST SURGERIES  03/2019     Current Outpatient Medications on File Prior to Visit  Medication Sig Dispense Refill   amLODipine  (NORVASC ) 5 MG tablet Take 1 tablet (5 mg total) by mouth daily. 90 tablet 1   atorvastatin  (LIPITOR) 10 MG tablet Take 1 tablet (10 mg total) by mouth daily. 90 tablet 1    cabotegravir  & rilpivirine  ER (CABENUVA ) 600 & 900 MG/3ML injection Inject 1 kit into the muscle every 30 (thirty) days. 6 mL 1   losartan -hydrochlorothiazide  (HYZAAR ) 100-25 MG tablet Take 1 tablet by mouth daily. 90 tablet 1   metFORMIN  (GLUCOPHAGE ) 500 MG tablet Take 1 tablet (500 mg total) by mouth 2 (two) times daily with a meal. 180 tablet 1   Vitamin D , Ergocalciferol , (DRISDOL ) 1.25 MG (50000 UNIT) CAPS capsule Take 1 capsule (50,000 Units total) by mouth every 7 (seven) days. 16 capsule 0   [DISCONTINUED] rosuvastatin  (CRESTOR ) 10 MG tablet Take 1 tablet (10 mg total) by mouth at bedtime. (Patient not taking: Reported on 08/09/2024) 90 tablet 0   No current facility-administered medications on file prior to visit.    Allergies  Allergen Reactions   Other Anaphylaxis   Shellfish Allergy Anaphylaxis     DIAGNOSTIC DATA (LABS, IMAGING, TESTING) - I reviewed patient records, labs, notes, testing and imaging myself where available.  Lab Results  Component Value Date   WBC 7.1 02/09/2024   HGB 14.7 02/09/2024   HCT 44.0 02/09/2024   MCV 83.8 02/09/2024  PLT 251 02/09/2024      Component Value Date/Time   NA 139 08/09/2024 1228   K 3.8 08/09/2024 1228   CL 99 08/09/2024 1228   CO2 25 08/09/2024 1228   GLUCOSE 86 08/09/2024 1228   GLUCOSE 105 (H) 02/09/2024 1355   GLUCOSE 105 (H) 02/09/2024 1355   BUN 11 08/09/2024 1228   CREATININE 1.00 08/09/2024 1228   CREATININE 1.11 02/09/2024 1355   CREATININE 1.11 02/09/2024 1355   CALCIUM  9.8 08/09/2024 1228   PROT 7.7 02/09/2024 1355   PROT 7.7 02/09/2024 1355   PROT 7.9 12/04/2023 1254   ALBUMIN 5.0 12/04/2023 1254   AST 17 02/09/2024 1355   AST 17 02/09/2024 1355   ALT 32 02/09/2024 1355   ALT 32 02/09/2024 1355   ALKPHOS 88 12/04/2023 1254   BILITOT 0.4 02/09/2024 1355   BILITOT 0.4 02/09/2024 1355   BILITOT 0.3 12/04/2023 1254   GFRNONAA 86 01/01/2021 1123   GFRAA 100 01/01/2021 1123   Lab Results  Component  Value Date   CHOL 111 08/09/2024   HDL 43 08/09/2024   LDLCALC 53 08/09/2024   TRIG 72 08/09/2024   CHOLHDL 2.6 08/09/2024   Lab Results  Component Value Date   HGBA1C 6.2 (H) 12/04/2023   Lab Results  Component Value Date   VITAMINB12 408 05/06/2023   Lab Results  Component Value Date   TSH 1.230 03/06/2023    PHYSICAL EXAM:  Vitals:   09/20/24 1249  BP: 134/86  Pulse: 65  SpO2: 98%   No data found. Body mass index is 37.19 kg/m.   Wt Readings from Last 3 Encounters:  09/20/24 274 lb 3.2 oz (124.4 kg)  08/09/24 270 lb (122.5 kg)  03/02/24 280 lb (127 kg)     Ht Readings from Last 3 Encounters:  09/20/24 6' (1.829 m)  08/09/24 6' (1.829 m)  03/02/24 6' (1.829 m)      General: The patient is awake, alert and appears not in acute distress and groomed. Head: Normocephalic, atraumatic.  Neck is supple. Mallampati 1-2 elongated uvua, deeply red. ,  neck circumference:17 inches .   Nasal airflow is fully  patent.   Overbite Dwan is not seen.   He has irregular teeth.  Dental status:  biological  Cardiovascular:  Regular rate and cardiac rhythm by pulse, without distended neck veins. Respiratory: no shortness of breath  Skin:  Without evidence of ankle edema, or rash. Trunk: BMI is 37.2    NEUROLOGIC EXAM: The patient is awake and alert, oriented to place and time.   Memory subjective described as intact.  Attention span & concentration ability appears normal.   Speech is fluent,  without  dysarthria, dysphonia or aphasia.  Mood and affect are appropriate.   Neurological Examination: Mental Status: Intact. Language and speech are normal. No cognitive deficits. Cranial Nerves II-XII:  Intact. PERL. EOMI. VFF. No nystagmus.  No facial droop.  No ptosis.  Hearing is grossly intact bilaterally.  The tongue is normal and midline. Motor: Strengths are 5/5 throughout. Muscle bulk and tone are normal. No tremors.  Coordination: No ataxia or  dysmetria.  Sensory: Grossly intact throughout to all modalities. Reflexes: Normal and symmetric throughout.  No ankle clonus.  Gait and Station: Normal. Romberg's sign is absent.   ASSESSMENT AND PLAN :   43 y.o. year old male  here with:    1) Need for a HST to screen for sleep apnea , risk factors are  BMi, neck  size, and upper airway anatomy.  He  is I unsure if he continuous to snore,  has not woken himself up in a while.    He had last year high moring BP and this has  not normalized recently .   He has no moring headaches.   2) Occasional smoker.   3) History of pre- diabetes.  Last lab work was normal for HbA1c.  Plan: will order HST for apnea screening as a cause of high AM BP.    I would like to thank Georgina Speaks, [name] for allowing me to meet with this pleasant patient.   Sleep Clinic Patients are generally offered input on sleep hygiene, life style changes and how to improve compliance with medical treatment where applicable. Review and reiteration of good sleep hygiene measures is offered to any sleep clinic patient, be it in the first consultation or with any follow up visits.    Any patient with sleepiness should be cautioned not to drive, work at heights, or operate dangerous or heavy equipment when feeling tired or sleepy.      The patient will be seen in follow-up in the sleep clinic at [facility] for discussion of test results, sleep related symptoms and treatment compliance review, further management strategies, etc.   The referring provider will be notified of the test results.   The patient's condition requires frequent monitoring and adjustments in the treatment plan, reflecting the ongoing complexity of care.  This provider is the continuing focal point for all needed services for this condition.  After spending a total time of  30  minutes face to face and time for  history taking, physical and neurologic examination, review of laboratory studies,  personal  review of imaging studies, reports and results of other testing and review of referral information / records as far as provided in visit,   Electronically signed by: Dedra Gores, MD 09/20/2024 1:01 PM  [facility] Board certified by The [employer] of Sleep Medicine and Diplomate of the [employer] of Sleep Medicine. Board certified In Neurology through the ABPN, Fellow of the [employer] of Neu[employer].

## 2024-09-27 ENCOUNTER — Other Ambulatory Visit: Payer: Self-pay

## 2024-09-27 ENCOUNTER — Other Ambulatory Visit: Payer: Self-pay | Admitting: Pharmacist

## 2024-09-27 ENCOUNTER — Other Ambulatory Visit (HOSPITAL_COMMUNITY): Payer: Self-pay

## 2024-09-27 DIAGNOSIS — B20 Human immunodeficiency virus [HIV] disease: Secondary | ICD-10-CM

## 2024-09-27 MED ORDER — CABOTEGRAVIR & RILPIVIRINE ER 600 & 900 MG/3ML IM SUER
1.0000 | INTRAMUSCULAR | 5 refills | Status: AC
  Filled 2024-09-27: qty 6, 30d supply, fill #0
  Filled 2024-11-02: qty 6, 30d supply, fill #1

## 2024-09-27 NOTE — Progress Notes (Signed)
 Specialty Pharmacy Refill Coordination Note  Douglas [name] is a 43 y.o. male assessed today regarding refills of clinic administered specialty medication(s) Cabotegravir  & Rilpivirine  (CABENUVA )   Clinic requested Courier to Provider Office   Delivery date: 09/29/24   Verified address: [address] [place] [address]   Medication will be filled on 09/28/24.

## 2024-09-28 ENCOUNTER — Telehealth: Payer: Self-pay

## 2024-09-28 NOTE — Telephone Encounter (Signed)
 [facility] Patient Advocate Encounter  Patient's medications CABENUVA  have been couriered to [facility] from [facility] and will be administered at the patients appointment on 10/04/24.  Charmaine Sharps, CPhT Specialty Pharmacy Patient [facility] for Infectious Disease Phone: [phone] Fax:  [phone]

## 2024-10-01 ENCOUNTER — Ambulatory Visit (INDEPENDENT_AMBULATORY_CARE_PROVIDER_SITE_OTHER): Admitting: Neurology

## 2024-10-01 DIAGNOSIS — E66812 Obesity, class 2: Secondary | ICD-10-CM

## 2024-10-01 DIAGNOSIS — R0683 Snoring: Secondary | ICD-10-CM

## 2024-10-01 DIAGNOSIS — B20 Human immunodeficiency virus [HIV] disease: Secondary | ICD-10-CM

## 2024-10-01 DIAGNOSIS — G4733 Obstructive sleep apnea (adult) (pediatric): Secondary | ICD-10-CM

## 2024-10-01 DIAGNOSIS — R7303 Prediabetes: Secondary | ICD-10-CM

## 2024-10-04 ENCOUNTER — Ambulatory Visit: Admitting: Internal Medicine

## 2024-10-05 ENCOUNTER — Ambulatory Visit (INDEPENDENT_AMBULATORY_CARE_PROVIDER_SITE_OTHER): Admitting: Internal Medicine

## 2024-10-05 ENCOUNTER — Other Ambulatory Visit: Payer: Self-pay

## 2024-10-05 ENCOUNTER — Encounter: Payer: Self-pay | Admitting: Internal Medicine

## 2024-10-05 VITALS — BP 160/103 | HR 108 | Temp 99.3°F | Ht 72.0 in | Wt 270.0 lb

## 2024-10-05 DIAGNOSIS — F1011 Alcohol abuse, in remission: Secondary | ICD-10-CM

## 2024-10-05 DIAGNOSIS — B20 Human immunodeficiency virus [HIV] disease: Secondary | ICD-10-CM

## 2024-10-05 DIAGNOSIS — Z79899 Other long term (current) drug therapy: Secondary | ICD-10-CM

## 2024-10-05 DIAGNOSIS — Z23 Encounter for immunization: Secondary | ICD-10-CM

## 2024-10-05 DIAGNOSIS — Z87891 Personal history of nicotine dependence: Secondary | ICD-10-CM | POA: Diagnosis not present

## 2024-10-05 MED ORDER — CABOTEGRAVIR & RILPIVIRINE ER 600 & 900 MG/3ML IM SUER
1.0000 | Freq: Once | INTRAMUSCULAR | Status: AC
  Administered 2024-10-05: 1 via INTRAMUSCULAR

## 2024-10-05 NOTE — Progress Notes (Signed)
 [facility]     HPI: Douglas [name] is a 43 y.o.male presents for  Hiv on biktarvy . Misses 1 dose/month Date of diagnosis 2014, found + at plasma donation Today: tolerating cab, no new partenrs.  ART exposure stribilid Past OIs Risk factors: MSM Partners in last 2months 1, in the last 12 months1.  Anal sex receptivey, insertivey. Contraception none. HIV + partenr   Social: Occupation: [employer]. Housing: house, by self Support: sport and exercise psychologist,  Understanding of HIV: Etoh/drug/tobacco use: binge on weeknds/somkes ciggerets/ no Past Medical History:  Diagnosis Date   Alcohol abuse    Allergy    Shell fish   Anxiety    Back pain    Chest pain    Constipation    Family history of heart disease in male family member before age 37    Food allergy    High cholesterol    HIV infection (HCC) 2014   Hypertension 05/2013   Lactose intolerance    Low HDL (under 40)    Prediabetes    Seasonal allergic rhinitis    weather changes, spring/fall   Sickle cell trait    Sleep apnea     Past Surgical History:  Procedure Laterality Date   NO PAST SURGERIES  03/2019    Family History  Problem Relation Age of Onset   Diabetes Mother    Hyperlipidemia Mother    Hypertension Mother    Stroke Mother    Kidney disease Mother    Depression Mother    Drug abuse Mother    Obesity Mother    Vision loss Mother    Heart disease Father 47       died of MI   Diabetes Father    Sudden death Father    Alcoholism Father    Obesity Father    Early death Father    Hypertension Sister    Diabetes Sister        type 2   Early death Sister    Hypertension Maternal Grandmother    Stroke Maternal Grandmother    Vision loss Maternal Grandmother    Hypertension Maternal Grandfather    Cancer Paternal Grandmother        lung   Heart disease Paternal Uncle        several uncles died in 65s with MI   Hypertension Paternal Uncle    Heart disease Paternal  Uncle        MI in 80s   Current Outpatient Medications on File Prior to Visit  Medication Sig Dispense Refill   amLODipine  (NORVASC ) 5 MG tablet Take 1 tablet (5 mg total) by mouth daily. 90 tablet 1   atorvastatin  (LIPITOR) 10 MG tablet Take 1 tablet (10 mg total) by mouth daily. 90 tablet 1   cabotegravir  & rilpivirine  ER (CABENUVA ) 600 & 900 MG/3ML injection Inject 1 kit into the muscle every 2 (two) months. 6 mL 5   losartan -hydrochlorothiazide  (HYZAAR ) 100-25 MG tablet Take 1 tablet by mouth daily. 90 tablet 1   metFORMIN  (GLUCOPHAGE ) 500 MG tablet Take 1 tablet (500 mg total) by mouth 2 (two) times daily with a meal. 180 tablet 1   Vitamin D , Ergocalciferol , (DRISDOL ) 1.25 MG (50000 UNIT) CAPS capsule Take 1 capsule (50,000 Units total) by mouth every 7 (seven) days. 16 capsule 0   [DISCONTINUED] rosuvastatin  (CRESTOR ) 10 MG tablet Take 1 tablet (10 mg total) by mouth at bedtime. (Patient not taking: Reported  on 08/09/2024) 90 tablet 0   No current facility-administered medications on file prior to visit.    Allergies  Allergen Reactions   Other Anaphylaxis   Shellfish Allergy Anaphylaxis      Lab Results HIV 1 RNA Quant  Date Value  08/05/2024 NOT DETECTED copies/mL  02/09/2024 NOT DETECTED copies/mL  07/21/2023 Not Detected Copies/mL   CD4 T Cell Abs (/uL)  Date Value  07/21/2023 746  06/20/2022 505  06/04/2021 578   No results found for: HIV1GENOSEQ Lab Results  Component Value Date   WBC 7.1 02/09/2024   HGB 14.7 02/09/2024   HCT 44.0 02/09/2024   MCV 83.8 02/09/2024   PLT 251 02/09/2024    Lab Results  Component Value Date   CREATININE 1.00 08/09/2024   BUN 11 08/09/2024   NA 139 08/09/2024   K 3.8 08/09/2024   CL 99 08/09/2024   CO2 25 08/09/2024   Lab Results  Component Value Date   ALT 32 02/09/2024   ALT 32 02/09/2024   AST 17 02/09/2024   AST 17 02/09/2024   ALKPHOS 88 12/04/2023   BILITOT 0.4 02/09/2024   BILITOT 0.4 02/09/2024    Lab  Results  Component Value Date   CHOL 111 08/09/2024   TRIG 72 08/09/2024   HDL 43 08/09/2024   LDLCALC 53 08/09/2024   Lab Results  Component Value Date   HAV POS (A) 06/17/2013   Lab Results  Component Value Date   HEPBSAG NEGATIVE 06/17/2013   HEPBSAB POS (A) 06/17/2013   Lab Results  Component Value Date   HCVAB NEGATIVE 06/17/2013   Lab Results  Component Value Date   CHLAMYDIAWP Negative 08/05/2024   CHLAMYDIAWP Negative 08/05/2024   CHLAMYDIAWP Negative 08/05/2024   N Negative 08/05/2024   N Negative 08/05/2024   N Negative 08/05/2024   No results found for: GCPROBEAPT No results found for: QUANTGOLD  Assessment/Plan #HIV -[id number] on 8/24, VLND, on 08/05/24 on cab-started 07/07/24.  Plan Continue Cabenuva  HIV labs from 9/11//25 reviewed F/u in a year, continue to see pharmacy for cab shots   #Vaccination COVID-today Flu 08/05/24 Monkeypox PCV 20 04/19/21 Meningitis 01/16/2017 HepA 2014 immune HEpB 2014x2->cab adnsab+ Tdap needs(06/10/23) Shingles   #Hx of tobacco abuse #Hx of Etoh abuse   #Health maintenance -Quantiferon nr 07/21/23 -RPR nr on 08/05/24, hx of syphilllis sp PEN -HCV today 07/21/23 -GC urine negative 08/05/24 -Lipid The 10-year ASCVD risk score (Arnett DK, et al., 2019) is: 10.9%   Values used to calculate the score:     Age: 66 years     Sex: Male     Is Non-Hispanic African American: Yes     Diabetic: No     Tobacco smoker: Yes     Systolic Blood Pressure: 135 mmHg     Is BP treated: Yes     HDL Cholesterol: 42 mg/dL     Total Cholesterol: 174 mg/dL    On atorvastatin  -Dysplasia screen M->today -GC urine negative 02/09/24   Loney Stank, MD [facility] I have personally spent 41 minutes involved in face-to-face and non-face-to-face activities for this patient on the day of the visit. Professional time spent includes the following activities: Preparing to see the patient  (review of tests), Obtaining and/or reviewing separately obtained history (admission/discharge record), Performing a medically appropriate examination and/or evaluation , Ordering medications/tests/procedures, referring and communicating with other health care professionals, Documenting clinical information in the EMR, Independently interpreting results (not  separately reported), Communicating results to the patient/family/caregiver, Counseling and educating the patient/family/caregiver and Care coordination (not separately reported).

## 2024-10-06 LAB — T-HELPER CELLS (CD4) COUNT (NOT AT ARMC)
CD4 % Helper T Cell: 47 % (ref 33–65)
CD4 T Cell Abs: 508 /uL (ref 400–1790)

## 2024-10-07 LAB — CBC WITH DIFFERENTIAL/PLATELET
Absolute Lymphocytes: 1005 {cells}/uL (ref 850–3900)
Absolute Monocytes: 742 {cells}/uL (ref 200–950)
Basophils Absolute: 32 {cells}/uL (ref 0–200)
Basophils Relative: 0.5 %
Eosinophils Absolute: 102 {cells}/uL (ref 15–500)
Eosinophils Relative: 1.6 %
HCT: 46 % (ref 38.5–50.0)
Hemoglobin: 14.9 g/dL (ref 13.2–17.1)
MCH: 27.3 pg (ref 27.0–33.0)
MCHC: 32.4 g/dL (ref 32.0–36.0)
MCV: 84.2 fL (ref 80.0–100.0)
MPV: 10.6 fL (ref 7.5–12.5)
Monocytes Relative: 11.6 %
Neutro Abs: 4518 {cells}/uL (ref 1500–7800)
Neutrophils Relative %: 70.6 %
Platelets: 266 Thousand/uL (ref 140–400)
RBC: 5.46 Million/uL (ref 4.20–5.80)
RDW: 13.1 % (ref 11.0–15.0)
Total Lymphocyte: 15.7 %
WBC: 6.4 Thousand/uL (ref 3.8–10.8)

## 2024-10-07 LAB — HIV-1 RNA QUANT-NO REFLEX-BLD
HIV 1 RNA Quant: NOT DETECTED {copies}/mL
HIV-1 RNA Quant, Log: NOT DETECTED {Log_copies}/mL

## 2024-10-07 LAB — COMPLETE METABOLIC PANEL WITHOUT GFR
AG Ratio: 1.8 (calc) (ref 1.0–2.5)
ALT: 33 U/L (ref 9–46)
AST: 16 U/L (ref 10–40)
Albumin: 4.9 g/dL (ref 3.6–5.1)
Alkaline phosphatase (APISO): 59 U/L (ref 36–130)
BUN: 12 mg/dL (ref 7–25)
CO2: 28 mmol/L (ref 20–32)
Calcium: 10 mg/dL (ref 8.6–10.3)
Chloride: 103 mmol/L (ref 98–110)
Creat: 0.99 mg/dL (ref 0.60–1.29)
Globulin: 2.8 g/dL (ref 1.9–3.7)
Glucose, Bld: 123 mg/dL — ABNORMAL HIGH (ref 65–99)
Potassium: 3.7 mmol/L (ref 3.5–5.3)
Sodium: 142 mmol/L (ref 135–146)
Total Bilirubin: 0.3 mg/dL (ref 0.2–1.2)
Total Protein: 7.7 g/dL (ref 6.1–8.1)

## 2024-10-15 NOTE — Progress Notes (Unsigned)
 SABRA

## 2024-10-17 ENCOUNTER — Ambulatory Visit: Payer: Self-pay | Admitting: Neurology

## 2024-10-17 DIAGNOSIS — R7303 Prediabetes: Secondary | ICD-10-CM

## 2024-10-17 DIAGNOSIS — B20 Human immunodeficiency virus [HIV] disease: Secondary | ICD-10-CM

## 2024-10-17 DIAGNOSIS — R0683 Snoring: Secondary | ICD-10-CM

## 2024-10-17 DIAGNOSIS — G4733 Obstructive sleep apnea (adult) (pediatric): Secondary | ICD-10-CM

## 2024-10-17 DIAGNOSIS — I10 Essential (primary) hypertension: Secondary | ICD-10-CM

## 2024-10-17 DIAGNOSIS — E66812 Obesity, class 2: Secondary | ICD-10-CM

## 2024-10-17 NOTE — Procedures (Signed)
 [facility]   HOME SLEEP TEST REPORT ( by Elene  mail -out device )    Study Protocol:     SANSA  is a chest-worn sensor and an FDA cleared and DOT approved type 4 home sleep test device - measures eight physiological channels,  including blood oxygen saturation (measured via PPG [photoplethysmography]), EKG-derived heart rate, respiratory effort, chest movement (measured via accelerometer), snoring, body position, and actigraphy. The device is designed to be worn for up to 10 hours per study.    STUDY DATE:  10-05-2024,  Data received : 10-15-2024   ORDERING CLINICIAN: Dedra Gores, MD  REFERRING CLINICIAN:  gaines Ada, NP    CLINICAL INFORMATION/HISTORY:Douglas [name] is a 43 y.o. male patient who is here for revisit 09/20/2024 for  obtaining a sleep test;.     Chief concern according to patient :   Discussed the use of AI scribe software for clinical note transcription with the patient, who gave verbal consent to proceed.   History of Present Illness   Douglas [name] is a 43 year old male who presents for a home sleep test.   He was initially referred for a home sleep test in 2024, but a change in health insurance shortly after the initial appointment prevented him from proceeding with the ordered tests at that time.   In August 2025, he contacted the office to inquire about the possibility of obtaining the home sleep test.       Chief concern according to patient :   I had apnea as a child age 34-6 , never had a tonsillectomy. It went away- I was a sleep walker, and I hold my breath now sometimes, as I gained weight  I am snoring  I some times wake up from snoring.  Sleep walking in childhood,  no Tonsillectomy, no thyroid  disease , no sinus disease, no TBI. Anti-retro-viral therapy.    Epworth sleepiness score: 3/ 24   FSS at 15/ 63    BMI:  37.2 kg/m  Neck Circumference: 17    FINDINGS:  Sleep Summary:   Beginning of recording Time  (hours, min): 10-05-2024 at 6 hours 28 minutes p.m.      Total Sleep Time (hours, min):   6 hours 27 minutes  Sleep efficiency %;      65%                                 Respiratory Indices (AHI ) by AASM criteria of scoring;    Calculated pAHI (per hour):    Total AHI was 20/h                                             Positional  respiratory activity  / snoring : Intermittent snoring is noted.   This patient had higher degrees of apnea while sleeping prone (!) .   Oxygen Saturation  in Sleep    Oxygen Saturation (%) Mean:   90%                O2 Saturation Range (%):    Between 74.4 and 99%, with a total time of desaturation of 109 minutes.  O2 Saturation (minutes) <89%: 109 minutes         Pulse Rate in Sleep :   Pulse Mean (bpm):     Mean pulse rate 76 bpm between 63 and 91 bpm  , and normal sinus rhythm.               IMPRESSION:  This HST confirms the presence of moderately severe obstructive sleep apnea, and that at baseline low mean oxygen saturation.  The patient spent 109 minutes in lower than 89% oxygenation, which is clinically very relevant. Based on this home sleep test the hypoxia is more concerning than the degree of apnea.   RECOMMENDATION: Any degree of sleep apnea that is associated with hypoxia cannot be treated with a sublingual device or dental device and requires usually positive airway pressure.  When the patient presents with such high degree of hypoxia we have to consider an underlying pulmonary or cardiologic disease-and I would certainly want Mr. Heier to see his all specialists if his primary care physician agrees.  From my perspective as a sleep physician I would order a return to the sleep lab for an in-lab titration which also allows us  to add oxygen during the titration should it be necessary.  There is a chance that the hypoxia will resolve under CPAP therapy or BiPAP therapy.  Since in-lab studies are booked through  the end of the calendar year I can also offer him an auto titration CPAP device to try at home and an overnight oximetry to be performed while using this device.  My preference would be a return for the in-lab CPAP-BiPAP titration with optional oxygen titration. I would appreciate the patient's input if he wants to proceed or prefers to start on an auto titration CPAP device at home first.  In addition, weight loss should be pursued (and the degree of apnea and the patient's BMI would qualify him by FDA criteria for Zepbound medication- which may unfortunately still not be covered by insurance).    Any Patient endorsing a high level of sleepiness should be cautioned not to drive, work at heights, or operate dangerous machinery or heavy equipment when tired or sleepy.  Review of good sleep hygiene measures took place in the initial consultation but should be revisited ( Your guide to better sleep  a publication by the NIH is a good source of information).   The referring provider will be notified of the test results.    I certify that I have reviewed the raw data recording prior to the issuance of this report in accordance with the standards of the American Academy of Sleep Medicine (AASM).    INTERPRETING PHYSICIAN:   Dedra Gores, MD  [facility] Sleep Board certified by The [employer] of Sleep Medicine and Diplomate of the [employer] of Sleep Medicine. Board certified In Neurology through the ABPN, Fellow of the [employer] of Neurology.

## 2024-10-18 ENCOUNTER — Encounter: Payer: Self-pay | Admitting: Neurology

## 2024-10-19 NOTE — Telephone Encounter (Signed)
 Patient is interested in starting autoPAP since we have availability in the sleep lab until after the new year.   Please place order for autoPAP. Thank you!

## 2024-10-19 NOTE — Telephone Encounter (Signed)
 Sent community message to [facility] about autopap order.

## 2024-10-26 ENCOUNTER — Other Ambulatory Visit: Payer: Self-pay | Admitting: Neurology

## 2024-10-26 DIAGNOSIS — R0602 Shortness of breath: Secondary | ICD-10-CM

## 2024-10-26 DIAGNOSIS — R0683 Snoring: Secondary | ICD-10-CM

## 2024-10-26 DIAGNOSIS — G4733 Obstructive sleep apnea (adult) (pediatric): Secondary | ICD-10-CM

## 2024-10-26 DIAGNOSIS — E66812 Obesity, class 2: Secondary | ICD-10-CM

## 2024-10-26 NOTE — Progress Notes (Signed)
 start on an auto titration CPAP device at home first.  ResMEd auto CPAP 6-16 cm water, 2 cm EPR and mask of comfort.

## 2024-11-02 ENCOUNTER — Other Ambulatory Visit: Payer: Self-pay

## 2024-11-02 ENCOUNTER — Other Ambulatory Visit (HOSPITAL_COMMUNITY): Payer: Self-pay

## 2024-11-02 NOTE — Progress Notes (Signed)
 Specialty Pharmacy Refill Coordination Note  Douglas [name] is a 43 y.o. male assessed today regarding refills of clinic administered specialty medication(s) Cabotegravir  & Rilpivirine  (CABENUVA )   Clinic requested Courier to Provider Office   Delivery date: 11/23/24   Verified address: [address] [place] [address]   Medication will be filled on 11/22/24.

## 2024-11-19 ENCOUNTER — Other Ambulatory Visit: Payer: Self-pay

## 2024-11-19 ENCOUNTER — Other Ambulatory Visit (HOSPITAL_BASED_OUTPATIENT_CLINIC_OR_DEPARTMENT_OTHER): Payer: Self-pay

## 2024-11-19 ENCOUNTER — Other Ambulatory Visit (HOSPITAL_COMMUNITY): Payer: Self-pay

## 2024-11-21 ENCOUNTER — Encounter: Payer: Self-pay | Admitting: Nurse Practitioner

## 2024-11-22 ENCOUNTER — Other Ambulatory Visit: Payer: Self-pay

## 2024-11-23 ENCOUNTER — Telehealth: Payer: Self-pay

## 2024-11-23 NOTE — Telephone Encounter (Signed)
 [facility] Patient Advocate Encounter  Patient's medications CABENUVA  have been couriered to [facility] from [facility] and will be administered at the patients appointment on 12/03/24.  Charmaine Sharps, CPhT Specialty Pharmacy Patient [facility] Phone: [phone] Fax:  [phone]

## 2024-11-29 ENCOUNTER — Other Ambulatory Visit (HOSPITAL_BASED_OUTPATIENT_CLINIC_OR_DEPARTMENT_OTHER): Payer: Self-pay

## 2024-11-30 ENCOUNTER — Ambulatory Visit: Admitting: Pharmacist

## 2024-11-30 ENCOUNTER — Other Ambulatory Visit: Payer: Self-pay

## 2024-11-30 ENCOUNTER — Ambulatory Visit (HOSPITAL_COMMUNITY)
Admission: EM | Admit: 2024-11-30 | Discharge: 2024-11-30 | Disposition: A | Discharge status: Home / Self Care | Attending: Family Medicine | Admitting: Family Medicine

## 2024-11-30 ENCOUNTER — Encounter (HOSPITAL_COMMUNITY): Payer: Self-pay | Admitting: *Deleted

## 2024-11-30 DIAGNOSIS — R051 Acute cough: Secondary | ICD-10-CM | POA: Diagnosis not present

## 2024-11-30 DIAGNOSIS — J101 Influenza due to other identified influenza virus with other respiratory manifestations: Secondary | ICD-10-CM

## 2024-11-30 LAB — POC SOFIA SARS ANTIGEN FIA: SARS Coronavirus 2 Ag: NEGATIVE

## 2024-11-30 LAB — POCT INFLUENZA A/B
Influenza A, POC: NEGATIVE
Influenza B, POC: POSITIVE — AB

## 2024-11-30 MED ORDER — HYDROCODONE BIT-HOMATROP MBR 5-1.5 MG/5ML PO SOLN: 5.0000 mL | Freq: Four times a day (QID) | ORAL | 0 refills | Status: AC | PRN

## 2024-11-30 NOTE — ED Triage Notes (Signed)
 PT reports since SAT he has had a cough and sore throat. Pt reports now his head hurts when he coughs.

## 2024-11-30 NOTE — Progress Notes (Signed)
 "  HPI: Douglas [name] is a 44 y.o. male who presents to the [facility] pharmacy clinic for Cabenuva  administration.  Referring ID Physician: Dr. Dennise  Patient Active Problem List   Diagnosis Date Noted   Class 2 obesity with body mass index (BMI) of 36.0 to 36.9 in adult 08/15/2024   Elevated liver enzymes 08/15/2024   Encounter for annual health examination 12/17/2023   Primary hypertension 09/14/2023   Need for influenza vaccination 09/14/2023   Need for Tdap vaccination 09/14/2023   Snoring 09/03/2023   At increased risk for cardiovascular disease 05/20/2023   Vitamin D  deficiency 05/20/2023   SOB (shortness of breath) on exertion 05/06/2023   Other fatigue 05/06/2023   Depression screen 08/26/2022   Prediabetes 08/26/2022   Anxiety 01/18/2021   Alcohol abuse 09/06/2020   Tobacco abuse 09/05/2020   Screening for prostate cancer 07/28/2020   Family history of heart disease 06/22/2019   Pure hypercholesterolemia 06/22/2019   Family history of premature CAD 04/14/2019   Obesity, Class II, BMI 35-39.9 01/01/2018   Seborrheic dermatitis 08/19/2013   Human immunodeficiency virus (HIV) disease (HCC) 06/25/2013    Patient's Medications  New Prescriptions   No medications on file  Previous Medications   AMLODIPINE  (NORVASC ) 5 MG TABLET    Take 1 tablet (5 mg total) by mouth daily.   ATORVASTATIN  (LIPITOR) 10 MG TABLET    Take 1 tablet (10 mg total) by mouth daily.   CABOTEGRAVIR  & RILPIVIRINE  ER (CABENUVA ) 600 & 900 MG/3ML INJECTION    Inject 1 kit into the muscle every 2 (two) months.   LOSARTAN -HYDROCHLOROTHIAZIDE  (HYZAAR ) 100-25 MG TABLET    Take 1 tablet by mouth daily.   METFORMIN  (GLUCOPHAGE ) 500 MG TABLET    Take 1 tablet (500 mg total) by mouth 2 (two) times daily with a meal.   VITAMIN D , ERGOCALCIFEROL , (DRISDOL ) 1.25 MG (50000 UNIT) CAPS CAPSULE    Take 1 capsule (50,000 Units total) by mouth every 7 (seven) days.  Modified Medications   No medications on file   Discontinued Medications   No medications on file    Allergies: Allergies[1]  Past Medical History: Past Medical History:  Diagnosis Date   Alcohol abuse    Allergy    Shell fish   Anxiety    Back pain    Chest pain    Constipation    Family history of heart disease in male family member before age 40    Food allergy    High cholesterol    HIV infection (HCC) 2014   Hypertension 05/2013   Lactose intolerance    Low HDL (under 40)    Prediabetes    Seasonal allergic rhinitis    weather changes, spring/fall   Sickle cell trait    Sleep apnea     Social History: Social History   Socioeconomic History   Marital status: Significant Other    Spouse name: Not on file   Number of children: Not on file   Years of education: Not on file   Highest education level: Some college, no degree  Occupational History   Occupation: patient Pensions Consultant: [employer]   Occupation: Hospital Doctor  Tobacco Use   Smoking status: Some Days    Current packs/day: 0.00    Average packs/day: 0.5 packs/day for 10.0 years (5.0 ttl pk-yrs)    Types: Cigarettes    Start date: 09/03/2013    Last attempt to quit: 02/24/2023    Years  since quitting: 1.7   Smokeless tobacco: Never   Tobacco comments:    I start and stop, and mostly smoke on the weekends now; 12/04/23 - infrequently  Vaping Use   Vaping status: Never Used  Substance and Sexual Activity   Alcohol use: Yes    Alcohol/week: 24.0 standard drinks of alcohol    Types: 14 Glasses of wine, 10 Shots of liquor per week    Comment: 10 oz wine a day   Drug use: No   Sexual activity: Yes    Partners: Male    Birth control/protection: Condom    Comment: accepted condoms  Other Topics Concern   Not on file  Social History Narrative   Psychologist, counselling, assist nursing with re-certifications, [facility].   Exercise - video programs.  Weight bearing exercise as well  03/2019      2-3 cup  coffee daily    Social Drivers of Health   Tobacco Use: High Risk (10/05/2024)   Patient History    Smoking Tobacco Use: Some Days    Smokeless Tobacco Use: Never    Passive Exposure: Not on file  Financial Resource Strain: Low Risk (08/05/2024)   Overall Financial Resource Strain (CARDIA)    Difficulty of Paying Living Expenses: Not very hard  Food Insecurity: No Food Insecurity (08/05/2024)   Epic    Worried About Programme Researcher, Broadcasting/film/video in the Last Year: Never true    Ran Out of Food in the Last Year: Never true  Transportation Needs: No Transportation Needs (08/05/2024)   Epic    Lack of Transportation (Medical): No    Lack of Transportation (Non-Medical): No  Physical Activity: Insufficiently Active (08/05/2024)   Exercise Vital Sign    Days of Exercise per Week: 2 days    Minutes of Exercise per Session: 20 min  Stress: No Stress Concern Present (08/05/2024)   [name] of Occupational Health - Occupational Stress Questionnaire    Feeling of Stress: Not at all  Social Connections: Socially Isolated (08/05/2024)   Social Connection and Isolation Panel    Frequency of Communication with Friends and Family: More than three times a week    Frequency of Social Gatherings with Friends and Family: Twice a week    Attends Religious Services: Never    Database Administrator or Organizations: No    Attends Engineer, Structural: Not on file    Marital Status: Never married  Depression (PHQ2-9): Low Risk (12/04/2023)   Depression (PHQ2-9)    PHQ-2 Score: 0  Alcohol Screen: Medium Risk (08/05/2024)   Alcohol Screen    Last Alcohol Screening Score (AUDIT): 13  Housing: Low Risk (08/05/2024)   Epic    Unable to Pay for Housing in the Last Year: No    Number of Times Moved in the Last Year: 0    Homeless in the Last Year: No  Utilities: Not on file  Health Literacy: Not on file    Labs: Lab Results  Component Value Date   HIV1RNAQUANT NOT DETECTED 10/05/2024    HIV1RNAQUANT NOT DETECTED 08/05/2024   HIV1RNAQUANT NOT DETECTED 02/09/2024   CD4TABS 508 10/05/2024   CD4TABS 746 07/21/2023   CD4TABS 505 06/20/2022    RPR and STI Lab Results  Component Value Date   LABRPR NON-REACTIVE 08/05/2024   LABRPR NON-REACTIVE 02/09/2024   LABRPR NON-REACTIVE 07/21/2023   LABRPR NON-REACTIVE 06/04/2021   LABRPR Non Reactive 07/28/2020   RPRTITER 1:1 06/17/2013  STI Results GC CT  08/05/2024  1:39 PM Negative    Negative    Negative  Negative    Negative    Negative   07/21/2023  4:20 PM Negative  Negative   06/20/2022  3:58 PM Negative  Negative   06/04/2021 11:15 AM Negative  Negative   01/01/2021 11:40 AM Negative  Negative   07/28/2020  1:13 PM  Negative   09/13/2015 12:00 AM Negative  Negative   09/06/2014 12:00 AM NG: Negative  CT: Negative     Hepatitis B Lab Results  Component Value Date   HEPBSAB POS (A) 06/17/2013   HEPBSAG NEGATIVE 06/17/2013   HEPBCAB POS (A) 06/17/2013   Hepatitis C Lab Results  Component Value Date   HEPCAB NON-REACTIVE 07/21/2023   Hepatitis A Lab Results  Component Value Date   HAV POS (A) 06/17/2013   Lipids: Lab Results  Component Value Date   CHOL 111 08/09/2024   TRIG 72 08/09/2024   HDL 43 08/09/2024   CHOLHDL 2.6 08/09/2024   VLDL 14 01/02/2017   LDLCALC 53 08/09/2024    TARGET DATE:  The 13th of the month  Assessment: Ebon presents today for their maintenance Cabenuva  injections. Initial/past injections were tolerated well without issues. No problems with systemic effects of injections.   Administered cabotegravir  600mg /70mL in left upper outer quadrant of the gluteal muscle. Administered rilpivirine  900 mg/3mL in the right upper outer quadrant of the gluteal muscle. Monitored patient for 10 minutes after injection. Injections were tolerated well without issue. Patient will follow up in 2 months for next injection. Will check HIV RNA today.  Eligible for 2/2 Shingles and 2/3 HPV  vaccines; accepts 2/3 HPV vaccine today and due for 3/3 HPV in ~12 weeks. Also requests full STI testing today.   Plan: - Administer Cabenuva  injections  - Check HIV RNA and RPR and urine/oral/rectal cytologies - Administer 2/3 HPV vaccine - Next injections scheduled for 3/6 and 5/8 with me  - Call with any issues or questions  Alan Geralds, PharmD, CPP, BCIDP, AAHIVP Clinical Pharmacist Practitioner Infectious Diseases Clinical Pharmacist [facility]      [1]  Allergies Allergen Reactions   Other Anaphylaxis   Shellfish Allergy Anaphylaxis   "

## 2024-12-01 NOTE — ED Provider Notes (Signed)
 " [facility]   [id number] 11/30/24 Arrival Time: 1927  ASSESSMENT & PLAN:  1. Acute cough   2. Influenza B    Work note provided. Discussed typical duration of viral illness.  Results for orders placed or performed during the hospital encounter of 11/30/24  POC SARS Coronavirus Ag   Collection Time: 11/30/24  8:06 PM  Result Value Ref Range   SARS Coronavirus 2 Ag Negative Negative  POC Influenza A/B   Collection Time: 11/30/24  8:09 PM  Result Value Ref Range   Influenza A, POC Negative Negative   Influenza B, POC Positive (A) Negative   OTC symptom care as needed.  Meds ordered this encounter  Medications   HYDROcodone  bit-homatropine (HYCODAN) 5-1.5 MG/5ML syrup    Sig: Take 5 mLs by mouth every 6 (six) hours as needed for cough.    Dispense:  90 mL    Refill:  0     Follow-up Information     Douglas Speaks, [name].   Specialty: General Practice Why: As needed. Contact information: [address] [place] [address] [phone]                 Reviewed expectations re: course of current medical issues. Questions answered. Outlined signs and symptoms indicating need for more acute intervention. Understanding verbalized. After Visit Summary given.   SUBJECTIVE: History from: Patient. Douglas [name] is a 44 y.o. male. PT reports since SAT he has had a cough and sore throat. Pt reports now his head hurts when he coughs. Denies: difficulty breathing. Normal PO intake without n/v/d.  OBJECTIVE:  Vitals:   11/30/24 1957  BP: (!) 145/82  Pulse: 100  Resp: 20  Temp: 100.3 F (37.9 C)  SpO2: 94%    General appearance: alert; no distress Eyes: PERRLA; EOMI; conjunctiva normal HENT: Douglas [name]; AT; with nasal congestion Neck: supple  Lungs: [name] full sentences without difficulty; unlabored Extremities: no edema Skin: warm and dry Neurologic: normal gait Psychological: alert and cooperative; normal mood and  affect  Labs: Results for orders placed or performed during the hospital encounter of 11/30/24  POC SARS Coronavirus Ag   Collection Time: 11/30/24  8:06 PM  Result Value Ref Range   SARS Coronavirus 2 Ag Negative Negative  POC Influenza A/B   Collection Time: 11/30/24  8:09 PM  Result Value Ref Range   Influenza A, POC Negative Negative   Influenza B, POC Positive (A) Negative   Labs Reviewed  POCT INFLUENZA A/B - Abnormal; Notable for the following components:      Result Value   Influenza B, POC Positive (*)    All other components within normal limits  POC SOFIA SARS ANTIGEN FIA    Imaging: No results found.  Allergies[1]  Past Medical History:  Diagnosis Date   Alcohol abuse    Allergy    Shell fish   Anxiety    Back pain    Chest pain    Constipation    Family history of heart disease in male family member before age 35    Food allergy    High cholesterol    HIV infection (HCC) 2014   Hypertension 05/2013   Lactose intolerance    Low HDL (under 40)    Prediabetes    Seasonal allergic rhinitis    weather changes, spring/fall   Sickle cell trait    Sleep apnea    Social History   Socioeconomic History   Marital status: Significant  Other    Spouse name: Not on file   Number of children: Not on file   Years of education: Not on file   Highest education level: Some college, no degree  Occupational History   Occupation: patient Pensions Consultant: [employer]   Occupation: Hospital Doctor  Tobacco Use   Smoking status: Some Days    Current packs/day: 0.00    Average packs/day: 0.5 packs/day for 10.0 years (5.0 ttl pk-yrs)    Types: Cigarettes    Start date: 09/03/2013    Last attempt to quit: 02/24/2023    Years since quitting: 1.7   Smokeless tobacco: Never   Tobacco comments:    I start and stop, and mostly smoke on the weekends now; 12/04/23 - infrequently  Vaping Use   Vaping status: Never Used  Substance and Sexual Activity    Alcohol use: Yes    Alcohol/week: 24.0 standard drinks of alcohol    Types: 14 Glasses of wine, 10 Shots of liquor per week    Comment: 10 oz wine a day   Drug use: No   Sexual activity: Yes    Partners: Male    Birth control/protection: Condom    Comment: accepted condoms  Other Topics Concern   Not on file  Social History Narrative   Psychologist, counselling, assist nursing with re-certifications, [employer] [facility].   Exercise - video programs.  Weight bearing exercise as well  03/2019      2-3 cup coffee daily    Social Drivers of Health   Tobacco Use: High Risk (11/30/2024)   Patient History    Smoking Tobacco Use: Some Days    Smokeless Tobacco Use: Never    Passive Exposure: Not on file  Financial Resource Strain: Low Risk (08/05/2024)   Overall Financial Resource Strain (CARDIA)    Difficulty of Paying Living Expenses: Not very hard  Food Insecurity: No Food Insecurity (08/05/2024)   Epic    Worried About Programme Researcher, Broadcasting/film/video in the Last Year: Never true    Ran Out of Food in the Last Year: Never true  Transportation Needs: No Transportation Needs (08/05/2024)   Epic    Lack of Transportation (Medical): No    Lack of Transportation (Non-Medical): No  Physical Activity: Insufficiently Active (08/05/2024)   Exercise Vital Sign    Days of Exercise per Week: 2 days    Minutes of Exercise per Session: 20 min  Stress: No Stress Concern Present (08/05/2024)   [name] of Occupational Health - Occupational Stress Questionnaire    Feeling of Stress: Not at all  Social Connections: Socially Isolated (08/05/2024)   Social Connection and Isolation Panel    Frequency of Communication with Friends and Family: More than three times a week    Frequency of Social Gatherings with Friends and Family: Twice a week    Attends Religious Services: Never    Database Administrator or Organizations: No    Attends Engineer, Structural: Not on file    Marital Status:  Never married  Intimate Partner Violence: Not on file  Depression (PHQ2-9): Low Risk (12/04/2023)   Depression (PHQ2-9)    PHQ-2 Score: 0  Alcohol Screen: Medium Risk (08/05/2024)   Alcohol Screen    Last Alcohol Screening Score (AUDIT): 13  Housing: Low Risk (08/05/2024)   Epic    Unable to Pay for Housing in the Last Year: No    Number of Times Moved in  the Last Year: 0    Homeless in the Last Year: No  Utilities: Not on file  Health Literacy: Not on file   Family History  Problem Relation Age of Onset   Diabetes Mother    Hyperlipidemia Mother    Hypertension Mother    Stroke Mother    Kidney disease Mother    Depression Mother    Drug abuse Mother    Obesity Mother    Vision loss Mother    Heart disease Father 63       died of MI   Diabetes Father    Sudden death Father    Alcoholism Father    Obesity Father    Early death Father    Hypertension Sister    Diabetes Sister        type 2   Early death Sister    Hypertension Maternal Grandmother    Stroke Maternal Grandmother    Vision loss Maternal Grandmother    Hypertension Maternal Grandfather    Cancer Paternal Grandmother        lung   Heart disease Paternal Uncle        several uncles died in 59s with MI   Hypertension Paternal Uncle    Heart disease Paternal Uncle        MI in 67s   Past Surgical History:  Procedure Laterality Date   NO PAST SURGERIES  03/2019      [1]  Allergies Allergen Reactions   Other Anaphylaxis   Shellfish Allergy Anaphylaxis     Rolinda Rogue, MD 12/01/24 1012  "

## 2024-12-03 ENCOUNTER — Other Ambulatory Visit: Payer: Self-pay

## 2024-12-03 ENCOUNTER — Ambulatory Visit: Admitting: Pharmacist

## 2024-12-03 DIAGNOSIS — B20 Human immunodeficiency virus [HIV] disease: Secondary | ICD-10-CM

## 2024-12-03 DIAGNOSIS — Z23 Encounter for immunization: Secondary | ICD-10-CM

## 2024-12-03 DIAGNOSIS — Z113 Encounter for screening for infections with a predominantly sexual mode of transmission: Secondary | ICD-10-CM

## 2024-12-03 MED ORDER — CABOTEGRAVIR & RILPIVIRINE ER 600 & 900 MG/3ML IM SUER
1.0000 | Freq: Once | INTRAMUSCULAR | Status: AC
  Administered 2024-12-03: 1 via INTRAMUSCULAR

## 2024-12-04 LAB — C. TRACHOMATIS/N. GONORRHOEAE RNA
C. trachomatis RNA, TMA: NOT DETECTED
N. gonorrhoeae RNA, TMA: NOT DETECTED

## 2024-12-04 LAB — GC/CHLAMYDIA PROBE, AMP (THROAT)
Chlamydia trachomatis RNA: NOT DETECTED
Neisseria gonorrhoeae RNA: NOT DETECTED

## 2024-12-04 LAB — CT/NG RNA, TMA RECTAL
Chlamydia Trachomatis RNA: NOT DETECTED
Neisseria Gonorrhoeae RNA: NOT DETECTED

## 2024-12-07 ENCOUNTER — Encounter: Payer: Self-pay | Admitting: Nurse Practitioner

## 2024-12-07 LAB — SYPHILIS: RPR W/REFLEX TO RPR TITER AND TREPONEMAL ANTIBODIES, TRADITIONAL SCREENING AND DIAGNOSIS ALGORITHM: RPR Ser Ql: NONREACTIVE

## 2024-12-07 LAB — HIV-1 RNA QUANT-NO REFLEX-BLD
HIV 1 RNA Quant: NOT DETECTED {copies}/mL
HIV-1 RNA Quant, Log: NOT DETECTED {Log_copies}/mL

## 2025-01-10 ENCOUNTER — Ambulatory Visit: Admitting: Primary Care

## 2025-01-19 ENCOUNTER — Encounter: Admitting: Adult Health

## 2025-01-28 ENCOUNTER — Ambulatory Visit: Payer: Self-pay | Admitting: Pharmacist

## 2025-03-21 ENCOUNTER — Encounter: Payer: Self-pay | Admitting: Nurse Practitioner

## 2025-04-01 ENCOUNTER — Ambulatory Visit: Payer: Self-pay | Admitting: Pharmacist
